# Patient Record
Sex: Female | Born: 1965 | Race: White | Hispanic: No | Marital: Married | Smoking: Former smoker
Health system: Southern US, Community
[De-identification: ages and names within clinical notes are randomized; demographics above are authoritative.]

## PROBLEM LIST (undated history)

## (undated) DIAGNOSIS — D649 Anemia, unspecified: Secondary | ICD-10-CM

## (undated) DIAGNOSIS — E611 Iron deficiency: Secondary | ICD-10-CM

## (undated) DIAGNOSIS — G4733 Obstructive sleep apnea (adult) (pediatric): Secondary | ICD-10-CM

## (undated) DIAGNOSIS — K635 Polyp of colon: Secondary | ICD-10-CM

## (undated) DIAGNOSIS — N200 Calculus of kidney: Secondary | ICD-10-CM

## (undated) DIAGNOSIS — G4730 Sleep apnea, unspecified: Secondary | ICD-10-CM

## (undated) DIAGNOSIS — K529 Noninfective gastroenteritis and colitis, unspecified: Secondary | ICD-10-CM

## (undated) DIAGNOSIS — E669 Obesity, unspecified: Secondary | ICD-10-CM

## (undated) DIAGNOSIS — K469 Unspecified abdominal hernia without obstruction or gangrene: Secondary | ICD-10-CM

## (undated) DIAGNOSIS — I10 Essential (primary) hypertension: Secondary | ICD-10-CM

## (undated) DIAGNOSIS — J45990 Exercise induced bronchospasm: Secondary | ICD-10-CM

## (undated) DIAGNOSIS — Z9989 Dependence on other enabling machines and devices: Secondary | ICD-10-CM

## (undated) DIAGNOSIS — E785 Hyperlipidemia, unspecified: Secondary | ICD-10-CM

## (undated) HISTORY — DX: Obstructive sleep apnea (adult) (pediatric): G47.33

## (undated) HISTORY — DX: Noninfective gastroenteritis and colitis, unspecified: K52.9

## (undated) HISTORY — DX: Dependence on other enabling machines and devices: Z99.89

## (undated) HISTORY — PX: COLONOSCOPY: SHX174

## (undated) HISTORY — DX: Polyp of colon: K63.5

## (undated) HISTORY — DX: Iron deficiency: E61.1

## (undated) HISTORY — DX: Obesity, unspecified: E66.9

## (undated) HISTORY — DX: Hyperlipidemia, unspecified: E78.5

## (undated) HISTORY — PX: ABDOMINOPLASTY: SUR9

## (undated) HISTORY — PX: HERNIA REPAIR: SHX51

## (undated) HISTORY — PX: TONSILECTOMY, ADENOIDECTOMY, BILATERAL MYRINGOTOMY AND TUBES: SHX2538

## (undated) HISTORY — DX: Essential (primary) hypertension: I10

## (undated) HISTORY — DX: Exercise induced bronchospasm: J45.990

## (undated) HISTORY — DX: Unspecified abdominal hernia without obstruction or gangrene: K46.9

## (undated) HISTORY — PX: ABLATION: SHX5711

## (undated) HISTORY — DX: Calculus of kidney: N20.0

## (undated) HISTORY — DX: Sleep apnea, unspecified: G47.30

## (undated) HISTORY — DX: Anemia, unspecified: D64.9

---

## 1999-11-29 ENCOUNTER — Other Ambulatory Visit: Admission: RE | Admit: 1999-11-29 | Discharge: 1999-11-29 | Payer: Self-pay | Admitting: Obstetrics and Gynecology

## 2000-08-03 ENCOUNTER — Encounter: Payer: Self-pay | Admitting: Family Medicine

## 2000-08-03 ENCOUNTER — Encounter: Admission: RE | Admit: 2000-08-03 | Discharge: 2000-08-03 | Payer: Self-pay | Admitting: Family Medicine

## 2001-02-05 ENCOUNTER — Other Ambulatory Visit: Admission: RE | Admit: 2001-02-05 | Discharge: 2001-02-05 | Payer: Self-pay | Admitting: Obstetrics and Gynecology

## 2001-03-07 ENCOUNTER — Encounter: Payer: Self-pay | Admitting: Obstetrics and Gynecology

## 2001-03-07 ENCOUNTER — Ambulatory Visit (HOSPITAL_COMMUNITY): Admission: RE | Admit: 2001-03-07 | Discharge: 2001-03-07 | Payer: Self-pay | Admitting: Obstetrics and Gynecology

## 2001-04-24 ENCOUNTER — Encounter (INDEPENDENT_AMBULATORY_CARE_PROVIDER_SITE_OTHER): Payer: Self-pay | Admitting: Specialist

## 2001-04-24 ENCOUNTER — Ambulatory Visit (HOSPITAL_COMMUNITY): Admission: RE | Admit: 2001-04-24 | Discharge: 2001-04-24 | Payer: Self-pay | Admitting: Obstetrics and Gynecology

## 2002-05-07 ENCOUNTER — Other Ambulatory Visit: Admission: RE | Admit: 2002-05-07 | Discharge: 2002-05-07 | Payer: Self-pay | Admitting: Obstetrics and Gynecology

## 2003-05-09 ENCOUNTER — Other Ambulatory Visit: Admission: RE | Admit: 2003-05-09 | Discharge: 2003-05-09 | Payer: Self-pay | Admitting: Obstetrics and Gynecology

## 2004-06-11 ENCOUNTER — Other Ambulatory Visit: Admission: RE | Admit: 2004-06-11 | Discharge: 2004-06-11 | Payer: Self-pay | Admitting: Obstetrics and Gynecology

## 2004-12-13 ENCOUNTER — Encounter (INDEPENDENT_AMBULATORY_CARE_PROVIDER_SITE_OTHER): Payer: Self-pay | Admitting: Specialist

## 2004-12-13 ENCOUNTER — Ambulatory Visit (HOSPITAL_COMMUNITY): Admission: RE | Admit: 2004-12-13 | Discharge: 2004-12-13 | Payer: Self-pay | Admitting: Specialist

## 2004-12-13 ENCOUNTER — Ambulatory Visit (HOSPITAL_BASED_OUTPATIENT_CLINIC_OR_DEPARTMENT_OTHER): Admission: RE | Admit: 2004-12-13 | Discharge: 2004-12-14 | Payer: Self-pay | Admitting: Specialist

## 2005-04-12 ENCOUNTER — Ambulatory Visit: Payer: Self-pay | Admitting: Family Medicine

## 2006-08-25 ENCOUNTER — Ambulatory Visit: Payer: Self-pay | Admitting: Pulmonary Disease

## 2006-09-21 ENCOUNTER — Ambulatory Visit: Payer: Self-pay | Admitting: Pulmonary Disease

## 2006-09-21 ENCOUNTER — Ambulatory Visit (HOSPITAL_BASED_OUTPATIENT_CLINIC_OR_DEPARTMENT_OTHER): Admission: RE | Admit: 2006-09-21 | Discharge: 2006-09-21 | Payer: Self-pay | Admitting: Pulmonary Disease

## 2006-10-17 ENCOUNTER — Ambulatory Visit: Payer: Self-pay | Admitting: Pulmonary Disease

## 2006-11-28 ENCOUNTER — Ambulatory Visit: Payer: Self-pay | Admitting: Pulmonary Disease

## 2006-12-01 DIAGNOSIS — I10 Essential (primary) hypertension: Secondary | ICD-10-CM | POA: Insufficient documentation

## 2007-03-19 ENCOUNTER — Ambulatory Visit: Payer: Self-pay | Admitting: Family Medicine

## 2007-03-19 DIAGNOSIS — J069 Acute upper respiratory infection, unspecified: Secondary | ICD-10-CM | POA: Insufficient documentation

## 2007-06-12 ENCOUNTER — Ambulatory Visit: Payer: Self-pay | Admitting: Pulmonary Disease

## 2007-06-12 DIAGNOSIS — J45990 Exercise induced bronchospasm: Secondary | ICD-10-CM | POA: Insufficient documentation

## 2007-06-12 DIAGNOSIS — G4733 Obstructive sleep apnea (adult) (pediatric): Secondary | ICD-10-CM | POA: Insufficient documentation

## 2009-09-01 ENCOUNTER — Ambulatory Visit: Payer: Self-pay | Admitting: Pulmonary Disease

## 2009-09-04 ENCOUNTER — Telehealth: Payer: Self-pay | Admitting: Pulmonary Disease

## 2009-09-24 ENCOUNTER — Encounter: Payer: Self-pay | Admitting: Pulmonary Disease

## 2009-10-13 ENCOUNTER — Ambulatory Visit: Payer: Self-pay | Admitting: Pulmonary Disease

## 2010-04-20 NOTE — Assessment & Plan Note (Signed)
Summary: rov//mbw   Visit Type:  Follow-up Primary Provider/Referring Provider:  Dr. Loree Fee, PA-C  CC:  OSA. Last seen 06/12/2007. The patient does c/o mask leakage and air blowing in her eyes.Marland Kitchen  History of Present Illness: I saw [name] in follow up for her OSA on CPAP 9, Exercise induce bronchospasm.  She has been doing okay with CPAP.  Her main problem has been air leaking from her mask and causing dryness in her eyes.  She has a nasal mask.  She is sleeping well, and feels refreshed in the morning.  She is no longer snoring.  She has been getting sluggish during the day, but was recently told she has iron deficient anemia.  She has noticed some more trouble with her breathing when she exercises.  She feels that weather has contributed to this.  She has a cough, clears her throat frequently, and occasional chest tightness.  She gets partial relief from her inhaler, but says when she gets winded it is sometimes difficult to use her inhaler.   Preventive Screening-Counseling & Management  Alcohol-Tobacco     Smoking Status: quit     Year Quit: 1986     Pack years: 2 years x1 ppd  Current Medications (verified): 1)  Lisinopril 20 Mg  Tabs (Lisinopril) .... Take 1 Tablet By Mouth Once A Day 2)  Hydrochlorothiazide 12.5 Mg Caps (Hydrochlorothiazide) .Marland Kitchen.. 1 By Mouth Daily 3)  Vitamin D 1000 Unit Tabs (Cholecalciferol) .... 2 By Mouth Daily 4)  Magnesium 250 Mg Tabs (Magnesium) .Marland Kitchen.. 1 By Mouth Daily 5)  Flax Seed Oil 1000 Mg Caps (Flaxseed (Linseed)) .Marland Kitchen.. 1 By Mouth Daily 6)  Red Yeast Rice 600 Mg Tabs (Red Yeast Rice Extract) .Marland Kitchen.. 1 By Mouth Daily  Allergies (verified): No Known Drug Allergies  Past History:  Past Medical History: Hypertension OSA Exercise induced bronchospasm  Family History: Reviewed history and no changes required. Patient is adopted---no known family history  Social History: Reviewed history and no changes required.  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  Smoked for 2 years, age [age], 1 ppd Smoking Status:  quit Pack years:  2 years x1 ppd  Vital Signs:  Patient profile:   45 year old female Height:      64 inches (162.56 cm) Weight:      259 pounds (117.73 kg) BMI:     44.62 O2 Sat:      97 % on Room air Temp:     98.6 degrees F (37.00 degrees C) oral Pulse rate:   97 / minute BP sitting:   126 / 72  (left arm) Cuff size:   large  Vitals Entered By: Michel Bickers CMA (September 01, 2009 4:46 PM)  O2 Sat at Rest %:  97 O2 Flow:  Room air CC: OSA. Last seen 06/12/2007. The patient does c/o mask leakage and air blowing in her eyes. Comments Medications reviewed. Daytime phone verified. Michel Bickers [facility]  September 01, 2009 4:50 PM   Physical Exam  General:  obese.   Nose:  clear nasal discharge.   Mouth:  no oral lesion Neck:  no JVD.   Lungs:  no wheeze Heart:  regular rhythm and normal rate.   Extremities:  no edema Cervical Nodes:  no significant adenopathy   Impression & Recommendations:  Problem # 1:  OBSTRUCTIVE SLEEP APNEA (ICD-327.23) She has been getting trouble from her mask.  Her refer her to the sleep lab to get her  mask fit checked.  Will also get a copy of her CPAP download.  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  Will refill her proair, and set her up for a home nebulizer.  Problem # 3:  HYPERTENSION (ICD-401.9) Some of her upper airway symptoms could be related to ACE inhibitor use.  I will stop her lisinopril, and start losartan 50 mg once daily.  Medications Added to Medication List This Visit: 1)  Hydrochlorothiazide 12.5 Mg Caps (Hydrochlorothiazide) .Marland Kitchen.. 1 by mouth daily 2)  Vitamin D 1000 Unit Tabs (Cholecalciferol) .... 2 by mouth daily 3)  Magnesium 250 Mg Tabs (Magnesium) .Marland Kitchen.. 1 by mouth daily 4)  Flax Seed Oil 1000 Mg Caps (Flaxseed (linseed)) .Marland Kitchen.. 1 by mouth daily 5)  Red Yeast Rice 600 Mg Tabs (Red  yeast rice extract) .Marland Kitchen.. 1 by mouth daily 6)  Losartan Potassium 50 Mg Tabs (Losartan potassium) .... One by mouth once daily 7)  Proair Hfa 108 (90 Base) Mcg/act Aers (Albuterol sulfate) .... Two puffs up to four times per day as needed 8)  Albuterol Sulfate (2.5 Mg/42ml) 0.083% Nebu (Albuterol sulfate) .... One vial nebulized up to four times per day as needed  Complete Medication List: 1)  Hydrochlorothiazide 12.5 Mg Caps (Hydrochlorothiazide) .Marland Kitchen.. 1 by mouth daily 2)  Vitamin D 1000 Unit Tabs (Cholecalciferol) .... 2 by mouth daily 3)  Magnesium 250 Mg Tabs (Magnesium) .Marland Kitchen.. 1 by mouth daily 4)  Flax Seed Oil 1000 Mg Caps (Flaxseed (linseed)) .Marland Kitchen.. 1 by mouth daily 5)  Red Yeast Rice 600 Mg Tabs (Red yeast rice extract) .Marland Kitchen.. 1 by mouth daily 6)  Losartan Potassium 50 Mg Tabs (Losartan potassium) .... One by mouth once daily 7)  Proair Hfa 108 (90 Base) Mcg/act Aers (Albuterol sulfate) .... Two puffs up to four times per day as needed 8)  Albuterol Sulfate (2.5 Mg/66ml) 0.083% Nebu (Albuterol sulfate) .... One vial nebulized up to four times per day as needed  Other Orders: Est. Patient Level IV (21308) DME Referral (DME) DME Referral (DME) Sleep Disorder Referral (Sleep Disorder)  Patient Instructions: 1)  Will set up nebulizer at home 2)  Use albuterol nebulized one vial up to four times per day as needed  3)  Proair two puffs up to four times per day as needed  4)  Stop lisinopril 5)  Start losartan 50 mg once daily for blood pressure 6)  Will arrange for mask fit evaluation at sleep lab 7)  Will get download from CPAP machine  8)  Follow up in 3 to 4 weeks Prescriptions: ALBUTEROL SULFATE (2.5 MG/3ML) 0.083% NEBU (ALBUTEROL SULFATE) one vial nebulized up to four times per day as needed  #120 x 3   Entered and Authorized by:   Coralyn Helling MD   Signed by:   Coralyn Helling MD on 09/01/2009   Method used:   Electronically to        [facility]* (retail)       [address]       [place], [place]  [id number]       Ph: [phone]       Fax: [phone]   RxID:   [id number] PROAIR HFA 108 (90 BASE) MCG/ACT AERS (ALBUTEROL SULFATE) two puffs up to four times per day as needed  #1 x 3   Entered and Authorized by:   Coralyn Helling MD   Signed by:   Coralyn Helling MD on 09/01/2009  Method used:   Electronically to        [facility]* (retail)       [address]       [place], [place]  [id number]       Ph: [phone]       Fax: [phone]   RxID:   [id number] LOSARTAN POTASSIUM 50 MG TABS (LOSARTAN POTASSIUM) one by mouth once daily  #30 x 3   Entered and Authorized by:   Coralyn Helling MD   Signed by:   Coralyn Helling MD on 09/01/2009   Method used:   Electronically to        [facility]* (retail)       [address]       [place], [place]  [id number]       Ph: [phone]       Fax: [phone]   RxID:   [id number]    Immunization History:  Influenza Immunization History:    Influenza:  historical (12/19/2008)   Prevention & Chronic Care Immunizations   Influenza vaccine: Historical  (12/19/2008)    Tetanus booster: Not documented    Pneumococcal vaccine: Not documented  Other Screening   Pap smear: Not documented    Mammogram: Not documented   Smoking status: quit  (09/01/2009)  Lipids   Total Cholesterol: Not documented   LDL: Not documented   LDL Direct: Not documented   HDL: Not documented   Triglycerides: Not documented  Hypertension   Last Blood Pressure: 126 / 72  (09/01/2009)   Serum creatinine: Not documented   Serum potassium Not documented  Self-Management Support :    Hypertension self-management support: Not documented

## 2010-04-20 NOTE — Miscellaneous (Signed)
Summary: autoCPAP download  Clinical Lists Changes used on 14 of 14 nights with average 8hrs 5 min per night.  Optimal pressure 9 cm H2O with average AHI 2.2.  Minimal leak.  Will continue on CPAP 9 cm H2O. Orders: Added new Referral order of DME Referral (DME) - Signed

## 2010-04-20 NOTE — Assessment & Plan Note (Signed)
Summary: rov 3-4 wks ///kp   Visit Type:  Follow-up Primary Provider/Referring Provider:  Dr. Loree Fee, PA-C  CC:  OSA.  Follow-up on CPAP titration study. .  History of Present Illness: I saw Ms. Boulay in follow up for her OSA on CPAP 9, Exercise induce bronchospasm.  Her breathing has been doing better.  She has not need to use her inhalers.  She is no longer having throat irritation since stopping ACE inhibitor.  She was recent diagnosed with low iron, vitamin D deficiency, and elevated blood sugars.  She was recently treated with a Zpak for a sinus infection.  She developed hives.  She is not sure what triggered this.  She has been on claritin, and this has improved her hives.   Current Medications (verified): 1)  Hydrochlorothiazide 12.5 Mg Caps (Hydrochlorothiazide) .Marland Kitchen.. 1 By Mouth Daily 2)  Magnesium 250 Mg Tabs (Magnesium) .Marland Kitchen.. 1 By Mouth Daily 3)  Flax Seed Oil 1000 Mg Caps (Flaxseed (Linseed)) .Marland Kitchen.. 1 By Mouth Daily 4)  Losartan Potassium 50 Mg Tabs (Losartan Potassium) .... One By Mouth Once Daily 5)  Proair Hfa 108 (90 Base) Mcg/act Aers (Albuterol Sulfate) .... Two Puffs Up To Four Times Per Day As Needed 6)  Albuterol Sulfate (2.5 Mg/43ml) 0.083% Nebu (Albuterol Sulfate) .... One Vial Nebulized Up To Four Times Per Day As Needed 7)  Loratadine 10 Mg Tabs (Loratadine) .... 2 By Mouth Daily  Allergies (verified): No Known Drug Allergies  Past History:  Past Medical History: Hypertension OSA      - CPAP 9 cm Exercise induced bronchospasm ACE inhibitor cough Glucose intolerance Vitamin D deficiency Iron deficiency  Vital Signs:  Patient profile:   45 year old female Height:      64 inches (162.56 cm) Weight:      261.38 pounds (118.81 kg) BMI:     45.03 O2 Sat:      97 % on Room air Temp:     97.4 degrees F (36.33 degrees C) oral Pulse rate:   84 / minute BP sitting:   140 / 78  (left arm) Cuff size:   large  Vitals Entered By: Michel Bickers CMA  (October 13, 2009 4:45 PM)  O2 Sat at Rest %:  97 O2 Flow:  Room air CC: OSA.  Follow-up on CPAP titration study.  Comments Medications reviewed with the patient. Daytime phone verifeid. Michel Bickers [facility]  October 13, 2009 4:46 PM   Physical Exam  General:  obese.   Nose:  clear nasal discharge.   Mouth:  no oral lesion Neck:  no JVD.   Lungs:  no wheeze Heart:  regular rhythm and normal rate.   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  Problem # 2:  OBSTRUCTIVE SLEEP APNEA (ICD-327.23)  Her recent CPAP download showed good control of her sleep apnea with CPAP 9 cm.  Problem # 3:  HYPERTENSION (ICD-401.9)  Her upper throat irritation has improved since stopping ACE inhibitor.  She is to follow up with primary care for further management of her blood pressure.  Medications Added to Medication List This Visit: 1)  Loratadine 10 Mg Tabs (Loratadine) .... 2 by mouth daily  Complete Medication List: 1)  Hydrochlorothiazide 12.5 Mg Caps (Hydrochlorothiazide) .Marland Kitchen.. 1 by mouth daily 2)  Magnesium 250 Mg Tabs (Magnesium) .Marland Kitchen.. 1 by mouth daily 3)  Flax Seed Oil 1000 Mg Caps (Flaxseed (linseed)) .Marland Kitchen.. 1 by mouth daily 4)  Losartan Potassium 50 Mg Tabs (Losartan potassium) .... One by mouth once daily 5)  Proair Hfa 108 (90 Base) Mcg/act Aers (Albuterol sulfate) .... Two puffs up to four times per day as needed 6)  Albuterol Sulfate (2.5 Mg/63ml) 0.083% Nebu (Albuterol sulfate) .... One vial nebulized up to four times per day as needed 7)  Loratadine 10 Mg Tabs (Loratadine) .... 2 by mouth daily  Other Orders: Est. Patient Level III (57846)  Patient Instructions: 1)  Follow up in 6 months

## 2010-04-20 NOTE — Progress Notes (Signed)
Summary: cpap download  Phone Note From Other Clinic Call back at [phone]   Caller: Buford Dresser, RT [facility] Call For: Dr. Craige Cotta Summary of Call: Mardelle Matte with [facility] called and stated that Mrs. Rupnow has a Therapist, music M-seriers unit and they are not Designer, industrial/product. They would be able to pull a compliance report only.  Mardelle Matte states that he has an Research scientist (life sciences) M-seriers that can be loaned to the patient for 2 or 3 weeks and them perform the download if this is acceptable.  If this is ok, I will need an order to" provide pt with a Loaner downloadable CPAP at pt's current pressure x (2 wks or however long you would like) download and fax." If you just want a compliance report that can be pulled off her machine. Please advise. Thanks, Initial call taken by: Alfonso Ramus,  September 04, 2009 11:54 AM  Follow-up for Phone Call        May be best to do auto cpap titration for 2 weeks instead.  Will send order for this. Follow-up by: Coralyn Helling MD,  September 05, 2009 9:34 AM     Appended Document: cpap download cpap auto range 5-20 cwp x 2 wks download to Dr. Craige Cotta faxed to [facility], Attn: Buford Dresser, RT

## 2010-04-22 ENCOUNTER — Other Ambulatory Visit: Payer: Self-pay | Admitting: Obstetrics and Gynecology

## 2010-08-03 NOTE — Assessment & Plan Note (Signed)
[facility]                             PULMONARY OFFICE NOTE   MARKEITA, Jenny Singleton                MRN:          [id number]  DATE:08/25/2006                            DOB:          03-13-1966    SLEEP CONSULTATION:  Patient is a self-referral.   I met Jenny Singleton today for evaluation of her sleep difficulties.  She  has been having problems with snoring for at least the last one year and  her husband says that things are getting worse.  She also complains of  feeling tired during the day.  She has an Epworth score of 10/24.  She  typically will go to bed between 10 and 11 o'clock at night, although  she often-times will fall asleep while watching TV before this.  She has  no trouble falling asleep.  She wakes up once or twice during the night  to use the bathroom and then goes back to sleep.  She wakes up at 7  o'clock in the morning, but still feels quite tired.  She denies having  headaches in the morning.  She tends to sleep more on her side.  She  will occasionally wake up with a coughing sensation and has had episodes  in which she has woken up feeling sweaty.  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  She does  have congestion in her nose and has tried using a BreatheRight strip to  help with her snoring, but did not get any benefit from this.  She has  no history of nightmares or night terrors.  There is no history of sleep-  walking or sleep-talking.  She denies any history of restless leg  syndrome.  There is also no history of sleep hallucinations, sleep  paralysis or cataplexy.  She is not currently using anything to help her  fall asleep a night or stay awake during the day.   PAST MEDICAL HISTORY:  Significant for hypertension.  She has also had  bronchospastic episodes, associated with episodes of bronchitis.   PAST SURGICAL HISTORY:  Significant for tonsillectomy,  oophorectomy  unilaterally and a tummy tuck.   CURRENT MEDICATIONS:  Lisinopril 20 mg daily.   ALLERGIES:  She has no known drug allergies.   SOCIAL HISTORY:  She is married, she has two children.  She works as an  Airline pilot.  She smoked for two years, one pack of cigarettes per day,  but quit at the age of 40.  There is no history of alcohol use.   FAMILY HISTORY:  The patient is adopted.   REVIEW OF SYSTEMS:  She complains of getting a rattling noise in her  chest with some chest tightness when she is exercising.  She says that  this happens usually when she has initiated her exercise regimen and she  is exercising at a fairly brisk pace.   PHYSICAL EXAM:  She is 5 feet 5 inches tall, 253 pounds, temperature is  99.3, blood pressure is 118/80, heart rate is 80, oxygen saturation is  96% on room air.  HEENT:  Pupils reactive.  Extraocular muscles are intact.  There is no  sinus tenderness.  She has narrow nasal angles.  She has a Mallampati 3  airway with a low-lying soft palate.  There is no lymphadenopathy, no  thyromegaly.  HEART:  S1, S2, regular rhythm.  CHEST:  Clear to auscultation.  ABDOMEN:  Soft, nontender, positive bowel sounds.  EXTREMITIES:  There was no edema, cyanosis or clubbing.  NEUROLOGIC EXAM:  No focal deficits were appreciated.   IMPRESSION:  1. Sleep disruption with excessive daytime sleepiness, combined with      snoring and a history of hypertension and obesity.  I am concerned      that she may have some degree of sleep disordered breathing.  To      further evaluate this, I will schedule her for an overnight      polysomnogram.  In the meantime, I have discussed with her the      importance of maintaining her diet, exercise and weight-reduction      program.  I have also reviewed with her the importance of avoiding      alcohol and sedatives.  Driving precautions were discussed with      her, as well.  I had also explained to her the diagnosis of  sleep      apnea and the possible association with sleep apnea and increased      risk of hypertension, coronary artery disease, cerebrovascular      disease, diabetes and arrhythmias.  2. Possible exercise-induced bronchospasm.  I discussed with her      techniques with regards to doing an adequate warm-up period before      initiating her high-intensity exercise regimen.  I had also      suggested to her that she try using her albuterol inhaler to see if      this improves her exercise tolerance.  Depending upon how      persistent these symptoms are and her response to the above      interventions, further evaluation for this may be necessary.   I will follow up with her after I have a chance to review her sleep  study.     Coralyn Helling, MD  Electronically Signed    VS/MedQ  DD: 08/25/2006  DT: 08/25/2006  Job #: [id number]

## 2010-08-03 NOTE — Procedures (Signed)
NAME:  Jenny Singleton, Jenny Singleton NO.:  [id number]   MEDICAL RECORD NO.:  [id number]          PATIENT TYPE:  OUT   LOCATION:  SLEEP CENTER                 [facility]AC[facility]   PHYSICIAN:  Coralyn Helling, MD        DATE OF BIRTH:  14-Nov-1965   DATE OF STUDY:  09/21/2006                            NOCTURNAL POLYSOMNOGRAM   REFERRING PHYSICIAN:  Coralyn Helling, MD   INDICATION FOR STUDY:  This is a patient who has a history of  hypertension, daytime fatigue and sleep disruption.  She is referred to  the sleep lab for evaluation of obstructive sleep apnea.   EPWORTH SLEEPINESS SCORE:  Is 8.   MEDICATIONS:  Lisinopril.   SLEEP ARCHITECTURE:  Total recording time is 355 minutes.  Total sleep  time was 263 minutes.  Sleep deficiency was 74%, which is reduced.  Sleep latency was 31 minutes, which is prolonged.  REM latency was 200  minutes, which is prolonged.  It appeared that the patient had  difficulty with sleep initiation and sleep maintenance due to  respiratory events.  The patient was observed in all stages of sleep,  but there was a reduction in the percentage of slow waves to 3% of the  study and REM sleep to 13% of the study.  The patient slept  predominantly in the non-supine position.   RESPIRATORY DATA:  The average respiratory rate was 18.  The overall  apnea/hypopnea index was 46.7.  The events were exclusively obstructive  in nature.  The REM apnea/hypopnea index was 19.  The non-REM  apnea/hypopnea index was 51.  The supine apnea/hypopnea index was 91.  The non-supine apnea/hypopnea index was 39.  Loud snoring was noted by  the technician.  The patient was scheduled for a split night study;  however, she did not meet protocol criteria due to insufficient sleep  during the first half of the study.   OXYGEN DATA:  The baseline oxygenation was 97%.  The oxygen saturation  Nadir was 82%.  The patient spent a total of 57 minutes with an oxygen  saturation to an 81%-90%.   The remainder of the test time was spent with  an oxygen saturation period of 91%.   CARDIAC DATA:  The average heart rate was 74 and the rhythm strip showed  a normal sinus rhythm with PACs.   MOVEMENT-PARASOMNIA:  The periodic limb movement index was 0.  The  patient was noted by the technician to be clenching her teeth at times  while she was asleep.   IMPRESSIONS-RECOMMENDATIONS:  This study shows evidence for severe  obstructive sleep apnea, as demonstrated by an apnea/hypopnea index of  46.7, with an oxygen saturation Nadir of 82%, in conjunction with  diet, exercise and weight loss.  A strong consideration should be given  to having the patient return to the sleep lab for a CPAP titration  study.      Coralyn Helling, MD  Diplomat, American Board of Sleep Medicine  Electronically Signed     VS/MEDQ  D:  10/04/2006 06:37:10  T:  10/04/2006 14:44:47  Job:  [id number]

## 2010-08-03 NOTE — Assessment & Plan Note (Signed)
[facility]                             PULMONARY OFFICE NOTE   Jenny Singleton, Jenny Singleton                     MRN:          [id number]  DATE:11/28/2006                            DOB:          August 23, 1965    I saw [name] in followup today for her severe obstructive sleep  apnea as well as her possible exercise-induced bronchospasm.   With regards to her sleep apnea, she had an auto CPAP titration study  done from August 4 to August 28, and she had 100% usage with an average  nightly usage of 6-1/2 hours. Her apnea/hypopnea index was reduced to  2.6, and her 90th percentile pressure setting was 9 cm of water, and she  had minimal air leak.   She is currently using a nasal mask with heated humidification and says  that she is doing quite well with this. She is having no troubles  sleeping at night, and she is also feeling increased energy level during  the day. She denies any problems as far as nasal congestion or dryness  or mouth dryness. She says she has to occasionally shift her mask in the  middle of the night, but this happens only sporadically.   She says her breathing is doing reasonably well. She has had several  episodes over the last few years around the change of the seasons with  Fall where she gets itchy eyes and watery eyes as well as nasal  congestion which then goes on to develop difficulty with her breathing  with the symptom description consistent with asthmatic bronchitis. What  I have advised for her to do with this is to use over-the-counter  antihistamines as needed as well as nasal irrigation, and then I have  advised her to contact me if her symptoms were to worsen to further  evaluate whether she would need to be on any inhaler agents. In  addition, I would decide then if she would need to have further  pulmonary functional testing done, but I do not think she would need to  have that done at the present time.   Otherwise, with regard to her sleep apnea, I will continue her on CPAP  at 9 cm of water, and I have encouraged her to maintain her compliance  with this. I have also encouraged her to maintain her compliance with  her exercise regimen with the hopes that if she is able to have  significant weight loss we will be able to modify her CPAP therapy.   I will follow up with her in about 4 to 6 months.     Coralyn Helling, MD  Electronically Signed    VS/MedQ  DD: 11/28/2006  DT: 11/29/2006  Job #: [id number]

## 2010-08-03 NOTE — Assessment & Plan Note (Signed)
[facility]                             PULMONARY OFFICE NOTE   EMRIE, GAYLE                     MRN:          [id number]  DATE:10/17/2006                            DOB:          07/22/1965    I saw Ms. Wikerson today in followup for her sleep apnea.   She had undergone an overnight polysomnogram on July 3rd and this showed  severe obstructive sleep apnea with an apnea-hypopnea index of 46.7 and  an oxygen saturation nadir of 82%. She did not meet the split-night  study protocol criteria due to insufficient sleep during the first half  of the test.   I have reviewed the results of the sleep study with her. I reviewed  various treatment options for her sleep apnea including CPAP therapy  with appliance and surgical intervention. Given the severity of her  sleep apnea, I would recommend CPAP therapy. I will therefore make  arrangements for her to undergo an auto CPAP titration study for 2  weeks. If she is still having difficulty after this then we will make  arrangements for her to have a in lab sleep CPAP titration study. In the  meantime, I have emphasized to her the importance of continuing with her  diet, exercise and weight reduction program.   At her followup I will also check to see if she is having any problems  with nasal congestion as well as to see if her breathing symptoms are  related to possible exercise-induced bronchospasm.   I will followup with her in approximately 6-8 weeks.     Coralyn Helling, MD  Electronically Signed    VS/MedQ  DD: 10/17/2006  DT: 10/18/2006  Job #: [id number]

## 2010-08-06 NOTE — Op Note (Signed)
[facility]  Patient:    Jenny Singleton, Jenny Singleton Visit Number: [id number] MRN: [id number]          Service Type: DSU Location: [facility] Attending Physician:  Osborn Coho Dictated by:   Janeece Riggers Dareen Piano, M.D. Proc. Date: 04/24/01 Admit Date:  04/24/2001 Discharge Date: 04/24/2001                             Operative Report  PREOPERATIVE DIAGNOSIS:       A 9 cm pelvic mass.  POSTOPERATIVE DIAGNOSIS:      A 9 cm pelvic mass with likely paratubal cyst.  OPERATION:                    Diagnostic laparoscopy with right                               salpingo-oophorectomy.  SURGEON:                      Mark E. Dareen Piano, M.D.  ASSISTANT:                    Luvenia Redden, M.D.  ANESTHESIA:                   General endotracheal.  ANTIBIOTICS:                  Ancef 1 g.  DRAINS:                       Red rubber catheter to bladder.  ESTIMATED BLOOD LOSS:         30 cc.  COMPLICATIONS:                None.  SPECIMENS:                    Right fallopian tube and ovary sent to                               pathology.  DESCRIPTION OF PROCEDURE:     The patient was taken to the operating room where she was placed in the dorsal supine position.  A general anesthetic was administered without complications.  She was then placed in the dorsolithotomy position and prepped with Hibiclens.  Her bladder was drained with a red rubber catheter.  A Hulka tenaculum was applied to the anterior cervical lip. Ten cubic centimeters of 0.25% Marcaine was then injected into the umbilicus. There was a vertical skin incision made in the umbilicus.  The fascia was then grasped and entered sharply.  The parietal peritoneum was entered bluntly; 0 Vicryl sutures were placed in the fascia, and the Hasson cannula was placed into the abdominal cavity.  Three liters of carbon dioxide was then insufflated.  Next, the scope was then placed.  A 5 mm port was then placed  in the suprapubic region under direct visualization.  At this point, the patient was noted to have a normal-appearing uterus.  Fallopian tubes were normal bilaterally.  The right ovary appeared to have a small 2 cm cyst in it consistent with a follicular cyst.  There was also a large 9 cm mass which was in the broad ligament with a question of  whether it was adherent to the ovary or not.  The left fallopian ovary was normal.  There was no evidence of pelvic adhesions or endometriosis.  The large cystic mass was in the pelvis causing the uterus to be deflected ________ the pelvis and anterior.  Once the pelvic mass was lifted, the Klepinger was used to cauterize the infundibulopelvic ligament, which was then transected.  The ureter was easily identified and far from the field of operation.  Next, the fallopian tube was cauterized and transected.  The ovarian ligament was cauterized and transected.  The large cyst was then punctured with an aspiration needle and 600 cc of clear fluid withdrawn.  Once the sac was deflated, the specimen was placed in the posterior cul-de-sac.  A 10 mm trocar was then placed in the suprapubic region.  An Endobag was placed in the abdominal cavity, and the large cyst was removed without difficulty.  A second Endobag was then placed into the abdominal cavity, and the ovary placed in the Endobag, and brought through the abdominal wall.  Once this was accomplished, the pelvis was copiously irrigated, and felt to be hemostatic.  This concluded the procedure.  The instruments were removed and pneumoperitoneum released, and the fascia closed with interrupted 0 Vicryl suture.  The skin was closed with 4-0 Vicryl suture in a subcuticular fashion. Steri-Strips were placed on the suprapubic incision.  The patient tolerated the procedure well.  She will be discharged to home with Demerol to take p.r.n. along with Phenergan and Keflex 500 mg q.i.d. for three days.  She  will follow up in the office in four weeks. Dictated by:   Janeece Riggers Dareen Piano, M.D. Attending Physician:  Osborn Coho DD:  04/25/01 TD:  04/26/01 Job: [id number]

## 2010-08-06 NOTE — Op Note (Signed)
NAME:  Jenny Singleton, Jenny Singleton         ACCOUNT NO.:  [id number]   MEDICAL RECORD NO.:  [id number]          PATIENT TYPE:  AMB   LOCATION:  DSC                          FACILITY:  [facility]   PHYSICIAN:  Earvin Hansen L. 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 She has had two children, has 4+  diastasis recti. She has lost approximately 60 pounds, is now being prepared  for panniculectomy with repair of diastasis recti, liposuction of the  ischial areas and upper abdomen, limited.   ANESTHESIA:  General.   Preoperatively, the patient was stood up and drawn for the midline of the  abdomen as well as the W-plasty incision. Lipodystrophy areas were also  outlined in circles. She then underwent general anesthesia and intubated  orally. Prep was done to the breast/belly/thigh/ischial areas, back, and  inguinal thigh areas with Hibiclens soap and solution, walled off with  sterile towels, and draped so as to make a sterile field. The patient  underwent general anesthesia, intubated orally. A Foley catheter was placed  in a sterile technique. Tumescent was injected throughout the whole areas of  dissection of 700 mL total. The incisions were opened with #15 blades and  the Bovie was used to go down through Scarpa's fashion to underlying areolar  tissue overlying the fascia. Vessels were coagulated with the Bovie unit on  coagulation. Next, a flap was brought all the way up to the umbilicus. The  umbilicus was excised and left on its pedicle. Then, the dissection was  carried up to the xiphoid process as well as the right and left upper  quadrants. Out laterally, tissue was also dissected. The perforators were  controlled with the Bovie unit on coagulation throughout the dissection.  After proper hemostasis, the patient was placed in a jackknife position,  excess skin trimmed appropriately on the large flaps from the lower belly  from the umbilicus down, removing over approximately 3000 g per side, with a  total of approximately 6000 g. After proper hemostasis, liposuction was then  fashioned in the ischial areas, limited to the upper abdomen and lower  abdomen to improve symmetry. Next, the diastasis recti was repaired with a  running #1 Prolene from the xiphoid process to the umbilicus, and from the  umbilicus down to the pubic area. After this, the flaps were closed in  layers with 2-0 Monocryl x2 layers in the subcutaneous plane, then a running  subcuticular stitch of 3-0 Monocryl to the skin. The new opening was made  for the umbilicus. That area was trimmed appropriately and it was brought  back through and secured with 2-0 Monocryl subcutaneously and a running 4-0  Prolene. The wounds were drained with a large Hemovac drain which was placed  through the pubic area in the right and left abdomen and secured with 3-0  Prolene. The wounds were cleansed. At the end of the procedure the blood  supply to all the  flaps looked great, there was no discoloration. Steri-  Strips and soft dressings were applied to all the areas. She withstood the  procedures very well. Abdominal binder was also placed after the dressings  were placed. She was then taken to recovery in excellent condition.   ESTIMATED BLOOD LOSS:  Less than 150 mL.   COMPLICATIONS:  None.      Jenny Singleton. Shon Hough, M.D.  Electronically Signed     GLT/MEDQ  D:  12/13/2004  T:  12/14/2004  Job:  [id number]

## 2010-08-17 ENCOUNTER — Other Ambulatory Visit: Payer: Self-pay | Admitting: Internal Medicine

## 2010-08-17 ENCOUNTER — Telehealth: Payer: Self-pay | Admitting: Pulmonary Disease

## 2010-08-17 DIAGNOSIS — G4733 Obstructive sleep apnea (adult) (pediatric): Secondary | ICD-10-CM

## 2010-08-17 DIAGNOSIS — R748 Abnormal levels of other serum enzymes: Secondary | ICD-10-CM

## 2010-08-17 NOTE — Telephone Encounter (Signed)
PT STATES SHE CONTACTED [facility] AND THEY TOLD HER SHE WAS DUE FOR A NEW CPAP MACHINE BECAUSE HERS IS  45 YEARS OLD PT WAS ALSO IF DR SOOD SAID THAT THIS WOULD BE AN ONGOING NEED THIS COULD BE PURCHASED THROUGH HER INSURANCE COMPANY INSTEAD OF A RENTAL. DR SOOD IF YOU AGREE PLS SEND ORDER TO [facility] FOR A NEW MACHINE WITH [facility].

## 2010-08-18 NOTE — Telephone Encounter (Signed)
Pt aware and appt set for 09-02-10 at 4:15.Carron Curie, CMA

## 2010-08-18 NOTE — Telephone Encounter (Signed)
Will send order to [facility] to arrange for new CPAP machine.  Will have my nurse call to inform patient of plan, and advise that she is overdue for follow up visit.  She will need to schedule ROV to review status of sleep apnea.

## 2010-08-19 ENCOUNTER — Ambulatory Visit
Admission: RE | Admit: 2010-08-19 | Discharge: 2010-08-19 | Disposition: A | Discharge status: Home / Self Care | Payer: 59 | Source: Ambulatory Visit | Attending: Internal Medicine | Admitting: Internal Medicine

## 2010-08-19 DIAGNOSIS — R748 Abnormal levels of other serum enzymes: Secondary | ICD-10-CM

## 2010-08-30 ENCOUNTER — Encounter: Payer: Self-pay | Admitting: Pulmonary Disease

## 2010-09-02 ENCOUNTER — Encounter: Payer: Self-pay | Admitting: Pulmonary Disease

## 2010-09-02 ENCOUNTER — Ambulatory Visit (INDEPENDENT_AMBULATORY_CARE_PROVIDER_SITE_OTHER): Payer: 59 | Admitting: Pulmonary Disease

## 2010-09-02 DIAGNOSIS — E669 Obesity, unspecified: Secondary | ICD-10-CM

## 2010-09-02 DIAGNOSIS — J45990 Exercise induced bronchospasm: Secondary | ICD-10-CM

## 2010-09-02 DIAGNOSIS — G4733 Obstructive sleep apnea (adult) (pediatric): Secondary | ICD-10-CM

## 2010-09-02 NOTE — Assessment & Plan Note (Signed)
Stable.  She can use her inhaler as needed.

## 2010-09-02 NOTE — Assessment & Plan Note (Signed)
Encouraged her to continue with her diet and exercise regimen.

## 2010-09-02 NOTE — Assessment & Plan Note (Signed)
She is doing well with CPAP.  

## 2010-09-02 NOTE — Patient Instructions (Signed)
Follow-up in one year.

## 2010-09-02 NOTE — Progress Notes (Signed)
Subjective:    Patient ID: Jenny Singleton, female    DOB: [birth year], 45 y.o.   MRN: [id number]  HPI 45 yo female with OSA, and exercise induced asthma.  She has been doing well with CPAP.  She got a new nasal mask, and likes this better.  She feels CPAP helps with her sleep and energy level.  She uses this on a regular basis.  She is not having any trouble with her asthma.  She has not needed to use her inhaler for several months.  She is exercising on a regular basis, but has not been able to lose much weight.  Past Medical History  Diagnosis Date  . Hypertension   . OSA on CPAP   . Vitamin D deficiency   . Iron deficiency   . Exercise-induced asthma   . Diabetes mellitus      History reviewed. No pertinent family history.   History   Social History  . Marital Status: Married    Spouse Name: N/A    Number of Children: N/A  . Years of Education: N/A   Occupational History  . Not on file.   Social History Main Topics  . Smoking status: Former Smoker -- 1.0 packs/day for 2 years    Quit date: 03/21/1985  . Smokeless tobacco: Not on file   Comment: smoked age 5-19  . Alcohol Use: Not on file  . Drug Use: Not on file  . Sexually Active: Not on file   Other Topics Concern  . Not on file   Social History Narrative  . No narrative on file     No Known Allergies   Outpatient Prescriptions Prior to Visit  Medication Sig Dispense Refill  . albuterol (PROAIR HFA) 108 (90 BASE) MCG/ACT inhaler Inhale 2 puffs into the lungs every 6 (six) hours as needed.        Marland Kitchen albuterol (PROVENTIL) (2.5 MG/3ML) 0.083% nebulizer solution Take 2.5 mg by nebulization every 6 (six) hours as needed.        . Flaxseed, Linseed, (FLAX SEED OIL) 1000 MG CAPS Take 1 capsule by mouth daily.        . hydrochlorothiazide (HYDRODIURIL) 12.5 MG tablet Take 12.5 mg by mouth daily.        Marland Kitchen loratadine (CLARITIN) 10 MG tablet Take 10 mg by mouth daily. As needed      . Magnesium 250 MG TABS Take 1  tablet by mouth daily.        Marland Kitchen losartan (COZAAR) 50 MG tablet Take 50 mg by mouth daily.            Review of Systems     Objective:   Physical Exam  BP 120/84  Pulse 91  Temp(Src) 98.4 F (36.9 C) (Oral)  Ht 5\' 4"  (1.626 m)  Wt 260 lb (117.935 kg)  BMI 44.63 kg/m2  SpO2 95%  General - Obese HEENT - no sinus tenderness, no oral lesion, no LAN Cardiac - s1s2 regular, no murmur Chest - no wheeze/rales Abd - soft, nontender Ext - no edema Neuro - normal strength, CN intact Psych - normal mood/behavior     Assessment & Plan:   OBSTRUCTIVE SLEEP APNEA She is doing well with CPAP.  Exercise induced bronchospasm Stable.  She can use her inhaler as needed.  Obesity Encouraged her to continue with her diet and exercise regimen.    Updated Medication List Outpatient Encounter Prescriptions as of 09/02/2010  Medication Sig Dispense Refill  . albuterol (  PROAIR HFA) 108 (90 BASE) MCG/ACT inhaler Inhale 2 puffs into the lungs every 6 (six) hours as needed.        Marland Kitchen albuterol (PROVENTIL) (2.5 MG/3ML) 0.083% nebulizer solution Take 2.5 mg by nebulization every 6 (six) hours as needed.        . Cholecalciferol (VITAMIN D) 2000 UNITS CAPS Take 1 capsule by mouth daily.        . ferrous sulfate 325 (65 FE) MG tablet Take 325 mg by mouth 2 (two) times daily.        . fish oil-omega-3 fatty acids 1000 MG capsule Take by mouth daily.        . Flaxseed, Linseed, (FLAX SEED OIL) 1000 MG CAPS Take 1 capsule by mouth daily.        . hydrochlorothiazide (HYDRODIURIL) 12.5 MG tablet Take 12.5 mg by mouth daily.        Marland Kitchen lisinopril (PRINIVIL,ZESTRIL) 20 MG tablet Take 20 mg by mouth daily.        Marland Kitchen loratadine (CLARITIN) 10 MG tablet Take 10 mg by mouth daily. As needed      . Magnesium 250 MG TABS Take 1 tablet by mouth daily.        . metFORMIN (GLUCOPHAGE) 500 MG tablet Take 500 mg by mouth 2 (two) times daily with a meal.        . milk thistle 175 MG tablet Take 175 mg by mouth 2  (two) times daily.        Marland Kitchen DISCONTD: losartan (COZAAR) 50 MG tablet Take 50 mg by mouth daily.

## 2011-03-03 ENCOUNTER — Other Ambulatory Visit: Payer: Self-pay | Admitting: Obstetrics and Gynecology

## 2011-08-03 ENCOUNTER — Ambulatory Visit (HOSPITAL_COMMUNITY)
Admission: RE | Admit: 2011-08-03 | Discharge: 2011-08-03 | Disposition: A | Discharge status: Home / Self Care | Payer: BC Managed Care – PPO | Source: Ambulatory Visit | Attending: Internal Medicine | Admitting: Internal Medicine

## 2011-08-03 ENCOUNTER — Other Ambulatory Visit (HOSPITAL_COMMUNITY): Payer: Self-pay | Admitting: Internal Medicine

## 2011-08-03 DIAGNOSIS — M25569 Pain in unspecified knee: Secondary | ICD-10-CM

## 2012-02-20 ENCOUNTER — Other Ambulatory Visit: Payer: Self-pay | Admitting: Internal Medicine

## 2012-02-20 DIAGNOSIS — R1011 Right upper quadrant pain: Secondary | ICD-10-CM

## 2012-02-23 ENCOUNTER — Ambulatory Visit
Admission: RE | Admit: 2012-02-23 | Discharge: 2012-02-23 | Disposition: A | Discharge status: Home / Self Care | Payer: BC Managed Care – PPO | Source: Ambulatory Visit | Attending: Internal Medicine | Admitting: Internal Medicine

## 2012-02-23 DIAGNOSIS — R1011 Right upper quadrant pain: Secondary | ICD-10-CM

## 2012-02-24 ENCOUNTER — Other Ambulatory Visit (HOSPITAL_COMMUNITY): Payer: Self-pay | Admitting: Internal Medicine

## 2012-02-24 DIAGNOSIS — R1011 Right upper quadrant pain: Secondary | ICD-10-CM

## 2012-03-02 ENCOUNTER — Encounter (HOSPITAL_COMMUNITY)
Admission: RE | Admit: 2012-03-02 | Discharge: 2012-03-02 | Disposition: A | Discharge status: Home / Self Care | Payer: BC Managed Care – PPO | Source: Ambulatory Visit | Attending: Internal Medicine | Admitting: Internal Medicine

## 2012-03-02 DIAGNOSIS — R1011 Right upper quadrant pain: Secondary | ICD-10-CM | POA: Insufficient documentation

## 2012-03-02 MED ORDER — SINCALIDE 5 MCG IJ SOLR
INTRAMUSCULAR | Status: AC
  Filled 2012-03-02: qty 5

## 2012-03-02 MED ORDER — TECHNETIUM TC 99M MEBROFENIN IV KIT
5.0000 | PACK | Freq: Once | INTRAVENOUS | Status: AC | PRN
  Administered 2012-03-02: 5 via INTRAVENOUS

## 2012-03-02 MED ORDER — SINCALIDE 5 MCG IJ SOLR
0.0200 ug/kg | Freq: Once | INTRAMUSCULAR | Status: AC
  Administered 2012-03-02: 5.68 ug via INTRAVENOUS

## 2012-03-02 MED ORDER — STERILE WATER FOR INJECTION IJ SOLN
10.0000 mL | Freq: Once | INTRAMUSCULAR | Status: AC
  Administered 2012-03-02: 10 mL via INTRAMUSCULAR
  Filled 2012-03-02: qty 10

## 2012-07-05 ENCOUNTER — Ambulatory Visit
Admission: RE | Admit: 2012-07-05 | Discharge: 2012-07-05 | Disposition: A | Discharge status: Home / Self Care | Payer: BC Managed Care – PPO | Source: Ambulatory Visit | Attending: Internal Medicine | Admitting: Internal Medicine

## 2012-07-05 ENCOUNTER — Other Ambulatory Visit: Payer: Self-pay | Admitting: Internal Medicine

## 2012-07-05 DIAGNOSIS — K469 Unspecified abdominal hernia without obstruction or gangrene: Secondary | ICD-10-CM

## 2012-07-05 MED ORDER — IOHEXOL 300 MG/ML  SOLN
125.0000 mL | Freq: Once | INTRAMUSCULAR | Status: AC | PRN
  Administered 2012-07-05: 125 mL via INTRAVENOUS

## 2012-07-09 ENCOUNTER — Telehealth: Payer: Self-pay | Admitting: Internal Medicine

## 2012-07-09 ENCOUNTER — Encounter: Payer: Self-pay | Admitting: *Deleted

## 2012-07-09 NOTE — Telephone Encounter (Signed)
Informed Darel Hong that pt may see Doug Sou, PA at 0830am tomorrow. Darel Hong will fax labs and ofc notes and inform the pt.

## 2012-07-10 ENCOUNTER — Other Ambulatory Visit: Payer: BC Managed Care – PPO

## 2012-07-10 ENCOUNTER — Encounter: Payer: Self-pay | Admitting: Gastroenterology

## 2012-07-10 ENCOUNTER — Ambulatory Visit (INDEPENDENT_AMBULATORY_CARE_PROVIDER_SITE_OTHER): Payer: BC Managed Care – PPO | Admitting: Gastroenterology

## 2012-07-10 VITALS — BP 128/76 | HR 92 | Ht 64.0 in | Wt 245.0 lb

## 2012-07-10 DIAGNOSIS — R109 Unspecified abdominal pain: Secondary | ICD-10-CM

## 2012-07-10 DIAGNOSIS — R140 Abdominal distension (gaseous): Secondary | ICD-10-CM

## 2012-07-10 DIAGNOSIS — K760 Fatty (change of) liver, not elsewhere classified: Secondary | ICD-10-CM

## 2012-07-10 DIAGNOSIS — D509 Iron deficiency anemia, unspecified: Secondary | ICD-10-CM

## 2012-07-10 DIAGNOSIS — R7401 Elevation of levels of liver transaminase levels: Secondary | ICD-10-CM

## 2012-07-10 DIAGNOSIS — R143 Flatulence: Secondary | ICD-10-CM

## 2012-07-10 DIAGNOSIS — K7689 Other specified diseases of liver: Secondary | ICD-10-CM

## 2012-07-10 DIAGNOSIS — R141 Gas pain: Secondary | ICD-10-CM

## 2012-07-10 MED ORDER — PEG-KCL-NACL-NASULF-NA ASC-C 100 G PO SOLR: 1.0000 | Freq: Once | ORAL | Status: DC

## 2012-07-10 NOTE — Progress Notes (Signed)
07/10/2012 Jenny Singleton [id number] [birth year]   HISTORY OF PRESENT ILLNESS:  Patient is a pleasant 47 year old female who presents to our office day as a new patient.  She was sent to our office by her PCP for evaluation of a few issues. First, she has elevated LFTs. She reports that they have been elevated for the past year or 2 to her knowledge. She has been taking milk thistle at the recommendation of her family doctor, and has been told that the elevation is secondary to her fatty liver disease. Her most recent LFTs that were drawn last week show a normal alkaline phosphatase at 45, a normal total bilirubin at 0.7, AST of 71, and ALT of 92. She had a CAT scan recently for evaluation of abdominal pain and bloating, which revealed an enlarged liver spanning over 23 cm with diffuse fatty infiltration and no focal lesions.  Otherwise, the CAT scan showed right paracentral anterior abdominal hernia just above the umbilicus containing fat and small vessels but no bowel. There were some increased number of normal size mesenteric lymph nodes that had been noted on previous imaging and etiologies/significant was indeterminate. Her acute hepatitis panel was negative. She states that the milk thistle must be helping because her liver numbers have gone down over time, but I do not have previous labs for comparison.  She also reports that her PCP says that she is iron deficient. She reports that she had a endometrial ablation in December of 2012 and has not had her menses since that time. She was told after the ablation she would likely no longer need the iron pills that she was taking, however, her iron levels have remained low according to her report. Her hemoglobin from last week was actually normal at 14.6 g. MCV is normal as well. I do not have any iron studies to view. She denies any blood in her stool. Her stools are dark when she is taking iron supplements but otherwise no real bowel issues. She says that her  PCP had mentioned a colonoscopy to her previously.  She reports that for the last week or so she has been experiencing lower abdominal pains along with diffuse abdominal bloating and belching. She says that the symptoms used to be intermittent, however, for the past week it has been quite persistent. Symptoms do worsen when she eats, but they are also present even without eating. States the abdominal pains are below her belly buttonn and feels like a spasm. She was given Levsin by her PCP, which she has taken a couple of times but unsure if it has helped. She does use CPAP for several years but is wondering if that could be causing her belching issues. She takes omeprazole 20 mg daily for quite sometime and reports that she does not feel like she has heartburn or reflux while taking that medication. Denies NSAID use. Just of note, she had a previous abdominoplasty. She had gallbladder evaluation in December of 2013 with ultrasound which was normal except fatty liver infiltration. HIDA scan at that time was normal. She says that she has lost a few pounds recently because she has reduced her eating due to her GI symptoms. No nausea, vomiting, fevers, chills.   Past Medical History  Diagnosis Date  . Hypertension   . OSA on CPAP   . Vitamin D deficiency   . Iron deficiency   . Exercise-induced asthma   . Diabetes mellitus   . Hyperlipidemia   . Kidney stones   .  Hernia   . Obesity    Past Surgical History  Procedure Laterality Date  . Tonsilectomy, adenoidectomy, bilateral myringotomy and tubes    . Ablation      reports that she quit smoking about 27 years ago. She has never used smokeless tobacco. Her alcohol and drug histories are not on file. family history is not on file. No Known Allergies    Outpatient Encounter Prescriptions as of 07/10/2012  Medication Sig Dispense Refill  . albuterol (PROAIR HFA) 108 (90 BASE) MCG/ACT inhaler Inhale 2 puffs into the lungs every 6 (six) hours as  needed.        Marland Kitchen albuterol (PROVENTIL) (2.5 MG/3ML) 0.083% nebulizer solution Take 2.5 mg by nebulization every 6 (six) hours as needed.        . Cholecalciferol (VITAMIN D) 2000 UNITS CAPS Take 1 capsule by mouth daily.        . Dapagliflozin Propanediol (FARXIGA) 5 MG TABS Take 1 capsule by mouth daily.      . ferrous sulfate 325 (65 FE) MG tablet Take 325 mg by mouth 2 (two) times daily.        . hydrochlorothiazide (HYDRODIURIL) 12.5 MG tablet Take 12.5 mg by mouth daily.        Marland Kitchen lisinopril (PRINIVIL,ZESTRIL) 20 MG tablet Take 20 mg by mouth daily.        . Magnesium 500 MG CAPS Take 1 capsule by mouth daily. Taking 950mg  daily      . metFORMIN (GLUCOPHAGE) 500 MG tablet Take 1,500 mg by mouth daily.       . milk thistle 175 MG tablet Take 175 mg by mouth 2 (two) times daily.        . peg 3350 powder (MOVIPREP) 100 G SOLR Take 1 kit (100 g total) by mouth once.  1 kit  0  . [DISCONTINUED] fish oil-omega-3 fatty acids 1000 MG capsule Take by mouth daily.        . [DISCONTINUED] Flaxseed, Linseed, (FLAX SEED OIL) 1000 MG CAPS Take 1 capsule by mouth daily.        . [DISCONTINUED] loratadine (CLARITIN) 10 MG tablet Take 10 mg by mouth daily. As needed      . [DISCONTINUED] Magnesium 250 MG TABS Take 1 tablet by mouth daily.         No facility-administered encounter medications on file as of 07/10/2012.     REVIEW OF SYSTEMS  : All other systems reviewed and negative except where noted in the History of Present Illness.   PHYSICAL EXAM: BP 128/76  Pulse 92  Ht 5\' 4"  (1.626 m)  Wt 245 lb (111.131 kg)  BMI 42.03 kg/m2  SpO2 99% General: Well developed white female in no acute distress Head: Normocephalic and atraumatic Eyes:  sclerae anicteric,conjunctive pink. Ears: Normal auditory acuity Lungs: Clear throughout to auscultation Heart: Regular rate and rhythm Abdomen: Soft, obese, non-distended.  Normal bowel sounds.  Scars noted from previous abdominoplasty.  Lower abdominal TTP  without R/R/G. Musculoskeletal: Symmetrical with no gross deformities  Skin: No lesions on visible extremities Extremities: No edema  Neurological: Alert oriented x 4, grossly nonfocal Psychological:  Alert and cooperative. Normal mood and affect  ASSESSMENT AND PLAN: -Transaminitis:  Likely secondary to her fatty liver.  Viral hepatitis panel is negative.  Will check other serologies to rule out all other causes of chronic liver disease.  She was encouraged to diet, exercise, and lose weight. -Iron deficiency:  Her Hgb is normal, but she  reports that her PCP keeps telling her that her iron levels are low and the she needs to keep taking her iron supplements.  We will get the results of her most recent iron studies.  She has not menstruated in over a year.  Will schedule EGD and colonoscopy for evaluation. -Bloating/bleching:  ? If some component is secondary to her CPAP machine.  She will try simethicone or mylicon prn.  Continue omeprazole 20 mg daily. -Abdominal pain:  Possible secondary to scar tissue versus her abdominal hernia versus other etiologies.  Recent CT scan negative for other causes.  Continue the levsin prn that she was given by her PCP.

## 2012-07-10 NOTE — Progress Notes (Signed)
Reviewed and agree with management plan.  Zarrah Loveland T. Hiawatha Dressel, MD FACG 

## 2012-07-10 NOTE — Patient Instructions (Addendum)
GO TO THE BASEMENT FOR LABS TODAY  You have been scheduled for an endoscopy and colonoscopy with propofol. Please follow the written instructions given to you at your visit today. Please pick up your prep at the pharmacy within the next 1-3 days. If you use inhalers (even only as needed), please bring them with you on the day of your procedure. Your physician has requested that you go to www.[email or web address].com and enter the access code given to you at your visit today. This web site gives a general overview about your procedure. However, you should still follow specific instructions given to you by our office regarding your preparation for the procedure.

## 2012-07-11 ENCOUNTER — Encounter: Payer: Self-pay | Admitting: Gastroenterology

## 2012-07-11 ENCOUNTER — Other Ambulatory Visit: Payer: Self-pay | Admitting: Gastroenterology

## 2012-07-11 ENCOUNTER — Ambulatory Visit (AMBULATORY_SURGERY_CENTER): Payer: BC Managed Care – PPO | Admitting: Gastroenterology

## 2012-07-11 VITALS — BP 112/63 | HR 69 | Temp 98.7°F | Resp 18 | Ht 64.0 in | Wt 245.0 lb

## 2012-07-11 DIAGNOSIS — R109 Unspecified abdominal pain: Secondary | ICD-10-CM

## 2012-07-11 DIAGNOSIS — D509 Iron deficiency anemia, unspecified: Secondary | ICD-10-CM

## 2012-07-11 DIAGNOSIS — R140 Abdominal distension (gaseous): Secondary | ICD-10-CM

## 2012-07-11 DIAGNOSIS — D126 Benign neoplasm of colon, unspecified: Secondary | ICD-10-CM

## 2012-07-11 DIAGNOSIS — K5289 Other specified noninfective gastroenteritis and colitis: Secondary | ICD-10-CM

## 2012-07-11 DIAGNOSIS — K529 Noninfective gastroenteritis and colitis, unspecified: Secondary | ICD-10-CM

## 2012-07-11 DIAGNOSIS — K635 Polyp of colon: Secondary | ICD-10-CM

## 2012-07-11 DIAGNOSIS — D131 Benign neoplasm of stomach: Secondary | ICD-10-CM

## 2012-07-11 HISTORY — DX: Noninfective gastroenteritis and colitis, unspecified: K52.9

## 2012-07-11 HISTORY — PX: COLONOSCOPY W/ BIOPSIES: SHX1374

## 2012-07-11 HISTORY — PX: ESOPHAGOGASTRODUODENOSCOPY: SHX1529

## 2012-07-11 HISTORY — DX: Polyp of colon: K63.5

## 2012-07-11 LAB — ALPHA-1-ANTITRYPSIN: A-1 Antitrypsin, Ser: 127 mg/dL (ref 90–200)

## 2012-07-11 LAB — ANA: Anti Nuclear Antibody(ANA): NEGATIVE

## 2012-07-11 MED ORDER — GLYCOPYRROLATE 2 MG PO TABS: 2.0000 mg | ORAL_TABLET | Freq: Two times a day (BID) | ORAL | Status: DC

## 2012-07-11 MED ORDER — SODIUM CHLORIDE 0.9 % IV SOLN: 500.0000 mL | INTRAVENOUS | Status: DC

## 2012-07-11 NOTE — Patient Instructions (Addendum)

## 2012-07-11 NOTE — Progress Notes (Signed)
Patient did not experience any of the following events: a burn prior to discharge; a fall within the facility; wrong site/side/patient/procedure/implant event; or a hospital transfer or hospital admission upon discharge from the facility. (G8907) Patient did not have preoperative order for IV antibiotic SSI prophylaxis. (G8918)  

## 2012-07-11 NOTE — Progress Notes (Signed)
Stable to RR 

## 2012-07-11 NOTE — Op Note (Signed)
[facility] [address] [place], [address]   ENDOSCOPY PROCEDURE REPORT  PATIENT: Jenny Singleton, Jenny Singleton  MR#: [id number] BIRTHDATE: 08/07/1965 , 46  yrs. old GENDER: Female ENDOSCOPIST: Meryl Dare, MD, [facility] REFERRED BY:  Lucky Cowboy, M.D. PROCEDURE DATE:  07/11/2012 PROCEDURE:  EGD w/ biopsy ASA CLASS:     Class III INDICATIONS:  abdominal pain.   Iron deficiency anemia. MEDICATIONS: MAC sedation, administered by CRNA, residual sedation effect present from prior procedure, propofol (Diprivan) 100mg  IV, and Versed 1mg  IV TOPICAL ANESTHETIC: none DESCRIPTION OF PROCEDURE: After the risks benefits and alternatives of the procedure were thoroughly explained, informed consent was obtained.  The LB GIF-H180 [id number] endoscope was introduced through the mouth and advanced to the second portion of the duodenum without limitations.  The instrument was slowly withdrawn as the mucosa was fully examined.  STOMACH: A few sessile polyps ranging between 3-63mm in size were found in the gastric body and gastric fundus.  Multiple biopsies was performed.   The stomach otherwise appeared normal. ESOPHAGUS: The esophagus was otherwise normal.   The mucosa of the esophagus appeared normal. DUODENUM: The duodenal mucosa showed no abnormalities in the bulb and second portion of the duodenum.  Cold forceps biopsies were taken in the bulb and second portion.  Retroflexed views revealed no abnormalities.  The scope was then withdrawn from the patient and the procedure completed.  COMPLICATIONS: There were no complications.  ENDOSCOPIC IMPRESSION: 1.   Few sessile polyps in the gastric body and fundus 2.   The EGD otherwise appeared normal  RECOMMENDATIONS: 1.  Anti-reflux regimen 2.  Continue PPI 3.  Await pathology results    eSigned:  Meryl Dare, MD, [facility] 07/11/2012 2:40 PM

## 2012-07-11 NOTE — Op Note (Signed)
[facility] [address] [place], [address]   COLONOSCOPY PROCEDURE REPORT  PATIENT: Jenny, Singleton  MR#: [id number] BIRTHDATE: [birth year] , 46  yrs. old GENDER: Female ENDOSCOPIST: Meryl Dare, MD, [facility] REFERRED AO:[id number] Oneta Rack, M.D. PROCEDURE DATE:  07/11/2012 PROCEDURE:   Colonoscopy with biopsy and snare polypectomy ASA CLASS:   Class III INDICATIONS:Iron Deficiency Anemia and Abdominal pain. MEDICATIONS: MAC sedation, administered by CRNA, propofol (Diprivan) 300mg  IV, and Versed 3 mg IV DESCRIPTION OF PROCEDURE:   After the risks benefits and alternatives of the procedure were thoroughly explained, informed consent was obtained.  A digital rectal exam revealed no abnormalities of the rectum.   The LB CF-[id number] [id number]  endoscope was introduced through the anus and advanced to the terminal ileum which was intubated for a short distance. No adverse events experienced.   The quality of the prep was excellent, using MoviPrep  The instrument was then slowly withdrawn as the colon was fully examined.  COLON FINDINGS: A pedunculated polyp measuring 8 mm in size was found in the distal transverse colon.  A polypectomy was performed using snare cautery.  The resection was complete and the polyp tissue was completely retrieved.  Several, small, nonspecific erosions were found in the ascending colon and transverse colon. Multiple biopsies of the area were performed.   The colon was otherwise normal.  There was no diverticulosis, inflammation, polyps or cancers unless previously stated.   The mucosa appeared normal in the terminal ileum.  Retroflexed views revealed no abnormalities. The time to cecum=1 minutes 34 seconds.  Withdrawal time=12 minutes 32 seconds.  The scope was withdrawn and the procedure completed.  COMPLICATIONS: There were no complications.  ENDOSCOPIC IMPRESSION: 1.   Pedunculated polyp measuring 8 mm in the distal transverse colon;  polypectomy performed using snare cautery 2.   Erosions in the ascending and transverse colon; multiple biopsies performed 3.   Normal mucosa in the terminal ileum  RECOMMENDATIONS: 1.  Await pathology results 2.  Hold aspirin, aspirin products, and anti-inflammatory medication for 2 weeks. 3.  Repeat colonoscopy in 5 years if polyp adenomatous; otherwise 10 years 4.  Robinul forte 2 mg po bid, 1 year of refills  eSigned:  Meryl Dare, MD, [facility] 07/11/2012 2:36 PM

## 2012-07-12 ENCOUNTER — Telehealth: Payer: Self-pay | Admitting: *Deleted

## 2012-07-12 ENCOUNTER — Other Ambulatory Visit: Payer: Self-pay | Admitting: Gastroenterology

## 2012-07-12 NOTE — Telephone Encounter (Signed)
Number identifier, left message, follow-up  

## 2012-07-13 LAB — RETICULIN ANTIBODIES, IGA W TITER: Reticulin Ab, IgA: NEGATIVE

## 2012-07-18 ENCOUNTER — Encounter: Payer: Self-pay | Admitting: Gastroenterology

## 2012-08-06 ENCOUNTER — Other Ambulatory Visit: Payer: Self-pay | Admitting: Obstetrics and Gynecology

## 2012-10-12 ENCOUNTER — Ambulatory Visit (INDEPENDENT_AMBULATORY_CARE_PROVIDER_SITE_OTHER): Payer: BC Managed Care – PPO | Admitting: Emergency Medicine

## 2012-10-12 ENCOUNTER — Ambulatory Visit: Payer: BC Managed Care – PPO

## 2012-10-12 VITALS — BP 110/82 | HR 86 | Temp 98.0°F | Resp 16 | Ht 65.0 in | Wt 245.0 lb

## 2012-10-12 DIAGNOSIS — S99922A Unspecified injury of left foot, initial encounter: Secondary | ICD-10-CM

## 2012-10-12 DIAGNOSIS — S93609A Unspecified sprain of unspecified foot, initial encounter: Secondary | ICD-10-CM

## 2012-10-12 DIAGNOSIS — S8990XA Unspecified injury of unspecified lower leg, initial encounter: Secondary | ICD-10-CM

## 2012-10-12 DIAGNOSIS — S93602A Unspecified sprain of left foot, initial encounter: Secondary | ICD-10-CM

## 2012-10-12 DIAGNOSIS — S99929A Unspecified injury of unspecified foot, initial encounter: Secondary | ICD-10-CM

## 2012-10-12 MED ORDER — HYDROCODONE-ACETAMINOPHEN 5-325 MG PO TABS: 1.0000 | ORAL_TABLET | ORAL | Status: DC | PRN

## 2012-10-12 NOTE — Progress Notes (Signed)
[facility] [address], [place] [address] [phone]  Date:  10/12/2012   Name:  Jenny Singleton   DOB:  February 23, 1966   MRN:  [id number]  PCP:  Nadean Corwin, MD    Chief Complaint: Foot Swelling   History of Present Illness:  Tomorrow Jenny Singleton is a 47 y.o. very pleasant female patient who presents with the following:  Injured today while walking in heels.  Twisted her ankle and rolled her foot.  Has pain in lateral foot.  No ankle pain.  Pain with ambulation.  No improvement with over the counter medications or other home remedies. Denies other complaint or health concern today.   Patient Active Problem List   Diagnosis Date Noted  . Transaminitis 07/10/2012  . Fatty liver 07/10/2012  . Bloating 07/10/2012  . Abdominal  pain, other specified site 07/10/2012  . Iron deficiency anemia, unspecified 07/10/2012  . Obesity 09/02/2010  . OBSTRUCTIVE SLEEP APNEA 06/12/2007  . Exercise induced bronchospasm 06/12/2007  . HYPERTENSION 12/01/2006    Past Medical History  Diagnosis Date  . Hypertension   . OSA on CPAP   . Vitamin D deficiency   . Iron deficiency   . Exercise-induced asthma   . Diabetes mellitus   . Hyperlipidemia   . Kidney stones   . Hernia   . Obesity     Past Surgical History  Procedure Laterality Date  . Tonsilectomy, adenoidectomy, bilateral myringotomy and tubes    . Ablation    . Abdominoplasty      History  Substance Use Topics  . Smoking status: Former Smoker -- 1.00 packs/day for 2 years    Quit date: 03/21/1985  . Smokeless tobacco: Never Used     Comment: smoked age [age]  . Alcohol Use: Not on file    History reviewed. No pertinent family history.  No Known Allergies  Medication list has been reviewed and updated.  Current Outpatient Prescriptions on File Prior to Visit  Medication Sig Dispense Refill  . Cholecalciferol (VITAMIN D) 2000 UNITS CAPS Take 1 capsule by mouth daily.        Marland Kitchen lisinopril  (PRINIVIL,ZESTRIL) 20 MG tablet Take 40 mg by mouth daily.       . Magnesium 500 MG CAPS Take 1 capsule by mouth daily. Taking 950mg  daily      . metFORMIN (GLUCOPHAGE) 500 MG tablet Take 1,500 mg by mouth daily.       . milk thistle 175 MG tablet Take 175 mg by mouth 2 (two) times daily.        Marland Kitchen omeprazole (PRILOSEC) 20 MG capsule       . pravastatin (PRAVACHOL) 40 MG tablet       . albuterol (PROAIR HFA) 108 (90 BASE) MCG/ACT inhaler Inhale 2 puffs into the lungs every 6 (six) hours as needed.        Marland Kitchen albuterol (PROVENTIL) (2.5 MG/3ML) 0.083% nebulizer solution Take 2.5 mg by nebulization every 6 (six) hours as needed.        . Dapagliflozin Propanediol (FARXIGA) 5 MG TABS Take 1 capsule by mouth daily.      . ferrous sulfate 325 (65 FE) MG tablet Take 325 mg by mouth 2 (two) times daily.        Marland Kitchen glycopyrrolate (ROBINUL) 2 MG tablet Take 1 tablet (2 mg total) by mouth 2 (two) times daily.  180 tablet  11  . hyoscyamine (LEVSIN, ANASPAZ) 0.125 MG tablet       .  PATADAY 0.2 % SOLN        No current facility-administered medications on file prior to visit.    Review of Systems:  As per HPI, otherwise negative.    Physical Examination: Filed Vitals:   10/12/12 1820  BP: 110/82  Pulse: 86  Temp: 98 F (36.7 C)  Resp: 16   Filed Vitals:   10/12/12 1820  Height: 5\' 5"  (1.651 m)  Weight: 245 lb (111.131 kg)   Body mass index is 40.77 kg/(m^2). Ideal Body Weight: Weight in (lb) to have BMI = 25: 149.9   GEN: WDWN, NAD, Non-toxic, Alert & Oriented x 3 HEENT: Atraumatic, Normocephalic.  Ears and Nose: No external deformity. EXTR: No clubbing/cyanosis/edema NEURO: Normal gait.  PSYCH: Normally interactive. Conversant. Not depressed or anxious appearing.  Calm demeanor.  LEFT foot;  Tender lateral foot. No deformity or ecchymosis.    Assessment and Plan: Sprain foot Crutches wbat Cast shoe Hydrocodone   Signed,  Phillips Odor, MD   [facility] reading (PRIMARY) by  Dr.  Dareen Piano.  Negative foot .

## 2012-10-12 NOTE — Patient Instructions (Addendum)
Foot Sprain °The muscles and cord like structures which attach muscle to bone (tendons) that surround the feet are made up of units. A foot sprain can occur at the weakest spot in any of these units. This condition is most often caused by injury to or overuse of the foot, as from playing contact sports, or aggravating a previous injury, or from poor conditioning, or obesity. °SYMPTOMS °· Pain with movement of the foot. °· Tenderness and swelling at the injury site. °· Loss of strength is present in moderate or severe sprains. °THE THREE GRADES OR SEVERITY OF FOOT SPRAIN ARE: °· Mild (Grade I): Slightly pulled muscle without tearing of muscle or tendon fibers or loss of strength. °· Moderate (Grade II): Tearing of fibers in a muscle, tendon, or at the attachment to bone, with small decrease in strength. °· Severe (Grade III): Rupture of the muscle-tendon-bone attachment, with separation of fibers. Severe sprain requires surgical repair. Often repeating (chronic) sprains are caused by overuse. Sudden (acute) sprains are caused by direct injury or over-use. °DIAGNOSIS  °Diagnosis of this condition is usually by your own observation. If problems continue, a caregiver may be required for further evaluation and treatment. X-rays may be required to make sure there are not breaks in the bones (fractures) present. Continued problems may require physical therapy for treatment. °PREVENTION °· Use strength and conditioning exercises appropriate for your sport. °· Warm up properly prior to working out. °· Use athletic shoes that are made for the sport you are participating in. °· Allow adequate time for healing. Early return to activities makes repeat injury more likely, and can lead to an unstable arthritic foot that can result in prolonged disability. Mild sprains generally heal in 3 to 10 days, with moderate and severe sprains taking 2 to 10 weeks. Your caregiver can help you determine the proper time required for  healing. °HOME CARE INSTRUCTIONS  °· Apply ice to the injury for 15-20 minutes, 3-4 times per day. Put the ice in a plastic bag and place a towel between the bag of ice and your skin. °· An elastic wrap (like an Ace bandage) may be used to keep swelling down. °· Keep foot above the level of the heart, or at least raised on a footstool, when swelling and pain are present. °· Try to avoid use other than gentle range of motion while the foot is painful. Do not resume use until instructed by your caregiver. Then begin use gradually, not increasing use to the point of pain. If pain does develop, decrease use and continue the above measures, gradually increasing activities that do not cause discomfort, until you gradually achieve normal use. °· Use crutches if and as instructed, and for the length of time instructed. °· Keep injured foot and ankle wrapped between treatments. °· Massage foot and ankle for comfort and to keep swelling down. Massage from the toes up towards the knee. °· Only take over-the-counter or prescription medicines for pain, discomfort, or fever as directed by your caregiver. °SEEK IMMEDIATE MEDICAL CARE IF:  °· Your pain and swelling increase, or pain is not controlled with medications. °· You have loss of feeling in your foot or your foot turns cold or blue. °· You develop new, unexplained symptoms, or an increase of the symptoms that brought you to your caregiver. °MAKE SURE YOU:  °· Understand these instructions. °· Will watch your condition. °· Will get help right away if you are not doing well or get worse. °Document Released:   08/27/2001 Document Revised: 05/30/2011 Document Reviewed: 10/25/2007 °ExitCare® Patient Information ©2014 [employer]. ° °

## 2012-11-23 ENCOUNTER — Ambulatory Visit (HOSPITAL_COMMUNITY)
Admission: RE | Admit: 2012-11-23 | Discharge: 2012-11-23 | Disposition: A | Discharge status: Home / Self Care | Payer: BC Managed Care – PPO | Source: Ambulatory Visit | Attending: Internal Medicine | Admitting: Internal Medicine

## 2012-11-23 ENCOUNTER — Other Ambulatory Visit (HOSPITAL_COMMUNITY): Payer: Self-pay | Admitting: Internal Medicine

## 2012-11-23 DIAGNOSIS — M25519 Pain in unspecified shoulder: Secondary | ICD-10-CM | POA: Insufficient documentation

## 2012-11-23 DIAGNOSIS — M25511 Pain in right shoulder: Secondary | ICD-10-CM

## 2013-02-13 ENCOUNTER — Other Ambulatory Visit: Payer: Self-pay | Admitting: Internal Medicine

## 2013-02-13 ENCOUNTER — Encounter: Payer: Self-pay | Admitting: Internal Medicine

## 2013-02-13 MED ORDER — DAPAGLIFLOZIN PROPANEDIOL 5 MG PO TABS: 1.0000 | ORAL_TABLET | Freq: Every day | ORAL | Status: DC

## 2013-02-13 MED ORDER — PRAVASTATIN SODIUM 40 MG PO TABS: 40.0000 mg | ORAL_TABLET | Freq: Every day | ORAL | Status: DC

## 2013-02-13 MED ORDER — METFORMIN HCL 500 MG PO TABS: 1000.0000 mg | ORAL_TABLET | Freq: Two times a day (BID) | ORAL | Status: DC

## 2013-02-13 NOTE — Progress Notes (Signed)
This encounter was created in error - please disregard.

## 2013-03-01 ENCOUNTER — Encounter: Payer: Self-pay | Admitting: Emergency Medicine

## 2013-03-01 ENCOUNTER — Ambulatory Visit (INDEPENDENT_AMBULATORY_CARE_PROVIDER_SITE_OTHER): Payer: BC Managed Care – PPO | Admitting: Emergency Medicine

## 2013-03-01 VITALS — BP 124/82 | HR 68 | Temp 98.2°F | Resp 18 | Wt 240.0 lb

## 2013-03-01 DIAGNOSIS — R220 Localized swelling, mass and lump, head: Secondary | ICD-10-CM

## 2013-03-01 DIAGNOSIS — K148 Other diseases of tongue: Secondary | ICD-10-CM

## 2013-03-01 DIAGNOSIS — J309 Allergic rhinitis, unspecified: Secondary | ICD-10-CM

## 2013-03-01 MED ORDER — PREDNISONE 10 MG PO TABS: ORAL_TABLET | ORAL | Status: DC

## 2013-03-01 MED ORDER — AMOXICILLIN 500 MG PO CAPS: 500.0000 mg | ORAL_CAPSULE | Freq: Three times a day (TID) | ORAL | Status: DC

## 2013-03-01 NOTE — Patient Instructions (Signed)
Allergic Rhinitis Allergic rhinitis is when the mucous membranes in the nose respond to allergens. Allergens are particles in the air that cause your body to have an allergic reaction. This causes you to release allergic antibodies. Through a chain of events, these eventually cause you to release histamine into the blood stream (hence the use of antihistamines). Although meant to be protective to the body, it is this release that causes your discomfort, such as frequent sneezing, congestion and an itchy runny nose.  CAUSES  The pollen allergens may come from grasses, trees, and weeds. This is seasonal allergic rhinitis, or "hay fever." Other allergens cause year-round allergic rhinitis (perennial allergic rhinitis) such as house dust mite allergen, pet dander and mold spores.  SYMPTOMS   Nasal stuffiness (congestion).  Runny, itchy nose with sneezing and tearing of the eyes.  There is often an itching of the mouth, eyes and ears. It cannot be cured, but it can be controlled with medications. DIAGNOSIS  If you are unable to determine the offending allergen, skin or blood testing may find it. TREATMENT   Avoid the allergen.  Medications and allergy shots (immunotherapy) can help.  Hay fever may often be treated with antihistamines in pill or nasal spray forms. Antihistamines block the effects of histamine. There are over-the-counter medicines that may help with nasal congestion and swelling around the eyes. Check with your caregiver before taking or giving this medicine. If the treatment above does not work, there are many new medications your caregiver can prescribe. Stronger medications may be used if initial measures are ineffective. Desensitizing injections can be used if medications and avoidance fails. Desensitization is when a patient is given ongoing shots until the body becomes less sensitive to the allergen. Make sure you follow up with your caregiver if problems continue. SEEK MEDICAL  CARE IF:   You develop fever (more than 100.5 F (38.1 C).  You develop a cough that does not stop easily (persistent).  You have shortness of breath.  You start wheezing.  Symptoms interfere with normal daily activities. Document Released: 11/30/2000 Document Revised: 05/30/2011 Document Reviewed: 06/11/2008 ExitCare Patient Information 2014 [employer].  

## 2013-03-03 NOTE — Progress Notes (Signed)
Subjective:    Patient ID: Jenny Singleton, female    DOB: April 04, 1965, 47 y.o.   MRN: [id number]  HPI Comments: 47 yo female presents with blister on tongue x 2 days. She notes very painful to swallow due to blister. She has had increased allergy drainage, sinus pressure, ear pressure and mild fatigue x 1 day. She has not tried any OTC  Sore Throat  Associated symptoms include ear pain and headaches.  Headache  Associated symptoms include ear pain and sinus pressure.  Otalgia  Associated symptoms include headaches.     Current Outpatient Prescriptions on File Prior to Visit  Medication Sig Dispense Refill  . lisinopril (PRINIVIL,ZESTRIL) 20 MG tablet Take 40 mg by mouth daily.       . Magnesium 500 MG CAPS Take 1 capsule by mouth daily. Taking 950mg  daily      . milk thistle 175 MG tablet Take 175 mg by mouth 2 (two) times daily.        Marland Kitchen omeprazole (PRILOSEC) 20 MG capsule       . pravastatin (PRAVACHOL) 40 MG tablet Take 1 tablet (40 mg total) by mouth daily.  30 tablet  3  . albuterol (PROAIR HFA) 108 (90 BASE) MCG/ACT inhaler Inhale 2 puffs into the lungs every 6 (six) hours as needed.        Marland Kitchen albuterol (PROVENTIL) (2.5 MG/3ML) 0.083% nebulizer solution Take 2.5 mg by nebulization every 6 (six) hours as needed.        . ferrous sulfate 325 (65 FE) MG tablet Take 325 mg by mouth 2 (two) times daily.        Marland Kitchen glycopyrrolate (ROBINUL) 2 MG tablet Take 1 tablet (2 mg total) by mouth 2 (two) times daily.  180 tablet  11  . HYDROcodone-acetaminophen (NORCO) 5-325 MG per tablet Take 1-2 tablets by mouth every 4 (four) hours as needed for pain.  30 tablet  0  . hyoscyamine (LEVSIN, ANASPAZ) 0.125 MG tablet       . PATADAY 0.2 % SOLN        No current facility-administered medications on file prior to visit.   ALLERGIES Welchol  Past Medical History  Diagnosis Date  . Hypertension   . OSA on CPAP   . Vitamin D deficiency   . Iron deficiency   . Exercise-induced asthma   .  Diabetes mellitus   . Hyperlipidemia   . Kidney stones   . Hernia   . Obesity     Review of Systems  Constitutional: Positive for fatigue.  HENT: Positive for ear pain, mouth sores and sinus pressure.   Neurological: Positive for headaches.  All other systems reviewed and are negative.    BP 124/82  Pulse 68  Temp(Src) 98.2 F (36.8 C) (Temporal)  Resp 18  Wt 240 lb (108.863 kg)     Objective:   Physical Exam  Nursing note and vitals reviewed. Constitutional: She is oriented to person, place, and time. She appears well-developed and well-nourished.  HENT:  Head: Normocephalic and atraumatic.  Right Ear: External ear normal.  Left Ear: External ear normal.  Nose: Nose normal.  Mouth/Throat: Oropharynx is clear and moist. No oropharyngeal exudate.    TMs cloudy  Eyes: Conjunctivae and EOM are normal.  Neck: Normal range of motion.  Cardiovascular: Normal rate, regular rhythm, normal heart sounds and intact distal pulses.   Pulmonary/Chest: Effort normal and breath sounds normal.  Musculoskeletal: Normal range of motion.  Lymphadenopathy:  She has no cervical adenopathy.  Neurological: She is alert and oriented to person, place, and time.  Skin: Skin is warm and dry.  Psychiatric: She has a normal mood and affect. Judgment normal.          Assessment & Plan:  1. Tongue mass- ? Infection vs abnormal growth, if no change ENT evaluation, she w/c with results. Amox 500 TID, Pred 10 mg AD. Warm salt H2o gargles 2. Allergic rhinitis- Allegra QD

## 2013-03-22 ENCOUNTER — Other Ambulatory Visit: Payer: Self-pay | Admitting: Physician Assistant

## 2013-03-22 MED ORDER — LISINOPRIL-HYDROCHLOROTHIAZIDE 20-12.5 MG PO TABS: 1.0000 | ORAL_TABLET | Freq: Every day | ORAL | Status: DC

## 2013-03-26 ENCOUNTER — Other Ambulatory Visit: Payer: Self-pay | Admitting: Emergency Medicine

## 2013-03-26 MED ORDER — LISINOPRIL 20 MG PO TABS: 40.0000 mg | ORAL_TABLET | Freq: Every day | ORAL | Status: DC

## 2013-04-18 ENCOUNTER — Other Ambulatory Visit: Payer: Self-pay | Admitting: *Deleted

## 2013-04-18 MED ORDER — OMEPRAZOLE 20 MG PO CPDR: 20.0000 mg | DELAYED_RELEASE_CAPSULE | Freq: Every day | ORAL | Status: DC

## 2013-04-22 ENCOUNTER — Encounter: Payer: Self-pay | Admitting: *Deleted

## 2013-04-23 ENCOUNTER — Other Ambulatory Visit: Payer: Self-pay | Admitting: *Deleted

## 2013-04-23 MED ORDER — OMEPRAZOLE 20 MG PO CPDR: 20.0000 mg | DELAYED_RELEASE_CAPSULE | Freq: Every day | ORAL | Status: DC

## 2013-04-24 ENCOUNTER — Encounter: Payer: Self-pay | Admitting: Emergency Medicine

## 2013-05-23 ENCOUNTER — Encounter: Payer: Self-pay | Admitting: Emergency Medicine

## 2013-05-23 ENCOUNTER — Other Ambulatory Visit: Payer: Self-pay | Admitting: Physician Assistant

## 2013-05-23 ENCOUNTER — Other Ambulatory Visit: Payer: Self-pay | Admitting: Emergency Medicine

## 2013-05-23 ENCOUNTER — Ambulatory Visit (INDEPENDENT_AMBULATORY_CARE_PROVIDER_SITE_OTHER): Payer: BC Managed Care – PPO | Admitting: Emergency Medicine

## 2013-05-23 VITALS — BP 138/90 | HR 70 | Temp 98.0°F | Resp 18 | Ht 64.25 in | Wt 240.0 lb

## 2013-05-23 DIAGNOSIS — Z0000 Encounter for general adult medical examination without abnormal findings: Secondary | ICD-10-CM

## 2013-05-23 DIAGNOSIS — I10 Essential (primary) hypertension: Secondary | ICD-10-CM

## 2013-05-23 DIAGNOSIS — Z111 Encounter for screening for respiratory tuberculosis: Secondary | ICD-10-CM

## 2013-05-23 DIAGNOSIS — E782 Mixed hyperlipidemia: Secondary | ICD-10-CM

## 2013-05-23 DIAGNOSIS — E669 Obesity, unspecified: Secondary | ICD-10-CM

## 2013-05-23 DIAGNOSIS — Z79899 Other long term (current) drug therapy: Secondary | ICD-10-CM

## 2013-05-23 DIAGNOSIS — E559 Vitamin D deficiency, unspecified: Secondary | ICD-10-CM

## 2013-05-23 DIAGNOSIS — E119 Type 2 diabetes mellitus without complications: Secondary | ICD-10-CM

## 2013-05-23 LAB — CBC WITH DIFFERENTIAL/PLATELET
BASOS ABS: 0 10*3/uL (ref 0.0–0.1)
BASOS PCT: 0 % (ref 0–1)
Eosinophils Absolute: 0.1 10*3/uL (ref 0.0–0.7)
Eosinophils Relative: 2 % (ref 0–5)
HEMATOCRIT: 42.5 % (ref 36.0–46.0)
Hemoglobin: 14.4 g/dL (ref 12.0–15.0)
LYMPHS PCT: 44 % (ref 12–46)
Lymphs Abs: 2.7 10*3/uL (ref 0.7–4.0)
MCH: 28.1 pg (ref 26.0–34.0)
MCHC: 33.9 g/dL (ref 30.0–36.0)
MCV: 82.8 fL (ref 78.0–100.0)
MONO ABS: 0.3 10*3/uL (ref 0.1–1.0)
Monocytes Relative: 5 % (ref 3–12)
NEUTROS ABS: 3 10*3/uL (ref 1.7–7.7)
Neutrophils Relative %: 49 % (ref 43–77)
Platelets: 324 10*3/uL (ref 150–400)
RBC: 5.13 MIL/uL — ABNORMAL HIGH (ref 3.87–5.11)
RDW: 14.5 % (ref 11.5–15.5)
WBC: 6.2 10*3/uL (ref 4.0–10.5)

## 2013-05-23 LAB — HEMOGLOBIN A1C
Hgb A1c MFr Bld: 7.2 % — ABNORMAL HIGH (ref <5.7)
MEAN PLASMA GLUCOSE: 160 mg/dL — AB (ref <117)

## 2013-05-23 NOTE — Progress Notes (Signed)
Subjective:    Patient ID: Jenny Singleton, female    DOB: 1965/08/07, 48 y.o.   MRN: [id number]  HPI Comments: 48 yo obese female for CPE and presents for 3 month F/U for HTN, Cholesterol, DM, D. Deficient. BP has been good at home. She has been eating better except for cruise. She is keeping active. She notes she recently returned from cruise which decreased exercise routine. BS has been 120s. She checks routinely. She checks feet routinely and denies skin break down or neuropathy increase. She uses CPAP qhs and notes mild irritation with skin around nose. She has mild fatigue since cruise but feels it's because she's out of normal routine. She has Hx of anemia but notes labs have been stable. LAST ABN LABS BS 121 T 171 TG 265 L 75  INSULN 182 AC 7.6 ALT 40 MAG 2 D 52 IRON 14 HEP NEG MALBU NEG B12 381   She is concerned about crohn's with recurrent gas/ bloating/ cramping and occasionally green and loose stools. She has had Ct ABD/ [place], [place] Hida Scan all WNL. She has tried to change diet w/o any relief. She had colonoscopy/ EGD 06/2012 at DR [facility] Plan both WNL/ benign polyps. She has a Hx of fatty liver and mildly elevated LFTs which have been stable.    Diabetes Associated symptoms include fatigue.  Hypertension  Hyperlipidemia   Current Outpatient Prescriptions on File Prior to Visit  Medication Sig Dispense Refill  . Cholecalciferol (VITAMIN D-3) 5000 UNITS TABS Take by mouth daily.      . Dapagliflozin Propanediol (FARXIGA) 10 MG TABS Take by mouth daily.      Marland Kitchen lisinopril (PRINIVIL,ZESTRIL) 20 MG tablet Take 2 tablets (40 mg total) by mouth daily.  60 tablet  3  . Magnesium 500 MG CAPS Take 1 capsule by mouth daily. Taking 950mg  daily      . milk thistle 175 MG tablet Take 175 mg by mouth 2 (two) times daily.        Marland Kitchen omeprazole (PRILOSEC) 20 MG capsule Take 1 capsule (20 mg total) by mouth daily.  30 capsule  1  . pravastatin (PRAVACHOL) 40 MG tablet Take 1 tablet (40 mg total) by  mouth daily.  30 tablet  3   No current facility-administered medications on file prior to visit.   Allergies  Allergen Reactions  . Welchol [Colesevelam Hcl]     HIves   Past Medical History  Diagnosis Date  . Hypertension   . OSA on CPAP   . Vitamin D deficiency   . Iron deficiency   . Exercise-induced asthma   . Diabetes mellitus   . Hyperlipidemia   . Kidney stones   . Hernia   . Obesity    Past Surgical History  Procedure Laterality Date  . Tonsilectomy, adenoidectomy, bilateral myringotomy and tubes    . Ablation    . Abdominoplasty     History  Substance Use Topics  . Smoking status: Former Smoker -- 1.00 packs/day for 2 years    Quit date: 03/21/1985  . Smokeless tobacco: Never Used     Comment: smoked age [age]  . Alcohol Use: Not on file   No family history on file. Patient ADOPTED  Patient Care Team: Unk Pinto, MD as PCP - General (Internal Medicine) Ladene Artist, MD as Consulting Physician (Gastroenterology) Remer Macho as Attending Physician (Ophthalmology) Lenore Cordia (Obstetrics and Gynecology) Kerin Salen, MD as Consulting Physician (Orthopedic Surgery) Molli Hazard, MD  as Consulting Physician (Urology) Sherlyn Lick (Dermatology)  Maintenance: Colonoscopy, 07/11/12 EGD, 07/11/12 EYE, 2014 WNL Mammo,  @ GYN WNL 2014 PAP, 05/19/12 WNL Influenza 01/01/13     Review of Systems  Constitutional: Positive for fatigue.  Gastrointestinal: Positive for abdominal pain, diarrhea and abdominal distention.  Skin: Positive for color change.  All other systems reviewed and are negative.   BP 138/90  Pulse 70  Temp(Src) 98 F (36.7 C) (Temporal)  Resp 18  Ht 5' 4.25" (1.632 m)  Wt 240 lb (108.863 kg)  BMI 40.87 kg/m2     Objective:   Physical Exam  Nursing note and vitals reviewed. Constitutional: She is oriented to person, place, and time. She appears well-developed and well-nourished. No distress.  Obese  centrally  HENT:  Head: Normocephalic and atraumatic.  Right Ear: External ear normal.  Left Ear: External ear normal.  Nose: Nose normal.  Mouth/Throat: Oropharynx is clear and moist. No oropharyngeal exudate.  Large tongue obstructs airway, large neck  Eyes: Conjunctivae and EOM are normal. Pupils are equal, round, and reactive to light. Right eye exhibits no discharge. Left eye exhibits no discharge. No scleral icterus.  Neck: Normal range of motion. Neck supple. No JVD present. No tracheal deviation present. No thyromegaly present.  Cardiovascular: Normal rate, regular rhythm, normal heart sounds and intact distal pulses.   Pulmonary/Chest: Effort normal and breath sounds normal.  Abdominal: Soft. Bowel sounds are normal. She exhibits no distension and no mass. There is no tenderness. There is no rebound and no guarding.  Genitourinary:  Def gyn  Musculoskeletal: Normal range of motion. She exhibits no edema and no tenderness.  Lymphadenopathy:    She has no cervical adenopathy.  Neurological: She is alert and oriented to person, place, and time. She has normal reflexes. No cranial nerve deficit. She exhibits normal muscle tone. Coordination normal.  Skin: Skin is warm and dry. No rash noted. No erythema. No pallor.  At nose from CPAP irritation with mild erythema  Psychiatric: She has a normal mood and affect. Her behavior is normal. Judgment and thought content normal.   EKG NSCSPT    Assessment & Plan:  1. CPE- Update screening labs/ History/ Immunizations/ Testing as needed. Advised healthy diet, QD exercise, increase H20 and continue RX/ Vitamins AD.  2. 3 month F/U for OSA with CPAP, Obesity, HTN, Cholesterol,DM, D. Deficient. Needs healthy diet, cardio QD and obtain healthy weight. Check Labs, Check BP if >130/80 call office, Check BS if >200 call office  3. Diarrhea/ Abdomen/ cramping/ Abnormal stools- Try bland diet, probiotic QD, Metamucil TID if no change REF GI

## 2013-05-23 NOTE — Patient Instructions (Signed)
Crohn's Disease Crohn's disease is a long-term (chronic) soreness and redness (inflammation) of the intestines (bowel). It can affect any portion of the digestive tract, from the mouth to the anus. It can also cause problems outside the digestive tract. Crohn's disease is closely related to a disease called ulcerative colitis (together, these two diseases are called inflammatory bowel disease).  CAUSES  The cause of Crohn's disease is not known. One theory is that, in an easily affected person, the immune system is triggered to attack the body's own digestive tissue. Crohn's disease runs in families. It seems to be more common in certain geographic areas and amongst certain races. There are no clear-cut dietary causes.  SYMPTOMS  Crohn's disease can cause many different symptoms since it can affect many different parts of the body. Symptoms include:  Fatigue.  Weight loss.  Chronic diarrhea, sometime bloody.  Abdominal pain and cramps.  Fever.  Ulcers or canker sores in the mouth or rectum.  Anemia (low red blood cells).  Arthritis, skin problems, and eye problems may occur. Complications of Crohn's disease can include:  Series of holes (perforation) of the bowel.  Portions of the intestines sticking to each other (adhesions).  Obstruction of the bowel.  Fistula formation, typically in the rectal area but also in other areas. A fistula is an opening between the bowels and the outside, or between the bowels and another organ.  A painful crack in the mucous membrane of the anus (rectal fissure). DIAGNOSIS  Your caregiver may suspect Crohn's disease based on your symptoms and an exam. Blood tests may confirm that there is a problem. You may be asked to submit a stool specimen for examination. X-rays and CT scans may be necessary. Ultimately, the diagnosis is usually made after a procedure that uses a flexible tube that is inserted via your mouth or your anus. This is done under  sedation and is called either an upper endoscopy or colonoscopy. With these tests, the specialist can take tiny tissue samples and remove them from the inside of the bowel (biopsy). Examination of this biopsy tissue under a microscope can reveal Crohn's disease as the cause of your symptoms. Due to the many different forms that Crohn's disease can take, symptoms may be present for several years before a diagnosis is made. TREATMENT  Medications are often used to decrease inflammation and control the immune system. These include medicines related to aspirin, steroid medications, and newer and stronger medications to slow down the immune system. Some medications may be used as suppositories or enemas. A number of other medications are used or have been studied. Your caregiver will make specific recommendations. HOME CARE INSTRUCTIONS   Symptoms such as diarrhea can be controlled with medications. Avoid foods that have a laxative effect such as fresh fruit, vegetables and dairy products. During flare ups, you can rest your bowel by refraining from solid foods. Drink clear liquids frequently during the day (electrolyte or re-hydrating fluids are best. Your caregiver can help you with suggestions). Drink often to prevent loss of body fluids (dehydration). When diarrhea has cleared, eat small meals and more frequently. Avoid food additives and stimulants such as caffeine (coffee, tea, or chocolate). Enzyme supplements may help if you develop intolerance to a sugar in dairy products (lactose). Ask your caregiver or dietitian about specific dietary instructions.  Try to maintain a positive attitude. Learn relaxation techniques such as self hypnosis, mental imaging, and muscle relaxation.  If possible, avoid stresses which can aggravate your condition.    Exercise regularly.  Follow your diet.  Always get plenty of rest. SEEK MEDICAL CARE IF:   Your symptoms fail to improve after a week or two of new  treatment.  You experience continued weight loss.  You have ongoing cramps or loose bowels.  You develop a new skin rash, skin sores, or eye problems. SEEK IMMEDIATE MEDICAL CARE IF:   You have worsening of your symptoms or develop new symptoms.  You have a fever.  You develop bloody diarrhea.  You develop severe abdominal pain. MAKE SURE YOU:   Understand these instructions.  Will watch your condition.  Will get help right away if you are not doing well or get worse. Document Released: 12/15/2004 Document Revised: 07/02/2012 Document Reviewed: 11/13/2006 [facility] Patient Information 2014 [place], [place]. Ulcerative Colitis Ulcerative colitis is a long lasting swelling and soreness (inflammation) of the colon (large intestine). In patients with ulcerative colitis, sores (ulcers) and inflammation of the inner lining of the colon lead to illness. Ulcerative colitis can also cause problems outside the digestive tract.  Ulcerative colitis is closely related to another condition of inflammation of the intestines called Crohn's disease. Together, they are frequently referred to as inflammatory bowel disease (IBD). Ulcerative colitis and Crohn's diseases are conditions that can last years to decades. Men and women are affected equally. They most commonly begin during adolescence and early adulthood. SYMPTOMS  Common symptoms of ulcerative colitis include rectal bleeding and diarrhea. There is a wide range of symptoms among patients with this disease depending on how severe the disease is. Some of these symptoms are:  Abdominal pain or cramping.  Diarrhea.  Fever.  Tiredness (fatigue).  Weight loss.  Night sweats.  Rectal pain.  Feeling the immediate need to have a bowel movement (rectal urgency). CAUSES  Ulcerative colitis is caused by increased activity of the immune system in the intestines. The immune system is the system that protects the body against disease such as  harmful bacteria, viruses, fungi, and other foreign invaders. When the immune system overacts, it causes inflammation. The cause of the increased immune system activity is not known. This over activity causes long-lasting inflammation and ulceration. This condition may be passed down from your parents (inherited). Brothers, sisters, children, and parents of patients with IBD are more likely to develop these diseases. It is not contagious. This means you cannot catch it from someone else. DIAGNOSIS  Your caregiver may suspect ulcerative colitis based on your symptoms and exam. Blood tests may confirm that there is a problem. You may be asked to submit a stool specimen for examination. X-rays and CT scans may be necessary. Ultimately, the diagnosis is usually made after a flexible tube is inserted via your anus and your colon is examined under sedation (colonoscopy). With this test, the specialist can take a tiny tissue sample from inside the bowel (biopsy). Examination of this biopsy tissue under a microscopy can reveal ulcerative colitis as the cause of your symptoms. TREATMENT   There is no cure for ulcerative colitis.  Complications such as massive bleeding from the colon (hemorrhage), development of a hole in the colon (perforation), or the development of precancerous or cancerous changes of the colon may require surgery.  Medications are often used to decrease inflammation and control the immune system. These include medicines related to aspirin, steroid medications, and newer and stronger medications to slow down the immune system. Some medications may be used as suppositories or enemas. A number of other medications are used or have  been studied. Your caregiver will make specific recommendations. HOME CARE INSTRUCTIONS   There is no cure for ulcerative colitis disease. The best treatment is frequent checkups with your caregiver. Periodic reevaluation is important.  Symptoms such as diarrhea can  be controlled with medications. Avoid foods that have a laxative effect such fresh fruit and vegetables and dairy products. During flare ups, you can rest your bowel by staying away from solid foods. Drink clear liquids frequently during the day. Electrolyte or rehydrating fluids are best. Your caregiver can help you with suggestions. Drink often to prevent dehydration. When diarrhea has cleared, eat smaller meals and more often. Avoid food additives and stimulants such as caffeine (coffee, tea, many sodas, or chocolate). Avoid dairy products. Enzyme supplements may help if you develop intolerance to a sugar in dairy products (lactose). Ask your caregiver or dietitian about specific dietary instructions.  If you had surgery, be sure you understand your care instructions thoroughly, including proper care of any surgical wounds.  Take any medications exactly as prescribed.  Try to maintain a positive attitude. Learn relaxation techniques such as self hypnosis, mental imaging, and muscle relaxation. If possible, avoid stresses that aggravate your condition. Exercise regularly. Follow your diet. Always get plenty of rest. SEEK MEDICAL CARE IF:   Your symptoms fail to improve after a week or two of new treatment.  You experience continued weight loss.  You have ongoing crampy digestion or loose bowels.  You develop a new skin rash, skin sores, or eye problems. SEEK IMMEDIATE MEDICAL CARE IF:   You have worsening of your symptoms or develop new symptoms.  You have an oral temperature above 102 F (38.9 C), not controlled by medicine.  You develop bloody diarrhea.  You have severe abdominal pain. Document Released: 12/15/2004 Document Revised: 05/30/2011 Document Reviewed: 11/14/2006 [facility] Patient Information 2014 [place], [place].

## 2013-05-24 LAB — URINALYSIS, ROUTINE W REFLEX MICROSCOPIC
Bilirubin Urine: NEGATIVE
Hgb urine dipstick: NEGATIVE
KETONES UR: NEGATIVE mg/dL
Leukocytes, UA: NEGATIVE
Nitrite: NEGATIVE
Protein, ur: NEGATIVE mg/dL
Specific Gravity, Urine: 1.023 (ref 1.005–1.030)
UROBILINOGEN UA: 0.2 mg/dL (ref 0.0–1.0)
pH: 5 (ref 5.0–8.0)

## 2013-05-24 LAB — HEPATIC FUNCTION PANEL
ALT: 50 U/L — AB (ref 0–35)
AST: 33 U/L (ref 0–37)
Albumin: 4.4 g/dL (ref 3.5–5.2)
Alkaline Phosphatase: 44 U/L (ref 39–117)
BILIRUBIN INDIRECT: 0.5 mg/dL (ref 0.2–1.2)
BILIRUBIN TOTAL: 0.6 mg/dL (ref 0.2–1.2)
Bilirubin, Direct: 0.1 mg/dL (ref 0.0–0.3)
Total Protein: 7.3 g/dL (ref 6.0–8.3)

## 2013-05-24 LAB — IRON AND TIBC
%SAT: 18 % — ABNORMAL LOW (ref 20–55)
IRON: 74 ug/dL (ref 42–145)
TIBC: 421 ug/dL (ref 250–470)
UIBC: 347 ug/dL (ref 125–400)

## 2013-05-24 LAB — BASIC METABOLIC PANEL WITH GFR
BUN: 14 mg/dL (ref 6–23)
CHLORIDE: 101 meq/L (ref 96–112)
CO2: 26 mEq/L (ref 19–32)
Calcium: 10 mg/dL (ref 8.4–10.5)
Creat: 0.89 mg/dL (ref 0.50–1.10)
GFR, EST AFRICAN AMERICAN: 89 mL/min
GFR, EST NON AFRICAN AMERICAN: 77 mL/min
Glucose, Bld: 114 mg/dL — ABNORMAL HIGH (ref 70–99)
POTASSIUM: 4.5 meq/L (ref 3.5–5.3)
SODIUM: 142 meq/L (ref 135–145)

## 2013-05-24 LAB — URINALYSIS, MICROSCOPIC ONLY
BACTERIA UA: NONE SEEN
Casts: NONE SEEN
Crystals: NONE SEEN

## 2013-05-24 LAB — LIPID PANEL
CHOL/HDL RATIO: 4.6 ratio
CHOLESTEROL: 193 mg/dL (ref 0–200)
HDL: 42 mg/dL (ref >39)
LDL CALC: 98 mg/dL (ref 0–99)
Triglycerides: 266 mg/dL — ABNORMAL HIGH (ref <150)
VLDL: 53 mg/dL — AB (ref 0–40)

## 2013-05-24 LAB — MICROALBUMIN / CREATININE URINE RATIO
Creatinine, Urine: 78.1 mg/dL
MICROALB/CREAT RATIO: 6.4 mg/g (ref 0.0–30.0)
Microalb, Ur: 0.5 mg/dL (ref 0.00–1.89)

## 2013-05-24 LAB — MAGNESIUM: MAGNESIUM: 2.1 mg/dL (ref 1.5–2.5)

## 2013-05-24 LAB — TSH: TSH: 1.529 u[IU]/mL (ref 0.350–4.500)

## 2013-05-24 LAB — VITAMIN D 25 HYDROXY (VIT D DEFICIENCY, FRACTURES): Vit D, 25-Hydroxy: 64 ng/mL (ref 30–89)

## 2013-05-24 LAB — INSULIN, FASTING: INSULIN FASTING, SERUM: 52 u[IU]/mL — AB (ref 3–28)

## 2013-05-24 LAB — VITAMIN B12: VITAMIN B 12: 397 pg/mL (ref 211–911)

## 2013-05-24 MED ORDER — LISINOPRIL-HYDROCHLOROTHIAZIDE 20-12.5 MG PO TABS: 1.0000 | ORAL_TABLET | Freq: Every day | ORAL | Status: DC

## 2013-05-24 MED ORDER — METFORMIN HCL 500 MG PO TABS: 500.0000 mg | ORAL_TABLET | Freq: Three times a day (TID) | ORAL | Status: DC

## 2013-05-27 ENCOUNTER — Other Ambulatory Visit: Payer: Self-pay | Admitting: Emergency Medicine

## 2013-05-27 LAB — TB SKIN TEST
Induration: 0 mm
TB SKIN TEST: NEGATIVE

## 2013-05-27 MED ORDER — METFORMIN HCL ER (MOD) 500 MG PO TB24: ORAL_TABLET | ORAL | Status: DC

## 2013-05-29 NOTE — Progress Notes (Signed)
LMOM

## 2013-06-04 ENCOUNTER — Encounter: Payer: Self-pay | Admitting: *Deleted

## 2013-06-04 ENCOUNTER — Encounter: Payer: Self-pay | Admitting: Emergency Medicine

## 2013-06-05 ENCOUNTER — Other Ambulatory Visit: Payer: Self-pay | Admitting: Emergency Medicine

## 2013-06-05 DIAGNOSIS — R109 Unspecified abdominal pain: Secondary | ICD-10-CM

## 2013-06-05 NOTE — Progress Notes (Signed)
#  2   = LMOM TO CALL

## 2013-06-06 ENCOUNTER — Ambulatory Visit (INDEPENDENT_AMBULATORY_CARE_PROVIDER_SITE_OTHER): Payer: BC Managed Care – PPO | Admitting: Nurse Practitioner

## 2013-06-06 ENCOUNTER — Encounter: Payer: Self-pay | Admitting: Nurse Practitioner

## 2013-06-06 VITALS — BP 112/80 | HR 84 | Ht 63.75 in | Wt 242.5 lb

## 2013-06-06 DIAGNOSIS — R141 Gas pain: Secondary | ICD-10-CM

## 2013-06-06 DIAGNOSIS — R143 Flatulence: Secondary | ICD-10-CM

## 2013-06-06 DIAGNOSIS — R142 Eructation: Secondary | ICD-10-CM

## 2013-06-06 DIAGNOSIS — R109 Unspecified abdominal pain: Secondary | ICD-10-CM

## 2013-06-06 DIAGNOSIS — R140 Abdominal distension (gaseous): Secondary | ICD-10-CM

## 2013-06-06 MED ORDER — SUCRALFATE 1 GM/10ML PO SUSP: 1.0000 g | Freq: Three times a day (TID) | ORAL | Status: DC

## 2013-06-06 NOTE — Progress Notes (Signed)
     History of Present Illness:   Patient is a pleasant 48 year old female previously evaluated by [place] one year ago for abdominal pain, bloating, belching and iron deficiency. HIDA, ultrasound, CTscan, upper and lower endoscopies (Dr. Fuller Plan) were unrevealing. normal. Patient comes in today for evaluation of same symptoms as she never really got better. Patient thought bloating / belching were from CPAP but symptoms are sporadic despite daily use of CPAP. Symptoms occur about every two weeks and last a few days at a time. Symptoms don't correlate with constipation, particular foods or anything else. No weight loss, at least in the last year.     Patient reports B12 deficiency, Vitamin D deficiency and also has a history of iron deficiency. Small bowel biopsies negative for celiac.   Current Medications, Allergies, Past Medical History, Past Surgical History, Family History and Social History were reviewed in [employer] record.  Physical Exam: General: Pleasant, well developed , white female in no acute distress Head: Normocephalic and atraumatic Eyes:  sclerae anicteric, conjunctiva pink  Ears: Normal auditory acuity Lungs: Clear throughout to auscultation Heart: Regular rate and rhythm Abdomen: Soft, non distended, non-tender. No masses, no hepatomegaly. Normal bowel sounds Musculoskeletal: Symmetrical with no gross deformities  Extremities: No edema  Neurological: Alert oriented x 4, grossly nonfocal Psychological:  Alert and cooperative. Normal mood and affect   Colonoscopy April 2014 ENDOSCOPIC IMPRESSION:  1. Pedunculated polyp measuring 8 mm in the distal transverse  colon; polypectomy performed using snare cautery  2. Erosions in the ascending and transverse colon; multiple  biopsies performed  3. 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 - NO DYSPLASIA OR MALIGNANCY IDENTIFIED. - SEE COMMENT 2. Surgical [P], distal tranverse, polyp - TUBULOVILLOUS ADENOMA. - NEGATIVE FOR HIGH GRADE DYSPLASIA. - POLYP BASE IS FREE OF DYSPLASIA. 3. Surgical [P], small bowel, biopsy - BENIGN DUODENAL MUCOSA. - NO CHANGES OF SPRUE, ACTIVE INFLAMMATION OR GRANULOMAS IDENTIFIED. 4. Surgical [P], fundic nodule, fundic polyps and body of stomach, biopsy - FRAGMENTS OF BENIGN OXYNTIC MUCOSA. - FUNDIC GLAND POLYP(S). - NEGATIVE FOR H. PYLORI, DYSPLASIA OR MALIGNANCY. Microscopic Comment 1. The biopsy shows focal cryptitis, crypt abscesses and apthoid type erosions A basal lymphoplasmacytic infiltrate is not appreciated. The differential diagnosis included drug/NSAID and inflammatory bowel disease. An infectious colitis is also included in the differential but is not favored. Dr. Donato Heinz has reviewed part I of this case and agrees with the interpretation.  Assessment and Recommendations:  1, Chronic intermittent boating / belching / diffuse abdominal pain. Negative workup within last couple of years. Small bowel biopsies negative.  On chronic PPI. Suspect symptoms are functional which I did explain to her. Would like to try her on FODMAP diet. Trial of Carafate as well. If no improvement in next few weeks will try course of probiotics.    2. Midly elevated ALT of 50. Ultrasound 2013 suggests fatty liver disease.   3. 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   4. Colon erosions in ascending and transverse colon on colonoscopy last year. Biopsies c/w focal active colitis. Patient inquiring about this. She wasn't on any NSAIDS. Sometimes erosions can be seen after bowel prep. She has no bowel habit changes or rectal bleeding so suspect findings nonspecific.

## 2013-06-06 NOTE — Patient Instructions (Signed)
FOD MAP Diet given for your review   We have sent the following medications to your pharmacy for you to pick up at your convenience: Carafate

## 2013-06-07 NOTE — Progress Notes (Signed)
Reviewed and agree with management plan.  Magdalen Cabana T. Daemon Dowty, MD FACG 

## 2013-06-15 ENCOUNTER — Other Ambulatory Visit: Payer: Self-pay | Admitting: Emergency Medicine

## 2013-06-15 ENCOUNTER — Encounter: Payer: Self-pay | Admitting: Emergency Medicine

## 2013-06-17 ENCOUNTER — Other Ambulatory Visit: Payer: Self-pay | Admitting: Emergency Medicine

## 2013-06-17 MED ORDER — DAPAGLIFLOZIN PROPANEDIOL 10 MG PO TABS: 10.0000 mg | ORAL_TABLET | Freq: Every day | ORAL | Status: DC

## 2013-06-17 MED ORDER — PRAVASTATIN SODIUM 40 MG PO TABS: ORAL_TABLET | ORAL | Status: DC

## 2013-06-17 MED ORDER — LISINOPRIL-HYDROCHLOROTHIAZIDE 20-12.5 MG PO TABS: 1.0000 | ORAL_TABLET | Freq: Every day | ORAL | Status: DC

## 2013-06-17 MED ORDER — OMEPRAZOLE 20 MG PO CPDR: DELAYED_RELEASE_CAPSULE | ORAL | Status: DC

## 2013-06-19 ENCOUNTER — Telehealth: Payer: Self-pay | Admitting: Internal Medicine

## 2013-06-19 NOTE — Telephone Encounter (Signed)
Left message for the patient to call and schedule a follow up appointment. ° °Thanks, °Katrina ° °

## 2013-06-19 NOTE — Telephone Encounter (Signed)
Message copied by Oliver Pila on Wed Jun 19, 2013  4:43 PM ------      Message from: Reed, Lenna Sciara R      Created: Mon May 27, 2013  8:51 AM       Iron mild low increase green leafy veggies. B12 low end of normal, add super complex may help with energy, weight loss. Triglycerides elevated decrease Carbohydrates including alcohol and fried foods. Add Fish oil 1000mg . Insulin/ A1c (3 month blood sugar average) elevated need decreased Carbohydrates, increase Cardio, and maintain healthy weight. IF DOES NOT IMPROVE WILL NEED MORE prescriptions. Liver enzymes still mild elevated could be from virus/ infection, obesity, Alcohol, pain meds, or cholesterol build up. Really need to decrease all of the previously mentioned. Needs 3 month office visit       ------

## 2013-08-08 ENCOUNTER — Other Ambulatory Visit: Payer: Self-pay | Admitting: Obstetrics and Gynecology

## 2013-09-25 ENCOUNTER — Other Ambulatory Visit: Payer: Self-pay | Admitting: Emergency Medicine

## 2013-09-30 ENCOUNTER — Other Ambulatory Visit: Payer: Self-pay | Admitting: Emergency Medicine

## 2013-09-30 ENCOUNTER — Other Ambulatory Visit: Payer: Self-pay | Admitting: Internal Medicine

## 2013-09-30 ENCOUNTER — Encounter: Payer: Self-pay | Admitting: Emergency Medicine

## 2013-09-30 MED ORDER — LISINOPRIL 20 MG PO TABS: ORAL_TABLET | ORAL | Status: DC

## 2013-09-30 MED ORDER — LISINOPRIL-HYDROCHLOROTHIAZIDE 20-12.5 MG PO TABS: 1.0000 | ORAL_TABLET | Freq: Every day | ORAL | Status: DC

## 2013-09-30 MED ORDER — SUCRALFATE 1 GM/10ML PO SUSP: 1.0000 g | Freq: Three times a day (TID) | ORAL | Status: DC

## 2013-09-30 MED ORDER — PRAVASTATIN SODIUM 40 MG PO TABS: ORAL_TABLET | ORAL | Status: DC

## 2013-09-30 MED ORDER — DAPAGLIFLOZIN PROPANEDIOL 10 MG PO TABS: 10.0000 mg | ORAL_TABLET | Freq: Every day | ORAL | Status: DC

## 2013-09-30 MED ORDER — METFORMIN HCL ER (MOD) 500 MG PO TB24: ORAL_TABLET | ORAL | Status: DC

## 2013-09-30 MED ORDER — OMEPRAZOLE 20 MG PO CPDR
DELAYED_RELEASE_CAPSULE | ORAL | Status: DC
Start: 2013-09-30 — End: 2014-05-01

## 2013-10-10 ENCOUNTER — Other Ambulatory Visit: Payer: Self-pay | Admitting: Emergency Medicine

## 2013-10-22 ENCOUNTER — Ambulatory Visit (INDEPENDENT_AMBULATORY_CARE_PROVIDER_SITE_OTHER): Payer: BC Managed Care – PPO | Admitting: Physician Assistant

## 2013-10-22 ENCOUNTER — Encounter: Payer: Self-pay | Admitting: Physician Assistant

## 2013-10-22 VITALS — BP 138/88 | HR 80 | Temp 98.0°F | Resp 16 | Ht 64.5 in | Wt 241.0 lb

## 2013-10-22 DIAGNOSIS — K7689 Other specified diseases of liver: Secondary | ICD-10-CM

## 2013-10-22 DIAGNOSIS — E785 Hyperlipidemia, unspecified: Secondary | ICD-10-CM

## 2013-10-22 DIAGNOSIS — E559 Vitamin D deficiency, unspecified: Secondary | ICD-10-CM

## 2013-10-22 DIAGNOSIS — I10 Essential (primary) hypertension: Secondary | ICD-10-CM

## 2013-10-22 DIAGNOSIS — Z79899 Other long term (current) drug therapy: Secondary | ICD-10-CM

## 2013-10-22 DIAGNOSIS — E119 Type 2 diabetes mellitus without complications: Secondary | ICD-10-CM

## 2013-10-22 DIAGNOSIS — K760 Fatty (change of) liver, not elsewhere classified: Secondary | ICD-10-CM

## 2013-10-22 DIAGNOSIS — D509 Iron deficiency anemia, unspecified: Secondary | ICD-10-CM

## 2013-10-22 DIAGNOSIS — E669 Obesity, unspecified: Secondary | ICD-10-CM

## 2013-10-22 LAB — CBC WITH DIFFERENTIAL/PLATELET
BASOS ABS: 0.1 10*3/uL (ref 0.0–0.1)
Basophils Relative: 1 % (ref 0–1)
EOS ABS: 0.1 10*3/uL (ref 0.0–0.7)
EOS PCT: 2 % (ref 0–5)
HCT: 42.2 % (ref 36.0–46.0)
Hemoglobin: 14.6 g/dL (ref 12.0–15.0)
LYMPHS PCT: 55 % — AB (ref 12–46)
Lymphs Abs: 3.5 10*3/uL (ref 0.7–4.0)
MCH: 28 pg (ref 26.0–34.0)
MCHC: 34.6 g/dL (ref 30.0–36.0)
MCV: 81 fL (ref 78.0–100.0)
Monocytes Absolute: 0.4 10*3/uL (ref 0.1–1.0)
Monocytes Relative: 7 % (ref 3–12)
Neutro Abs: 2.2 10*3/uL (ref 1.7–7.7)
Neutrophils Relative %: 35 % — ABNORMAL LOW (ref 43–77)
PLATELETS: 311 10*3/uL (ref 150–400)
RBC: 5.21 MIL/uL — ABNORMAL HIGH (ref 3.87–5.11)
RDW: 14 % (ref 11.5–15.5)
WBC: 6.4 10*3/uL (ref 4.0–10.5)

## 2013-10-22 MED ORDER — ALPRAZOLAM 0.5 MG PO TABS: 0.5000 mg | ORAL_TABLET | Freq: Three times a day (TID) | ORAL | Status: DC | PRN

## 2013-10-22 NOTE — Patient Instructions (Addendum)
Benefiber is good for constipation/diarrhea/irritable bowel syndrome, it helps with weight loss and can help lower your bad cholesterol. Please do 1-2 TBSP in the morning in water, coffee, or tea. It can take up to a month before you can see a difference with your bowel movements. It is cheapest from [employer], [employer], [employer].     Bad carbs also include fruit juice, alcohol, and sweet tea. These are empty calories that do not signal to your brain that you are full.   Please remember the good carbs are still carbs which convert into sugar. So please measure them out no more than 1/2-1 cup of rice, oatmeal, pasta, and beans.  Veggies are however free foods! Pile them on.   I like lean protein at every meal such as chicken, [place], pork chops, cottage cheese, etc. Just do not fry these meats and please center your meal around vegetable, the meats should be a side dish.   No all fruit is created equal. Please see the list below, the fruit at the bottom is higher in sugars than the fruit at the top   We are starting you on Metformin to prevent or treat diabetes. Metformin does not cause low blood sugars. In order to create energy your cells need insulin and sugar but sometime your cells do not accept the insulin and this can cause increased sugars and decreased energy. The Metformin helps your cells accept insulin and the sugar to give you more energy.   The two most common side effects are nausea and diarrhea, follow these rules to avoid it! You can take imodium per box instructions when starting metformin if needed.   Rules of metformin: 1) start out slow with only one pill daily. Our goal for you is 4 pills a day or 2000mg  total.  2) take with your largest meal. 3) Take with least amount of carbs.   Call if you have any problems.

## 2013-10-22 NOTE — Progress Notes (Signed)
Assessment and Plan:  Hypertension: Continue medication, monitor blood pressure at home. Continue DASH diet. Cholesterol: Continue diet and exercise. Check cholesterol.  Diabetes-Continue diet and exercise. Check A1C Vitamin D Def- check level and continue medications.  Anxiety/stress- Xanax 0.5mg  #30 1 RF  Continue diet and meds as discussed. Further disposition pending results of labs. Discussed med's effects and SE's.    HPI 48 y.o. female  presents for 3 month follow up with hypertension, hyperlipidemia, diabetes and vitamin D. Her blood pressure has been controlled at home, today their BP is BP: 138/88 mmHg She does workout. She denies chest pain, shortness of breath, dizziness.  She is on cholesterol medication and denies myalgias. Her cholesterol is at goal. The cholesterol last visit was:   Lab Results  Component Value Date   CHOL 193 05/23/2013   HDL 42 05/23/2013   LDLCALC 98 05/23/2013   TRIG 266* 05/23/2013   CHOLHDL 4.6 05/23/2013   She has been working on diet and exercise for Diabetes, she is on invokana and metformin, she is on an ACE, and denies paresthesia of the feet, polydipsia and polyuria. Last A1C in the office was:  Lab Results  Component Value Date   HGBA1C 7.2* 05/23/2013   Patient is on Vitamin D supplement. 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   Current Medications:  Current Outpatient Prescriptions on File Prior to Visit  Medication Sig Dispense Refill  . Cholecalciferol (VITAMIN D-3) 5000 UNITS TABS Take by mouth daily.      . Dapagliflozin Propanediol (FARXIGA) 10 MG TABS Take 10 mg by mouth daily.  30 tablet  6  . lisinopril (PRINIVIL,ZESTRIL) 20 MG tablet TAKE 2 TABLETS (40 MG TOTAL) BY MOUTH DAILY.  60 tablet  0  . lisinopril-hydrochlorothiazide (PRINZIDE,ZESTORETIC) 20-12.5 MG per tablet Take 1 tablet by  mouth daily.  90 tablet  1  . Magnesium 500 MG CAPS Take 1 capsule by mouth daily. Taking 950mg  daily      . metFORMIN (GLUCOPHAGE-XR) 500 MG 24 hr tablet TAKE (2) TABLETS TWICE DAILY.  120 tablet  2  . milk thistle 175 MG tablet Take 175 mg by mouth 2 (two) times daily.        Marland Kitchen omeprazole (PRILOSEC) 20 MG capsule TAKE 1 TABLET ONCE DAILY.  30 capsule  6  . pravastatin (PRAVACHOL) 40 MG tablet TAKE 1 TABLET DAILY.  30 tablet  6  . sucralfate (CARAFATE) 1 GM/10ML suspension Take 10 mLs (1 g total) by mouth 3 (three) times daily with meals.  420 mL  1   No current facility-administered medications on file prior to visit.   Medical History:  Past Medical History  Diagnosis Date  . Hypertension   . OSA on CPAP   . Vitamin D deficiency   . Iron deficiency   . Exercise-induced asthma   . Diabetes mellitus   . Hyperlipidemia   . Kidney stones   . Hernia   . Obesity   . Colon polyps 07/11/2012    TUBULOVILLOUS ADENOMA  . Colitis 07/11/2012   Allergies:  Allergies  Allergen Reactions  . Welchol [Colesevelam Hcl]     HIves     Review of Systems: [X]  = complains of  [ ]  = denies  General: Fatigue [ ]  Fever [ ]   Chills [ ]  Weakness [ ]   Insomnia [ ]  Eyes: Redness [ ]  Blurred vision [ ]  Diplopia [ ]   ENT: Congestion [ ]  Sinus Pain [ ]  Post Nasal Drip [ ]  Sore Throat [ ]  Earache [ ]   Cardiac: Chest pain/pressure [ ]  SOB [ ]  Orthopnea [ ]   Palpitations [ ]   Paroxysmal nocturnal dyspnea[ ]  Claudication [ ]  Edema [ ]   Pulmonary: Cough [ ]  Wheezing[ ]   SOB [ ]   Snoring [ ]   GI: Nausea [ ]  Vomiting[ ]  Dysphagia[ ]  Heartburn[ ]  Abdominal pain [ ]  Constipation [ ] ; Diarrhea [ ] ; BRBPR [ ]  Melena[ ]  GU: Hematuria[ ]  Dysuria [ ]  Nocturia[ ]  Urgency [ ]   Hesitancy [ ]  Discharge [ ]  Neuro: Headaches[ ]  Vertigo[ ]  Paresthesias[ ]  Spasm [ ]  Speech changes [ ]  Incoordination [ ]   Ortho: Arthritis [ ]  Joint pain [ ]  Muscle pain [ ]  Joint swelling [ ]  Back Pain [ ]  Skin:  Rash [ ]   Pruritis [ ]  Change  in skin lesion [ ]   Psych: Depression[ ]  Anxiety[x ] Confusion [ ]  Memory loss [ ]   Heme/Lypmh: Bleeding [ ]  Bruising [ ]  Enlarged lymph nodes [ ]   Endocrine: Visual blurring [ ]  Paresthesia [ ]  Polyuria [ ]  Polydypsea [ ]    Heat/cold intolerance [ ]  Hypoglycemia [ ]   Family history- Review and unchanged Social history- Review and unchanged Physical Exam: BP 138/88  Pulse 80  Temp(Src) 98 F (36.7 C)  Resp 16  Ht 5' 4.5" (1.638 m)  Wt 241 lb (109.317 kg)  BMI 40.74 kg/m2 Wt Readings from Last 3 Encounters:  10/22/13 241 lb (109.317 kg)  06/06/13 242 lb 8 oz (109.997 kg)  05/23/13 240 lb (108.863 kg)   General Appearance: Well nourished, in no apparent distress. Eyes: PERRLA, EOMs, conjunctiva no swelling or erythema Sinuses: No Frontal/maxillary tenderness ENT/Mouth: Ext aud canals clear, TMs without erythema, bulging. No erythema, swelling, or exudate on post pharynx.  Tonsils not swollen or erythematous. Hearing normal.  Neck: Supple, thyroid normal.  Respiratory: Respiratory effort normal, BS equal bilaterally without rales, rhonchi, wheezing or stridor.  Cardio: RRR with no MRGs. Brisk peripheral pulses without edema.  Abdomen: Soft, + BS.  Non tender, no guarding, rebound, hernias, masses. Lymphatics: Non tender without lymphadenopathy.  Musculoskeletal: Full ROM, 5/5 strength, normal gait.  Skin: Warm, dry without rashes, lesions, ecchymosis.  Neuro: Cranial nerves intact. No cerebellar symptoms. Sensation intact.  Psych: Awake and oriented X 3, normal affect, Insight and Judgment appropriate.    Vicie Mutters 4:18 PM

## 2013-10-23 ENCOUNTER — Encounter: Payer: Self-pay | Admitting: Physician Assistant

## 2013-10-23 LAB — BASIC METABOLIC PANEL WITH GFR
BUN: 16 mg/dL (ref 6–23)
CHLORIDE: 99 meq/L (ref 96–112)
CO2: 27 mEq/L (ref 19–32)
Calcium: 9.9 mg/dL (ref 8.4–10.5)
Creat: 0.8 mg/dL (ref 0.50–1.10)
GFR, Est Non African American: 88 mL/min
Glucose, Bld: 142 mg/dL — ABNORMAL HIGH (ref 70–99)
Potassium: 3.9 mEq/L (ref 3.5–5.3)
Sodium: 139 mEq/L (ref 135–145)

## 2013-10-23 LAB — HEPATIC FUNCTION PANEL
ALT: 76 U/L — ABNORMAL HIGH (ref 0–35)
AST: 51 U/L — ABNORMAL HIGH (ref 0–37)
Albumin: 4.7 g/dL (ref 3.5–5.2)
Alkaline Phosphatase: 54 U/L (ref 39–117)
BILIRUBIN TOTAL: 0.5 mg/dL (ref 0.2–1.2)
Bilirubin, Direct: 0.1 mg/dL (ref 0.0–0.3)
Indirect Bilirubin: 0.4 mg/dL (ref 0.2–1.2)
Total Protein: 7.2 g/dL (ref 6.0–8.3)

## 2013-10-23 LAB — LIPID PANEL
CHOL/HDL RATIO: 4.6 ratio
CHOLESTEROL: 187 mg/dL (ref 0–200)
HDL: 41 mg/dL (ref >39)
LDL Cholesterol: 82 mg/dL (ref 0–99)
Triglycerides: 320 mg/dL — ABNORMAL HIGH (ref <150)
VLDL: 64 mg/dL — ABNORMAL HIGH (ref 0–40)

## 2013-10-23 LAB — MAGNESIUM: MAGNESIUM: 2.1 mg/dL (ref 1.5–2.5)

## 2013-10-23 LAB — HEMOGLOBIN A1C
Hgb A1c MFr Bld: 8.2 % — ABNORMAL HIGH (ref <5.7)
Mean Plasma Glucose: 189 mg/dL — ABNORMAL HIGH (ref <117)

## 2013-10-23 LAB — INSULIN, FASTING: Insulin fasting, serum: 85 u[IU]/mL — ABNORMAL HIGH (ref 3–28)

## 2013-10-23 LAB — VITAMIN D 25 HYDROXY (VIT D DEFICIENCY, FRACTURES): VIT D 25 HYDROXY: 57 ng/mL (ref 30–89)

## 2013-10-23 LAB — TSH: TSH: 1.854 u[IU]/mL (ref 0.350–4.500)

## 2013-10-30 ENCOUNTER — Encounter: Payer: Self-pay | Admitting: Physician Assistant

## 2013-10-30 ENCOUNTER — Emergency Department (HOSPITAL_BASED_OUTPATIENT_CLINIC_OR_DEPARTMENT_OTHER): Payer: BC Managed Care – PPO

## 2013-10-30 ENCOUNTER — Encounter (HOSPITAL_BASED_OUTPATIENT_CLINIC_OR_DEPARTMENT_OTHER): Payer: Self-pay | Admitting: Emergency Medicine

## 2013-10-30 ENCOUNTER — Emergency Department (HOSPITAL_BASED_OUTPATIENT_CLINIC_OR_DEPARTMENT_OTHER)
Admission: EM | Admit: 2013-10-30 | Discharge: 2013-10-30 | Disposition: A | Discharge status: Home / Self Care | Payer: BC Managed Care – PPO | Attending: Emergency Medicine | Admitting: Emergency Medicine

## 2013-10-30 DIAGNOSIS — J45909 Unspecified asthma, uncomplicated: Secondary | ICD-10-CM | POA: Diagnosis not present

## 2013-10-30 DIAGNOSIS — Z79899 Other long term (current) drug therapy: Secondary | ICD-10-CM | POA: Insufficient documentation

## 2013-10-30 DIAGNOSIS — E119 Type 2 diabetes mellitus without complications: Secondary | ICD-10-CM | POA: Diagnosis not present

## 2013-10-30 DIAGNOSIS — I10 Essential (primary) hypertension: Secondary | ICD-10-CM | POA: Insufficient documentation

## 2013-10-30 DIAGNOSIS — Z9889 Other specified postprocedural states: Secondary | ICD-10-CM | POA: Diagnosis not present

## 2013-10-30 DIAGNOSIS — Z87442 Personal history of urinary calculi: Secondary | ICD-10-CM | POA: Diagnosis not present

## 2013-10-30 DIAGNOSIS — Z862 Personal history of diseases of the blood and blood-forming organs and certain disorders involving the immune mechanism: Secondary | ICD-10-CM | POA: Diagnosis not present

## 2013-10-30 DIAGNOSIS — E785 Hyperlipidemia, unspecified: Secondary | ICD-10-CM | POA: Diagnosis not present

## 2013-10-30 DIAGNOSIS — Z8719 Personal history of other diseases of the digestive system: Secondary | ICD-10-CM | POA: Diagnosis not present

## 2013-10-30 DIAGNOSIS — Z9981 Dependence on supplemental oxygen: Secondary | ICD-10-CM | POA: Insufficient documentation

## 2013-10-30 DIAGNOSIS — E669 Obesity, unspecified: Secondary | ICD-10-CM | POA: Insufficient documentation

## 2013-10-30 DIAGNOSIS — R1013 Epigastric pain: Secondary | ICD-10-CM | POA: Diagnosis present

## 2013-10-30 DIAGNOSIS — R110 Nausea: Secondary | ICD-10-CM | POA: Insufficient documentation

## 2013-10-30 DIAGNOSIS — E559 Vitamin D deficiency, unspecified: Secondary | ICD-10-CM | POA: Diagnosis not present

## 2013-10-30 DIAGNOSIS — Z860100 Personal history of colon polyps, unspecified: Secondary | ICD-10-CM | POA: Insufficient documentation

## 2013-10-30 DIAGNOSIS — Z87891 Personal history of nicotine dependence: Secondary | ICD-10-CM | POA: Diagnosis not present

## 2013-10-30 DIAGNOSIS — G4733 Obstructive sleep apnea (adult) (pediatric): Secondary | ICD-10-CM | POA: Insufficient documentation

## 2013-10-30 LAB — CBC WITH DIFFERENTIAL/PLATELET
BASOS PCT: 0 % (ref 0–1)
Basophils Absolute: 0 10*3/uL (ref 0.0–0.1)
Eosinophils Absolute: 0.2 10*3/uL (ref 0.0–0.7)
Eosinophils Relative: 2 % (ref 0–5)
HCT: 44.7 % (ref 36.0–46.0)
HEMOGLOBIN: 15.1 g/dL — AB (ref 12.0–15.0)
Lymphocytes Relative: 36 % (ref 12–46)
Lymphs Abs: 3 10*3/uL (ref 0.7–4.0)
MCH: 28.6 pg (ref 26.0–34.0)
MCHC: 33.8 g/dL (ref 30.0–36.0)
MCV: 84.7 fL (ref 78.0–100.0)
MONO ABS: 0.5 10*3/uL (ref 0.1–1.0)
MONOS PCT: 6 % (ref 3–12)
NEUTROS PCT: 55 % (ref 43–77)
Neutro Abs: 4.6 10*3/uL (ref 1.7–7.7)
Platelets: 266 10*3/uL (ref 150–400)
RBC: 5.28 MIL/uL — ABNORMAL HIGH (ref 3.87–5.11)
RDW: 13.6 % (ref 11.5–15.5)
WBC: 8.4 10*3/uL (ref 4.0–10.5)

## 2013-10-30 LAB — URINALYSIS, ROUTINE W REFLEX MICROSCOPIC
Bilirubin Urine: NEGATIVE
HGB URINE DIPSTICK: NEGATIVE
KETONES UR: NEGATIVE mg/dL
Leukocytes, UA: NEGATIVE
Nitrite: NEGATIVE
PROTEIN: NEGATIVE mg/dL
Specific Gravity, Urine: 1.033 — ABNORMAL HIGH (ref 1.005–1.030)
Urobilinogen, UA: 0.2 mg/dL (ref 0.0–1.0)
pH: 5 (ref 5.0–8.0)

## 2013-10-30 LAB — COMPREHENSIVE METABOLIC PANEL
ALT: 73 U/L — AB (ref 0–35)
AST: 52 U/L — ABNORMAL HIGH (ref 0–37)
Albumin: 4 g/dL (ref 3.5–5.2)
Alkaline Phosphatase: 62 U/L (ref 39–117)
Anion gap: 15 (ref 5–15)
BILIRUBIN TOTAL: 0.5 mg/dL (ref 0.3–1.2)
BUN: 14 mg/dL (ref 6–23)
CHLORIDE: 98 meq/L (ref 96–112)
CO2: 26 meq/L (ref 19–32)
Calcium: 10 mg/dL (ref 8.4–10.5)
Creatinine, Ser: 0.8 mg/dL (ref 0.50–1.10)
GFR calc non Af Amer: 86 mL/min — ABNORMAL LOW (ref >90)
GLUCOSE: 176 mg/dL — AB (ref 70–99)
POTASSIUM: 3.8 meq/L (ref 3.7–5.3)
Sodium: 139 mEq/L (ref 137–147)
TOTAL PROTEIN: 7.7 g/dL (ref 6.0–8.3)

## 2013-10-30 LAB — URINE MICROSCOPIC-ADD ON

## 2013-10-30 LAB — LIPASE, BLOOD: Lipase: 52 U/L (ref 11–59)

## 2013-10-30 MED ORDER — IOHEXOL 300 MG/ML  SOLN
50.0000 mL | Freq: Once | INTRAMUSCULAR | Status: AC | PRN
  Administered 2013-10-30: 50 mL via ORAL

## 2013-10-30 MED ORDER — ONDANSETRON HCL 4 MG/2ML IJ SOLN
4.0000 mg | Freq: Once | INTRAMUSCULAR | Status: AC
  Administered 2013-10-30: 4 mg via INTRAVENOUS
  Filled 2013-10-30: qty 2

## 2013-10-30 MED ORDER — HYDROMORPHONE HCL PF 1 MG/ML IJ SOLN
1.0000 mg | Freq: Once | INTRAMUSCULAR | Status: AC
  Administered 2013-10-30: 1 mg via INTRAVENOUS
  Filled 2013-10-30: qty 1

## 2013-10-30 MED ORDER — MORPHINE SULFATE 4 MG/ML IJ SOLN
4.0000 mg | Freq: Once | INTRAMUSCULAR | Status: AC
  Administered 2013-10-30: 4 mg via INTRAVENOUS
  Filled 2013-10-30: qty 1

## 2013-10-30 MED ORDER — IOHEXOL 300 MG/ML  SOLN
100.0000 mL | Freq: Once | INTRAMUSCULAR | Status: AC | PRN
  Administered 2013-10-30: 100 mL via INTRAVENOUS

## 2013-10-30 MED ORDER — OXYCODONE-ACETAMINOPHEN 5-325 MG PO TABS: 1.0000 | ORAL_TABLET | Freq: Four times a day (QID) | ORAL | Status: DC | PRN

## 2013-10-30 NOTE — ED Notes (Signed)
Pt. States that she began having epigastric abd pain yesterday. Hx of intermittent abd pain and bloating, hx of colitis. Denies diarrhea. Nausea but no vomiting.

## 2013-10-30 NOTE — ED Notes (Signed)
Pt c/o epigastric abd pain with increased pain after eating  X 2 days

## 2013-10-30 NOTE — Discharge Instructions (Signed)

## 2013-10-30 NOTE — ED Provider Notes (Signed)
CSN: [id number]     Arrival date & time 10/30/13  1418 History   First MD Initiated Contact with Patient 10/30/13 1432     Chief Complaint  Patient presents with  . Abdominal Pain     (Consider location/radiation/quality/duration/timing/severity/associated sxs/prior Treatment) HPI Comments: Pt states that she has been having these episodes intermittently over the last 2 years. Pt states that she has a known gallbladder stone and some sludge. No fever or vomiting.  Patient is a 48 y.o. female presenting with abdominal pain. The history is provided by the patient. No language interpreter was used.  Abdominal Pain Pain location:  Epigastric Pain quality: aching   Pain radiates to:  Does not radiate Pain severity:  Moderate Onset quality:  Gradual Duration:  2 days Timing:  Intermittent Progression:  Unchanged Context: not alcohol use   Relieved by:  Nothing Exacerbated by: eating. Associated symptoms: nausea   Associated symptoms: no fever and no vomiting     Past Medical History  Diagnosis Date  . Hypertension   . OSA on CPAP   . Vitamin D deficiency   . Iron deficiency   . Exercise-induced asthma   . Diabetes mellitus   . Hyperlipidemia   . Kidney stones   . Hernia   . Obesity   . Colon polyps 07/11/2012    TUBULOVILLOUS ADENOMA  . Colitis 07/11/2012   Past Surgical History  Procedure Laterality Date  . Tonsilectomy, adenoidectomy, bilateral myringotomy and tubes    . Ablation    . Abdominoplasty    . Esophagogastroduodenoscopy  07/11/2012    Normal   . Colonoscopy w/ biopsies  07/11/2012   History reviewed. No pertinent family history. History  Substance Use Topics  . Smoking status: Former Smoker -- 1.00 packs/day for 2 years    Quit date: 03/21/1985  . Smokeless tobacco: Never Used     Comment: smoked age [age]  . Alcohol Use: No   OB History   Grav Para Term Preterm Abortions TAB SAB Ect Mult Living                 Review of Systems  Constitutional:  Negative for fever.  Respiratory: Negative.   Cardiovascular: Negative.   Gastrointestinal: Positive for nausea and abdominal pain. Negative for vomiting.      Allergies  Welchol  Home Medications   Prior to Admission medications   Medication Sig Start Date End Date Taking? Authorizing Provider  ALPRAZolam Duanne Moron) 0.5 MG tablet Take 1 tablet (0.5 mg total) by mouth 3 (three) times daily as needed for sleep or anxiety. 10/22/13 10/22/14  Vicie Mutters, PA-C  Cholecalciferol (VITAMIN D-3) 5000 UNITS TABS Take by mouth daily.    Historical Provider, MD  Dapagliflozin Propanediol (FARXIGA) 10 MG TABS Take 10 mg by mouth daily. 09/30/13   Melissa R Smith, PA-C  lisinopril (PRINIVIL,ZESTRIL) 20 MG tablet TAKE 2 TABLETS (40 MG TOTAL) BY MOUTH DAILY. 09/30/13   Melissa R Smith, PA-C  lisinopril-hydrochlorothiazide (PRINZIDE,ZESTORETIC) 20-12.5 MG per tablet Take 1 tablet by mouth daily. 09/30/13   Melissa R Smith, PA-C  Magnesium 500 MG CAPS Take 1 capsule by mouth daily. Taking 950mg  daily    Historical Provider, MD  metFORMIN (GLUCOPHAGE-XR) 500 MG 24 hr tablet TAKE (2) TABLETS TWICE DAILY.    Unk Pinto, MD  milk thistle 175 MG tablet Take 175 mg by mouth 2 (two) times daily.      Historical Provider, MD  omeprazole (PRILOSEC) 20 MG capsule TAKE 1 TABLET  ONCE DAILY. 09/30/13   Melissa R Smith, PA-C  pravastatin (PRAVACHOL) 40 MG tablet TAKE 1 TABLET DAILY. 09/30/13   Melissa R Smith, PA-C  sucralfate (CARAFATE) 1 GM/10ML suspension Take 10 mLs (1 g total) by mouth 3 (three) times daily with meals. 09/30/13   Melissa R Smith, PA-C   BP 160/89  Pulse 80  Temp(Src) 98 F (36.7 C) (Oral)  Resp 16  Ht 5\' 4"  (1.626 m)  Wt 240 lb (108.863 kg)  BMI 41.18 kg/m2  SpO2 100% Physical Exam  Nursing note and vitals reviewed. Constitutional: She is oriented to person, place, and time. She appears well-developed and well-nourished.  HENT:  Head: Normocephalic and atraumatic.  Cardiovascular: Normal  rate and regular rhythm.   Pulmonary/Chest: Effort normal and breath sounds normal.  Abdominal: Soft. Bowel sounds are normal.  Generalized upper abdominal tenderness  Musculoskeletal: Normal range of motion.  Neurological: She is alert and oriented to person, place, and time.  Skin: Skin is warm and dry.    ED Course  Procedures (including critical care time) Labs Review Labs Reviewed  COMPREHENSIVE METABOLIC PANEL - Abnormal; Notable for the following:    Glucose, Bld 176 (*)    AST 52 (*)    ALT 73 (*)    GFR calc non Af Amer 86 (*)    All other components within normal limits  CBC WITH DIFFERENTIAL - Abnormal; Notable for the following:    RBC 5.28 (*)    Hemoglobin 15.1 (*)    All other components within normal limits  URINALYSIS, ROUTINE W REFLEX MICROSCOPIC - Abnormal; Notable for the following:    Specific Gravity, Urine 1.033 (*)    Glucose, UA >1000 (*)    All other components within normal limits  LIPASE, BLOOD  URINE MICROSCOPIC-ADD ON    Imaging Review US Abdomen Complete  10/30/2013   CLINICAL DATA:  Right upper quadrant pain  EXAM: ULTRASOUND ABDOMEN COMPLETE  COMPARISON:  07/05/2012 CT scan abdomen and pelvis  FINDINGS: Gallbladder:  No gallstones or wall thickening visualized. No sonographic Murphy sign noted.  Common bile duct:  Diameter:   3 mm in diameter within normal limits.  Liver:  Significant fatty infiltration of the liver.  No focal hepatic mass.  IVC:  Not visualized due to abundant bowel gas.  Pancreas:  Visualized portion unremarkable.  Spleen:  Size and appearance within normal limits.  Right Kidney:  Length: 12 cm in length. No hydronephrosis. Echogenic focus in midpole measures 3 mm. Echogenic focus in lower pole measures 6 mm. Nonobstructive calcifications cannot be excluded.  Left Kidney:  Length: 11.3 cm in length. No hydronephrosis. Echogenic focus in midpole measures 2 mm. Second echogenic focus in lower pole measures 2 mm.  Abdominal aorta:  No  aneurysm visualized.  Measures up to 2.2 cm in diameter.  Other findings:  None.  IMPRESSION: 1. No gallstones are noted within gallbladder.  Normal CBD. 2. Significant fatty infiltration of the liver. 3. No hydronephrosis.  Question bilateral nephrolithiasis. 4. No aortic aneurysm.   Electronically Signed   By: Lahoma Crocker M.D.   On: 10/30/2013 16:30   Ct Abdomen Pelvis W Contrast  10/30/2013   CLINICAL DATA:  Epigastric pain with nausea.  History of colitis.  EXAM: CT ABDOMEN AND PELVIS WITH CONTRAST  TECHNIQUE: Multidetector CT imaging of the abdomen and pelvis was performed using the standard protocol following bolus administration of intravenous contrast.  CONTRAST:  100 mL Omnipaque  COMPARISON:  CT 07/05/2012  FINDINGS: Lung bases are clear.  No pericardial fluid.  There is diffuse fatty infiltration liver. There is focal fatty sparing within the central left hepatic lobe (image 27, series 2). No biliary duct dilatation. The gallbladder, pancreas, spleen, adrenal glands, and kidneys are normal.  Stomach, small bowel, cecum normal. The colon and rectosigmoid colon are normal.  Abdominal aorta is normal caliber. No retroperitoneal periportal lymphadenopathy.  No free fluid the pelvis. Uterus and ovaries are normal. Bladder is normal. No pelvic lymphadenopathy.  No inguinal hernia.  No significant ventral hernia.  No aggressive osseous lesion.  IMPRESSION: 1. Diffuse hepatic steatosis. 2. No evidence acute inflammation or infection in the abdomen or pelvis.   Electronically Signed   By: Suzy Bouchard M.D.   On: 10/30/2013 19:50     EKG Interpretation None      MDM   Final diagnoses:  Epigastric pain    Pt has had elevated liver enzymes with fatty liver. No acute findings for symptoms. Pt is comfortable at this time. Pt okay with follow up with [place] who she has seen in the past     Glendell Docker, NP 10/30/13 2203

## 2013-10-31 NOTE — ED Provider Notes (Signed)
Medical screening examination/treatment/procedure(s) were performed by non-physician practitioner and as supervising physician I was immediately available for consultation/collaboration.   EKG Interpretation None        Hoy Morn, MD 10/31/13 [phone]

## 2013-11-20 ENCOUNTER — Ambulatory Visit: Payer: BC Managed Care – PPO | Admitting: Gastroenterology

## 2013-12-17 ENCOUNTER — Telehealth: Payer: Self-pay | Admitting: Gastroenterology

## 2013-12-17 NOTE — Telephone Encounter (Signed)
Message copied by Oliva Bustard on Tue Dec 17, 2013  7:19 PM ------      Message from: Marzella Schlein      Created: Wed Nov 20, 2013  9:55 AM       Bill patient for no show today. ------

## 2014-01-03 ENCOUNTER — Other Ambulatory Visit: Payer: Self-pay

## 2014-01-16 ENCOUNTER — Other Ambulatory Visit: Payer: Self-pay | Admitting: Internal Medicine

## 2014-01-23 ENCOUNTER — Other Ambulatory Visit: Payer: Self-pay | Admitting: Internal Medicine

## 2014-02-17 ENCOUNTER — Other Ambulatory Visit: Payer: Self-pay | Admitting: *Deleted

## 2014-03-07 ENCOUNTER — Other Ambulatory Visit: Payer: Self-pay

## 2014-03-07 MED ORDER — LISINOPRIL 20 MG PO TABS: ORAL_TABLET | ORAL | Status: DC

## 2014-03-10 ENCOUNTER — Other Ambulatory Visit: Payer: Self-pay | Admitting: Physician Assistant

## 2014-03-10 MED ORDER — LISINOPRIL 20 MG PO TABS: ORAL_TABLET | ORAL | Status: DC

## 2014-04-06 ENCOUNTER — Other Ambulatory Visit: Payer: Self-pay | Admitting: Internal Medicine

## 2014-04-09 ENCOUNTER — Encounter: Payer: Self-pay | Admitting: Physician Assistant

## 2014-04-09 MED ORDER — GLUCOSE BLOOD VI STRP: ORAL_STRIP | Status: DC

## 2014-04-09 MED ORDER — FREESTYLE SYSTEM KIT: 1.0000 | PACK | Status: DC | PRN

## 2014-04-14 ENCOUNTER — Other Ambulatory Visit: Payer: Self-pay | Admitting: Emergency Medicine

## 2014-04-14 DIAGNOSIS — E119 Type 2 diabetes mellitus without complications: Secondary | ICD-10-CM

## 2014-04-14 MED ORDER — GLUCOSE BLOOD VI STRP: ORAL_STRIP | Status: DC

## 2014-04-24 ENCOUNTER — Encounter: Payer: Self-pay | Admitting: Emergency Medicine

## 2014-04-29 ENCOUNTER — Encounter: Payer: Self-pay | Admitting: Physician Assistant

## 2014-05-01 ENCOUNTER — Other Ambulatory Visit: Payer: Self-pay

## 2014-05-01 MED ORDER — DAPAGLIFLOZIN PROPANEDIOL 10 MG PO TABS: 10.0000 mg | ORAL_TABLET | Freq: Every day | ORAL | Status: DC

## 2014-05-01 MED ORDER — PRAVASTATIN SODIUM 40 MG PO TABS: ORAL_TABLET | ORAL | Status: DC

## 2014-05-01 MED ORDER — LISINOPRIL 20 MG PO TABS: ORAL_TABLET | ORAL | Status: DC

## 2014-05-01 MED ORDER — METFORMIN HCL ER 500 MG PO TB24: ORAL_TABLET | ORAL | Status: DC

## 2014-05-01 MED ORDER — OMEPRAZOLE 20 MG PO CPDR: DELAYED_RELEASE_CAPSULE | ORAL | Status: DC

## 2014-05-16 ENCOUNTER — Other Ambulatory Visit: Payer: Self-pay | Admitting: Internal Medicine

## 2014-05-23 ENCOUNTER — Other Ambulatory Visit: Payer: Self-pay | Admitting: Physician Assistant

## 2014-05-23 MED ORDER — METFORMIN HCL ER 500 MG PO TB24: ORAL_TABLET | ORAL | Status: DC

## 2014-05-26 ENCOUNTER — Encounter: Payer: Self-pay | Admitting: Emergency Medicine

## 2014-06-08 ENCOUNTER — Encounter: Payer: Self-pay | Admitting: *Deleted

## 2014-06-12 ENCOUNTER — Other Ambulatory Visit: Payer: Self-pay | Admitting: Emergency Medicine

## 2014-06-19 ENCOUNTER — Other Ambulatory Visit: Payer: Self-pay | Admitting: Emergency Medicine

## 2014-06-19 ENCOUNTER — Ambulatory Visit (INDEPENDENT_AMBULATORY_CARE_PROVIDER_SITE_OTHER): Payer: Commercial Indemnity | Admitting: Emergency Medicine

## 2014-06-19 ENCOUNTER — Encounter: Payer: Self-pay | Admitting: Emergency Medicine

## 2014-06-19 VITALS — BP 136/88 | HR 80 | Temp 98.2°F | Resp 18 | Ht 64.0 in | Wt 244.0 lb

## 2014-06-19 DIAGNOSIS — E782 Mixed hyperlipidemia: Secondary | ICD-10-CM

## 2014-06-19 DIAGNOSIS — Z0001 Encounter for general adult medical examination with abnormal findings: Secondary | ICD-10-CM

## 2014-06-19 DIAGNOSIS — Z79899 Other long term (current) drug therapy: Secondary | ICD-10-CM

## 2014-06-19 DIAGNOSIS — E559 Vitamin D deficiency, unspecified: Secondary | ICD-10-CM

## 2014-06-19 DIAGNOSIS — R5381 Other malaise: Secondary | ICD-10-CM

## 2014-06-19 DIAGNOSIS — K760 Fatty (change of) liver, not elsewhere classified: Secondary | ICD-10-CM

## 2014-06-19 DIAGNOSIS — E119 Type 2 diabetes mellitus without complications: Secondary | ICD-10-CM

## 2014-06-19 DIAGNOSIS — Z0000 Encounter for general adult medical examination without abnormal findings: Secondary | ICD-10-CM

## 2014-06-19 DIAGNOSIS — I10 Essential (primary) hypertension: Secondary | ICD-10-CM

## 2014-06-19 DIAGNOSIS — R239 Unspecified skin changes: Secondary | ICD-10-CM

## 2014-06-19 DIAGNOSIS — R6889 Other general symptoms and signs: Secondary | ICD-10-CM

## 2014-06-19 DIAGNOSIS — R1084 Generalized abdominal pain: Secondary | ICD-10-CM

## 2014-06-19 DIAGNOSIS — N390 Urinary tract infection, site not specified: Secondary | ICD-10-CM

## 2014-06-19 DIAGNOSIS — R5383 Other fatigue: Secondary | ICD-10-CM

## 2014-06-19 DIAGNOSIS — Z1212 Encounter for screening for malignant neoplasm of rectum: Secondary | ICD-10-CM

## 2014-06-19 LAB — CBC WITH DIFFERENTIAL/PLATELET
BASOS PCT: 1 % (ref 0–1)
Basophils Absolute: 0.1 10*3/uL (ref 0.0–0.1)
Eosinophils Absolute: 0.2 10*3/uL (ref 0.0–0.7)
Eosinophils Relative: 2 % (ref 0–5)
HCT: 44.9 % (ref 36.0–46.0)
HEMOGLOBIN: 15.4 g/dL — AB (ref 12.0–15.0)
Lymphocytes Relative: 49 % — ABNORMAL HIGH (ref 12–46)
Lymphs Abs: 4.3 10*3/uL — ABNORMAL HIGH (ref 0.7–4.0)
MCH: 29.2 pg (ref 26.0–34.0)
MCHC: 34.3 g/dL (ref 30.0–36.0)
MCV: 85 fL (ref 78.0–100.0)
MPV: 9.4 fL (ref 8.6–12.4)
Monocytes Absolute: 0.4 10*3/uL (ref 0.1–1.0)
Monocytes Relative: 5 % (ref 3–12)
NEUTROS PCT: 43 % (ref 43–77)
Neutro Abs: 3.7 10*3/uL (ref 1.7–7.7)
Platelets: 342 10*3/uL (ref 150–400)
RBC: 5.28 MIL/uL — ABNORMAL HIGH (ref 3.87–5.11)
RDW: 14.1 % (ref 11.5–15.5)
WBC: 8.7 10*3/uL (ref 4.0–10.5)

## 2014-06-19 LAB — MAGNESIUM: MAGNESIUM: 1.9 mg/dL (ref 1.5–2.5)

## 2014-06-19 LAB — HEPATIC FUNCTION PANEL
ALT: 69 U/L — ABNORMAL HIGH (ref 0–35)
AST: 41 U/L — ABNORMAL HIGH (ref 0–37)
Albumin: 4.7 g/dL (ref 3.5–5.2)
Alkaline Phosphatase: 52 U/L (ref 39–117)
BILIRUBIN DIRECT: 0.1 mg/dL (ref 0.0–0.3)
Indirect Bilirubin: 0.5 mg/dL (ref 0.2–1.2)
Total Bilirubin: 0.6 mg/dL (ref 0.2–1.2)
Total Protein: 7.2 g/dL (ref 6.0–8.3)

## 2014-06-19 LAB — BASIC METABOLIC PANEL WITH GFR
BUN: 17 mg/dL (ref 6–23)
CALCIUM: 9.8 mg/dL (ref 8.4–10.5)
CO2: 26 meq/L (ref 19–32)
Chloride: 99 mEq/L (ref 96–112)
Creat: 0.84 mg/dL (ref 0.50–1.10)
GFR, Est African American: >89 mL/min
GFR, Est Non African American: 82 mL/min
GLUCOSE: 132 mg/dL — AB (ref 70–99)
POTASSIUM: 4.1 meq/L (ref 3.5–5.3)
Sodium: 138 mEq/L (ref 135–145)

## 2014-06-19 LAB — LIPID PANEL
Cholesterol: 186 mg/dL (ref 0–200)
HDL: 33 mg/dL — AB (ref >=46)
LDL CALC: 87 mg/dL (ref 0–99)
Total CHOL/HDL Ratio: 5.6 Ratio
Triglycerides: 328 mg/dL — ABNORMAL HIGH (ref <150)
VLDL: 66 mg/dL — AB (ref 0–40)

## 2014-06-19 NOTE — Progress Notes (Signed)
Subjective:    Patient ID: Jenny Singleton, female    DOB: 1965/10/07, 49 y.o.   MRN: [id number]  HPI Comments: 49 yo Obese pleasant WF CPE and presents for 3 month F/U for HTN, Cholesterol, DM, D. Deficient. She thinks Wilder Glade has not been working as well. She has not been eating as healthy. BS 80- 150. SHe checks feet routinely and denies skin break down or neuropathy increase. She is exercising routinely. BP 120s/ 80s. She sitaking RX AD. She denies CV Complaints.   She continues to have abdominal pain on/ off over last year. She denies triggers and notes pain resolves without treatment. She notes bloating has also increased. She has had multiple scans that are NEG except fatty liver. She has seen GI with out official diagnosis.   Lab Results      Component                Value               Date                      WBC                      8.4                 10/30/2013                HGB                      15.1*               10/30/2013                HCT                      44.7                10/30/2013                PLT                      266                 10/30/2013                GLUCOSE                  176*                10/30/2013                CHOL                     187                 10/22/2013                TRIG                     320*                10/22/2013                HDL                      41  10/22/2013                LDLCALC                  82                  10/22/2013                ALT                      73*                 10/30/2013                AST                      52*                 10/30/2013                NA                       139                 10/30/2013                K                        3.8                 10/30/2013                CL                       98                  10/30/2013                CREATININE               0.80                10/30/2013                BUN                      14                   10/30/2013                CO2                      26                  10/30/2013                TSH                      1.854               10/22/2013                HGBA1C                   8.2*                10/22/2013  MICROALBUR               0.50                05/23/2013               Medication List       This list is accurate as of: 06/19/14  4:45 PM.  Always use your most recent med list.               ALPRAZolam 0.5 MG tablet  Commonly known as:  XANAX  Take 1 tablet (0.5 mg total) by mouth 3 (three) times daily as needed for sleep or anxiety.     COQ10 PO  Take by mouth daily.     dapagliflozin propanediol 10 MG Tabs tablet  Commonly known as:  FARXIGA  Take 10 mg by mouth daily.     glucose blood test strip  Use as instructed     glucose monitoring kit monitoring kit  1 each by Does not apply route as needed for other. Use as directed     lisinopril 20 MG tablet  Commonly known as:  PRINIVIL,ZESTRIL  TAKE 2 TABLETS (40 MG TOTAL) BY MOUTH DAILY.     lisinopril-hydrochlorothiazide 20-12.5 MG per tablet  Commonly known as:  PRINZIDE,ZESTORETIC  TAKE 1 TABLET ONCE DAILY.     Magnesium 500 MG Caps  Take 1 capsule by mouth daily. Taking 924m daily     metFORMIN 500 MG 24 hr tablet  Commonly known as:  GLUCOPHAGE-XR  TAKE (2) TABLETS TWICE DAILY.     milk thistle 175 MG tablet  Take 175 mg by mouth 2 (two) times daily.     omeprazole 20 MG capsule  Commonly known as:  PRILOSEC  TAKE 1 TABLET ONCE DAILY.     pravastatin 40 MG tablet  Commonly known as:  PRAVACHOL  TAKE 1 TABLET DAILY.     sucralfate 1 GM/10ML suspension  Commonly known as:  CARAFATE  Take 10 mLs (1 g total) by mouth 3 (three) times daily with meals.     Vitamin D-3 5000 UNITS Tabs  Take by mouth daily.       Allergies  Allergen Reactions  . Welchol [Colesevelam Hcl]     HIves   Past Medical History  Diagnosis Date  . Hypertension   .  OSA on CPAP   . Vitamin D deficiency   . Iron deficiency   . Exercise-induced asthma   . Diabetes mellitus   . Hyperlipidemia   . Kidney stones   . Hernia   . Obesity   . Colon polyps 07/11/2012    TUBULOVILLOUS ADENOMA  . Colitis 07/11/2012   Past Surgical History  Procedure Laterality Date  . Tonsilectomy, adenoidectomy, bilateral myringotomy and tubes    . Ablation    . Abdominoplasty    . Esophagogastroduodenoscopy  07/11/2012    Normal   . Colonoscopy w/ biopsies  07/11/2012   History  Substance Use Topics  . Smoking status: Former Smoker -- 1.00 packs/day for 2 years    Quit date: 03/21/1985  . Smokeless tobacco: Never Used     Comment: smoked age [age] . Alcohol Use: No     MAINTENANCE: Colonoscopy:2014 due 2017 polyp Mammo:05/2013 WNL @ GYN BMD: Pap/ Pelvic:08/08/13 @ GYN EYE:04/21/2014 WNL Dentist:q 6 month  IMMUNIZATIONS: Td:2008 Pneumovax:n/a Zostavax:n/a Influenza: 12/2013 @ work  Patient Care Team: WUnk Pinto MD as PCP - General (Internal Medicine) MLadene Artist  MD as Consulting Physician (Gastroenterology) Remer Macho, MD as Attending Physician (Ophthalmology) Lenore Cordia, MD (Obstetrics and Gynecology) Frederik Pear, MD as Consulting Physician (Orthopedic Surgery) Rolan Bucco, MD as Consulting Physician (Urology) Sherlyn Lick, MD (Dermatology) Judeth Horn, (Dentist)  Review of Systems  Constitutional: Positive for fatigue.  Respiratory: Negative for shortness of breath.   Cardiovascular: Negative for chest pain.  Gastrointestinal: Positive for abdominal pain and abdominal distention.  Psychiatric/Behavioral: Negative for suicidal ideas and sleep disturbance.  All other systems reviewed and are negative.  BP 136/88 mmHg  Pulse 80  Temp(Src) 98.2 F (36.8 C) (Temporal)  Resp 18  Ht _0  (1.626 m)  Wt 244 lb (110.678 kg)  BMI 41.86 kg/m2     Objective:   Physical Exam  Constitutional: She is oriented to person,  place, and time. She appears well-developed and well-nourished. No distress.  obese  HENT:  Head: Normocephalic.  Nose: Nose normal.  Mouth/Throat: Oropharynx is clear and moist.  Eyes: Conjunctivae and EOM are normal. Pupils are equal, round, and reactive to light. No scleral icterus.  Neck: Normal range of motion. Neck supple. No JVD present. No tracheal deviation present. No thyromegaly present.  Cardiovascular: Normal rate, regular rhythm, normal heart sounds and intact distal pulses.   Pulmonary/Chest: Effort normal and breath sounds normal.  Abdominal: Soft. Bowel sounds are normal. She exhibits no distension and no mass. There is no tenderness.  Genitourinary:  Def gyn  Musculoskeletal: Normal range of motion. She exhibits no edema or tenderness.  Lymphadenopathy:    She has no cervical adenopathy.  Neurological: She is alert and oriented to person, place, and time. She has normal reflexes. No cranial nerve deficit. She exhibits normal muscle tone. Coordination normal.  Skin: Skin is warm and dry. No rash noted. No erythema.     Psychiatric: She has a normal mood and affect. Her behavior is normal. Judgment and thought content normal.  Nursing note and vitals reviewed.      EKG NSCSPT WNL  Assessment & Plan:  1. CPE- Update screening labs/ History/ Immunizations/ Testing as needed. Advised healthy diet, QD exercise, increase H20 and continue RX/ Vitamins AD.   2.. 3 month F/U for HTN, Cholesterol,DM, D. Deficient. Needs healthy diet, cardio QD and obtain healthy weight. Check Labs, Check BP if >130/80 call office, Check BS if >200 call office,  Stop Wilder Glade and try SX INvokana 300 mg # 30 and recheck 1 monthOV BMP. If no improvement consider Trulicity  3. Fatigue- check labs, increase activity and H2O   4.  Irreg Nevi- monitor for any change, schedule for removal at 1 month OV  5. Abdomen pain with bloating with hx fatty liver- Need weight loss, improve diet/ cholesterol  control. Add probiotic if no change needs further evaluation by GI

## 2014-06-19 NOTE — Patient Instructions (Signed)
Fatty Liver  Fatty liver is the accumulation of fat in liver cells. It is also called hepatosteatosis or steatohepatitis. It is normal for your liver to contain some fat. If fat is more than 5 to 10% of your liver's weight, you have fatty liver.   There are often no symptoms (problems) for years while damage is still occurring. People often learn about their fatty liver when they have medical tests for other reasons. Fat can damage your liver for years or even decades without causing problems. When it becomes severe, it can cause fatigue, weight loss, weakness, and confusion.  This makes you more likely to develop more serious liver problems. The liver is the largest organ in the body. It does a lot of work and often gives no warning signs when it is sick until late in a disease.  The liver has many important jobs including:  · Breaking down foods.  · Storing vitamins, iron, and other minerals.  · Making proteins.  · Making bile for food digestion.  · Breaking down many products including medications, alcohol and some poisons.  CAUSES   There are a number of different conditions, medications, and poisons that can cause a fatty liver. Eating too many calories causes fat to build up in the liver. Not processing and breaking fats down normally may also cause this. Certain conditions, such as obesity, diabetes, and high triglycerides also cause this. Most fatty liver patients tend to be middle-aged and over weight.   Some causes of fatty liver are:  · Alcohol over consumption.  · Malnutrition.  · Steroid use.  · Valproic acid toxicity.  · Obesity.  · Cushing's syndrome.  · Poisons.  · Tetracycline in high dosages.  · Pregnancy.  · Diabetes.  · Hyperlipidemia.  · Rapid weight loss.  Some people develop fatty liver even having none of these conditions.  SYMPTOMS   Fatty liver most often causes no problems. This is called asymptomatic.  · It can be diagnosed with blood tests and also by a liver biopsy.  · It is one of the  most common causes of minor elevations of liver enzymes on routine blood tests.  · Specialized Imaging of the liver using ultrasound, CT (computed tomography) scan, or MRI (magnetic resonance imaging) can suggest a fatty liver but a biopsy is needed to confirm it.  · A biopsy involves taking a small sample of liver tissue. This is done by using a needle. It is then looked at under a microscope by a specialist.  TREATMENT   It is important to treat the cause. Simple fatty liver without a medical reason may not need treatment.  · Weight loss, fat restriction, and exercise in overweight patients produces inconsistent results but is worth trying.  · Fatty liver due to alcohol toxicity may not improve even with stopping drinking.  · Good control of diabetes may reduce fatty liver.  · Lower your triglycerides through diet, medication or both.  · Eat a balanced, healthy diet.  · Increase your physical activity.  · Get regular checkups from a liver specialist.  · There are no medical or surgical treatments for a fatty liver or NASH, but improving your diet and increasing your exercise may help prevent or reverse some of the damage.  PROGNOSIS   Fatty liver may cause no damage or it can lead to an inflammation of the liver. This is, called steatohepatitis. When it is linked to alcohol abuse, it is called alcoholic steatohepatitis. It often   is not linked to alcohol. It is then called nonalcoholic steatohepatitis, or NASH. Over time the liver may become scarred and hardened. This condition is called cirrhosis. Cirrhosis is serious and may lead to liver failure or cancer. NASH is one of the leading causes of cirrhosis. About 10-20% of Americans have fatty liver and a smaller 2-5% has NASH.  Document Released: 04/22/2005 Document Revised: 05/30/2011 Document Reviewed: 07/17/2013  [employer]® Patient Information ©2015 [employer]. This information is not intended to replace advice given to you by your health care provider. Make  sure you discuss any questions you have with your health care provider.

## 2014-06-20 ENCOUNTER — Encounter: Payer: Self-pay | Admitting: Emergency Medicine

## 2014-06-20 LAB — URINALYSIS, ROUTINE W REFLEX MICROSCOPIC
Bilirubin Urine: NEGATIVE
Glucose, UA: >1000 mg/dL — AB
Hgb urine dipstick: NEGATIVE
Ketones, ur: NEGATIVE mg/dL
Leukocytes, UA: NEGATIVE
Nitrite: NEGATIVE
Protein, ur: NEGATIVE mg/dL
Specific Gravity, Urine: >1.03 — ABNORMAL HIGH (ref 1.005–1.030)
Urobilinogen, UA: 0.2 mg/dL (ref 0.0–1.0)
pH: 5 (ref 5.0–8.0)

## 2014-06-20 LAB — TSH: TSH: 2.522 u[IU]/mL (ref 0.350–4.500)

## 2014-06-20 LAB — MICROALBUMIN / CREATININE URINE RATIO
CREATININE, URINE: 189.3 mg/dL
MICROALB UR: 5.1 mg/dL — AB (ref <2.0)
MICROALB/CREAT RATIO: 26.9 mg/g (ref 0.0–30.0)

## 2014-06-20 LAB — VITAMIN D 25 HYDROXY (VIT D DEFICIENCY, FRACTURES): Vit D, 25-Hydroxy: 41 ng/mL (ref 30–100)

## 2014-06-20 LAB — URINALYSIS, MICROSCOPIC ONLY: Casts: NONE SEEN

## 2014-06-20 LAB — HEMOGLOBIN A1C
Hgb A1c MFr Bld: 7.9 % — ABNORMAL HIGH (ref <5.7)
Mean Plasma Glucose: 180 mg/dL — ABNORMAL HIGH (ref <117)

## 2014-06-20 LAB — INSULIN, FASTING: INSULIN FASTING, SERUM: 55 u[IU]/mL — AB (ref 2.0–19.6)

## 2014-06-21 ENCOUNTER — Other Ambulatory Visit: Payer: Self-pay | Admitting: Physician Assistant

## 2014-06-21 DIAGNOSIS — K219 Gastro-esophageal reflux disease without esophagitis: Secondary | ICD-10-CM

## 2014-06-21 DIAGNOSIS — E782 Mixed hyperlipidemia: Secondary | ICD-10-CM

## 2014-06-21 DIAGNOSIS — E119 Type 2 diabetes mellitus without complications: Secondary | ICD-10-CM

## 2014-06-21 LAB — URINE CULTURE: Colony Count: 30000

## 2014-06-23 ENCOUNTER — Encounter: Payer: Self-pay | Admitting: Physician Assistant

## 2014-06-23 ENCOUNTER — Other Ambulatory Visit: Payer: Self-pay | Admitting: Physician Assistant

## 2014-06-23 MED ORDER — DAPAGLIFLOZIN PROPANEDIOL 10 MG PO TABS: 10.0000 mg | ORAL_TABLET | Freq: Every day | ORAL | Status: DC

## 2014-06-23 MED ORDER — SAXAGLIPTIN-METFORMIN ER 5-1000 MG PO TB24: ORAL_TABLET | ORAL | Status: DC

## 2014-06-23 MED ORDER — CANAGLIFLOZIN 300 MG PO TABS: 300.0000 mg | ORAL_TABLET | Freq: Every day | ORAL | Status: DC

## 2014-07-08 ENCOUNTER — Other Ambulatory Visit: Payer: Self-pay | Admitting: Internal Medicine

## 2014-07-08 ENCOUNTER — Encounter: Payer: Self-pay | Admitting: *Deleted

## 2014-07-24 ENCOUNTER — Encounter: Payer: Self-pay | Admitting: Internal Medicine

## 2014-07-24 ENCOUNTER — Ambulatory Visit (INDEPENDENT_AMBULATORY_CARE_PROVIDER_SITE_OTHER): Payer: Commercial Indemnity | Admitting: Internal Medicine

## 2014-07-24 VITALS — BP 132/86 | HR 80 | Temp 97.8°F | Resp 18 | Ht 64.0 in | Wt 240.0 lb

## 2014-07-24 DIAGNOSIS — R809 Proteinuria, unspecified: Secondary | ICD-10-CM

## 2014-07-24 DIAGNOSIS — L819 Disorder of pigmentation, unspecified: Secondary | ICD-10-CM

## 2014-07-24 LAB — BASIC METABOLIC PANEL WITH GFR
BUN: 19 mg/dL (ref 6–23)
CO2: 22 mEq/L (ref 19–32)
Calcium: 10.2 mg/dL (ref 8.4–10.5)
Chloride: 98 mEq/L (ref 96–112)
Creat: 0.94 mg/dL (ref 0.50–1.10)
GFR, EST NON AFRICAN AMERICAN: 72 mL/min
GFR, Est African American: 83 mL/min
Glucose, Bld: 142 mg/dL — ABNORMAL HIGH (ref 70–99)
Potassium: 4.6 mEq/L (ref 3.5–5.3)
SODIUM: 137 meq/L (ref 135–145)

## 2014-07-24 NOTE — Progress Notes (Signed)
   Subjective:    Patient ID: Jenny Singleton, female    DOB: 06-Jun-1965, 49 y.o.   MRN: [id number]  HPI  Patient presents to the office for evaluation of moles and also recheck of malbumin and BMP.  She has had moles on her back for some time.  She was told by her provider that the moles were darker and she was supposed to get them removed.  She did have a meeting scheduled last minute and also goes to the beach next week.  She reports that she has been taking invokana for 30 days.  She reports that she has been doing okay on it.     Review of Systems  Constitutional: Negative for fever, chills and fatigue.  Respiratory: Negative for chest tightness and shortness of breath.   Cardiovascular: Negative for chest pain and palpitations.  Gastrointestinal: Negative for nausea, vomiting, abdominal pain, diarrhea and constipation.  Genitourinary: Negative for dysuria, urgency, frequency, hematuria and difficulty urinating.  Neurological: Negative for dizziness and light-headedness.       Objective:   Physical Exam  Constitutional: She is oriented to person, place, and time. She appears well-developed and well-nourished. No distress.  HENT:  Head: Normocephalic and atraumatic.  Mouth/Throat: Oropharynx is clear and moist. No oropharyngeal exudate.  Eyes: Conjunctivae and EOM are normal. Pupils are equal, round, and reactive to light. No scleral icterus.  Neck: Normal range of motion. Neck supple. No JVD present. No thyromegaly present.  Cardiovascular: Normal rate, regular rhythm, normal heart sounds and intact distal pulses.  Exam reveals no gallop and no friction rub.   No murmur heard. Pulmonary/Chest: Effort normal and breath sounds normal. No respiratory distress. She has no wheezes. She has no rales. She exhibits no tenderness.  Musculoskeletal: Normal range of motion.  Lymphadenopathy:    She has no cervical adenopathy.  Neurological: She is alert and oriented to person, place, and  time.  Skin: Skin is warm and dry. She is not diaphoretic.     Psychiatric: She has a normal mood and affect. Her behavior is normal. Judgment and thought content normal.  Nursing note and vitals reviewed.         Assessment & Plan:    1. Proteinuria -elevated at prior visit will recheck today - Microalbumin / creatinine urine ratio - BASIC METABOLIC PANEL WITH GFR  2. Pigmented skin lesion of uncertain nature -reschedule at another visit in approximately 1 month

## 2014-07-25 LAB — MICROALBUMIN / CREATININE URINE RATIO
Creatinine, Urine: 107.7 mg/dL
MICROALB UR: 1.1 mg/dL (ref <2.0)
MICROALB/CREAT RATIO: 10.2 mg/g (ref 0.0–30.0)

## 2014-07-29 ENCOUNTER — Other Ambulatory Visit: Payer: Self-pay | Admitting: Emergency Medicine

## 2014-07-29 ENCOUNTER — Other Ambulatory Visit: Payer: Self-pay

## 2014-07-29 MED ORDER — CANAGLIFLOZIN 300 MG PO TABS: 300.0000 mg | ORAL_TABLET | Freq: Every day | ORAL | Status: DC

## 2014-08-13 ENCOUNTER — Encounter: Payer: Self-pay | Admitting: Physician Assistant

## 2014-08-13 ENCOUNTER — Other Ambulatory Visit: Payer: Self-pay | Admitting: Physician Assistant

## 2014-08-13 MED ORDER — PREDNISONE 20 MG PO TABS: ORAL_TABLET | ORAL | Status: AC

## 2014-08-25 ENCOUNTER — Other Ambulatory Visit: Payer: Self-pay | Admitting: Internal Medicine

## 2014-09-03 ENCOUNTER — Encounter: Payer: Self-pay | Admitting: Emergency Medicine

## 2014-09-06 ENCOUNTER — Other Ambulatory Visit: Payer: Self-pay | Admitting: Internal Medicine

## 2014-09-06 DIAGNOSIS — I10 Essential (primary) hypertension: Secondary | ICD-10-CM

## 2014-09-09 ENCOUNTER — Other Ambulatory Visit: Payer: Self-pay | Admitting: Internal Medicine

## 2014-09-20 ENCOUNTER — Other Ambulatory Visit: Payer: Self-pay | Admitting: Internal Medicine

## 2014-09-20 DIAGNOSIS — E782 Mixed hyperlipidemia: Secondary | ICD-10-CM

## 2014-09-20 DIAGNOSIS — E119 Type 2 diabetes mellitus without complications: Secondary | ICD-10-CM

## 2014-09-25 ENCOUNTER — Ambulatory Visit (INDEPENDENT_AMBULATORY_CARE_PROVIDER_SITE_OTHER): Payer: Commercial Indemnity | Admitting: Internal Medicine

## 2014-09-25 ENCOUNTER — Encounter: Payer: Self-pay | Admitting: Internal Medicine

## 2014-09-25 VITALS — BP 132/82 | HR 76 | Temp 97.2°F | Resp 16 | Ht 64.5 in | Wt 240.4 lb

## 2014-09-25 DIAGNOSIS — H00016 Hordeolum externum left eye, unspecified eyelid: Secondary | ICD-10-CM

## 2014-09-25 DIAGNOSIS — E1129 Type 2 diabetes mellitus with other diabetic kidney complication: Secondary | ICD-10-CM | POA: Insufficient documentation

## 2014-09-25 DIAGNOSIS — K429 Umbilical hernia without obstruction or gangrene: Secondary | ICD-10-CM

## 2014-09-25 MED ORDER — NEOMYCIN-POLYMYXIN-DEXAMETH 0.1 % OP SUSP: OPHTHALMIC | Status: AC

## 2014-09-25 NOTE — Progress Notes (Signed)
Subjective:    Patient ID: Jenny Singleton, female    DOB: 10/10/1965, 49 y.o.   MRN: [id number]  HPI This is a very nice 49 yo MWF with HTN (1990) , HLD, T2-DM (Apr 2012)  and Morbid Obesity (BMI 61) presenting with c/o stye of the left upper eyelid and concern of a midline peri-umbilical hernia. She relates BP's have been Nl and CV Ros is negative. She reports FBG's range in the 90-120's and denies hypoglycemia, diabetic polys or paresthesias. She re[ports finding a peri-umbilical protrusion with abdominal straining and contacted Dr Towanda Malkin who did an Abdominoplasty on her in the past and is scheduling to see him for evaluation anticipating laparoscopic repair.   Medication Sig  . canagliflozin (INVOKANA) 300 MG TABS tablet Take 300 mg by mouth daily before breakfast.  . Cholecalciferol (VITAMIN D-3) 5000 UNITS TABS Take by mouth daily.  . Coenzyme Q10 (COQ10 PO) Take by mouth daily.  Marland Kitchen glucose blood test strip Use as instructed  . glucose monitoring kit (FREESTYLE) monitoring kit 1 each by Does not apply route as needed for other. Use as directed  . lisinopril (PRINIVIL,ZESTRIL) 20 MG tablet TAKE 2 TABLETS (40 MG TOTAL) BY MOUTH DAILY.  Marland Kitchen lisinopril-hydrochlorothiazide (PRINZIDE,ZESTORETIC) 20-12.5 MG per tablet TAKE 1 TABLET ONCE DAILY.  . Magnesium 500 MG CAPS Take 1 capsule by mouth daily. Taking 980m daily  . metFORMIN (GLUCOPHAGE-XR) 500 MG 24 hr tablet TAKE (2) TABLETS TWICE DAILY.  . milk thistle 175 MG tablet Take 175 mg by mouth 2 (two) times daily.    .Marland Kitchenomeprazole (PRILOSEC) 20 MG capsule TAKE 1 CAPSULE EVERY DAY.  . pravastatin (PRAVACHOL) 40 MG tablet TAKE 1 TABLET EACH DAY.   Allergies  Allergen Reactions  . Welchol [Colesevelam Hcl]     HIves   Past Medical History  Diagnosis Date  . Hypertension   . OSA on CPAP   . Vitamin D deficiency   . Iron deficiency   . Exercise-induced asthma   . Diabetes mellitus   . Hyperlipidemia   . Kidney stones   . Hernia   .  Obesity   . Colon polyps 07/11/2012    TUBULOVILLOUS ADENOMA  . Colitis 07/11/2012   Past Surgical History  Procedure Laterality Date  . Tonsilectomy, adenoidectomy, bilateral myringotomy and tubes    . Ablation    . Abdominoplasty    . Esophagogastroduodenoscopy  07/11/2012    Normal   . Colonoscopy w/ biopsies  07/11/2012   Review of Systems 10 point systems review negative except as above.    Objective:   Physical Exam  BP 132/82 mmHg  Pulse 76  Temp(Src) 97.2 F (36.2 C)  Resp 16  Ht 5' 4.5" (1.638 m)  Wt 240 lb 6.4 oz (109.045 kg)  BMI 40.64 kg/m2  HEENT - Eac's patent. TM's Nl. EOM's full. PERRLA. NasoOroPharynx clear. Sl tender erythematous swelling of the left upper eyelid. No purulence.  Neck - supple. Nl Thyroid. Carotids 2+ & No bruits, nodes, JVD Chest - Clear equal BS w/o Rales, rhonchi, wheezes. Cor - Nl HS. RRR w/o sig MGR. PP 1(+). 1(+) pretibial  edema. Abd - No palpable organomegaly, masses or tenderness. Palpable soft peri-umbilical hernia. BS nl. MS- FROM w/o deformities. Muscle power, tone and bulk Nl. Gait Nl. Neuro - No obvious Cr N abnormalities. Sensory, motor and Cerebellar functions appear Nl w/o focal abnormalities.  Assessment & Plan:   1. Stye external, left  - neomycin-polymyxin-dexamethasone (MAXITROL) 0.1 % ophthalmic suspension;  Instill 1 to 2 drops to the affected eye 4 x daily  Dispense: 5 mL; Refill: 0  2. Umbilical hernia without obstruction and without gangrene  - f/u with Dr Towanda Malkin as planned

## 2014-09-25 NOTE — Patient Instructions (Signed)

## 2014-10-21 ENCOUNTER — Encounter: Payer: Self-pay | Admitting: Emergency Medicine

## 2014-10-30 ENCOUNTER — Ambulatory Visit (INDEPENDENT_AMBULATORY_CARE_PROVIDER_SITE_OTHER): Payer: Commercial Indemnity | Admitting: Internal Medicine

## 2014-10-30 ENCOUNTER — Encounter: Payer: Self-pay | Admitting: Internal Medicine

## 2014-10-30 VITALS — BP 130/88 | HR 90 | Temp 98.2°F | Resp 18 | Ht 64.5 in | Wt 241.0 lb

## 2014-10-30 DIAGNOSIS — E1129 Type 2 diabetes mellitus with other diabetic kidney complication: Secondary | ICD-10-CM | POA: Diagnosis not present

## 2014-10-30 DIAGNOSIS — I10 Essential (primary) hypertension: Secondary | ICD-10-CM | POA: Diagnosis not present

## 2014-10-30 DIAGNOSIS — K760 Fatty (change of) liver, not elsewhere classified: Secondary | ICD-10-CM | POA: Diagnosis not present

## 2014-10-30 DIAGNOSIS — Z79899 Other long term (current) drug therapy: Secondary | ICD-10-CM

## 2014-10-30 DIAGNOSIS — E559 Vitamin D deficiency, unspecified: Secondary | ICD-10-CM

## 2014-10-30 LAB — CBC WITH DIFFERENTIAL/PLATELET
BASOS PCT: 0 % (ref 0–1)
Basophils Absolute: 0 10*3/uL (ref 0.0–0.1)
EOS ABS: 0.1 10*3/uL (ref 0.0–0.7)
EOS PCT: 2 % (ref 0–5)
HEMATOCRIT: 42.9 % (ref 36.0–46.0)
Hemoglobin: 14.8 g/dL (ref 12.0–15.0)
Lymphocytes Relative: 42 % (ref 12–46)
Lymphs Abs: 3 10*3/uL (ref 0.7–4.0)
MCH: 28.6 pg (ref 26.0–34.0)
MCHC: 34.5 g/dL (ref 30.0–36.0)
MCV: 83 fL (ref 78.0–100.0)
MONO ABS: 0.4 10*3/uL (ref 0.1–1.0)
MONOS PCT: 5 % (ref 3–12)
MPV: 9.1 fL (ref 8.6–12.4)
Neutro Abs: 3.6 10*3/uL (ref 1.7–7.7)
Neutrophils Relative %: 51 % (ref 43–77)
Platelets: 277 10*3/uL (ref 150–400)
RBC: 5.17 MIL/uL — ABNORMAL HIGH (ref 3.87–5.11)
RDW: 14 % (ref 11.5–15.5)
WBC: 7.1 10*3/uL (ref 4.0–10.5)

## 2014-10-30 LAB — BASIC METABOLIC PANEL WITH GFR
BUN: 19 mg/dL (ref 7–25)
CO2: 23 mmol/L (ref 20–31)
Calcium: 9.6 mg/dL (ref 8.6–10.2)
Chloride: 104 mmol/L (ref 98–110)
Creat: 0.91 mg/dL (ref 0.50–1.10)
GFR, Est African American: 86 mL/min (ref >=60)
GFR, Est Non African American: 74 mL/min (ref >=60)
Glucose, Bld: 129 mg/dL — ABNORMAL HIGH (ref 65–99)
Potassium: 4.3 mmol/L (ref 3.5–5.3)
SODIUM: 141 mmol/L (ref 135–146)

## 2014-10-30 LAB — HEPATIC FUNCTION PANEL
ALBUMIN: 4.2 g/dL (ref 3.6–5.1)
ALK PHOS: 52 U/L (ref 33–115)
ALT: 73 U/L — ABNORMAL HIGH (ref 6–29)
AST: 54 U/L — ABNORMAL HIGH (ref 10–35)
Bilirubin, Direct: 0.1 mg/dL (ref <=0.2)
Indirect Bilirubin: 0.4 mg/dL (ref 0.2–1.2)
TOTAL PROTEIN: 6.9 g/dL (ref 6.1–8.1)
Total Bilirubin: 0.5 mg/dL (ref 0.2–1.2)

## 2014-10-30 LAB — TSH: TSH: 1.764 u[IU]/mL (ref 0.350–4.500)

## 2014-10-30 LAB — VITAMIN D 25 HYDROXY (VIT D DEFICIENCY, FRACTURES): Vit D, 25-Hydroxy: 35 ng/mL (ref 30–100)

## 2014-10-30 LAB — LIPID PANEL
CHOL/HDL RATIO: 4 ratio (ref <=5.0)
Cholesterol: 168 mg/dL (ref 125–200)
HDL: 42 mg/dL — ABNORMAL LOW (ref >=46)
LDL Cholesterol: 84 mg/dL (ref <130)
TRIGLYCERIDES: 209 mg/dL — AB (ref <150)
VLDL: 42 mg/dL — ABNORMAL HIGH (ref <30)

## 2014-10-30 LAB — MAGNESIUM: MAGNESIUM: 2 mg/dL (ref 1.5–2.5)

## 2014-10-30 MED ORDER — OMEPRAZOLE 20 MG PO CPDR: DELAYED_RELEASE_CAPSULE | ORAL | Status: DC

## 2014-10-30 MED ORDER — CANAGLIFLOZIN 300 MG PO TABS: 300.0000 mg | ORAL_TABLET | Freq: Every day | ORAL | Status: DC

## 2014-10-30 NOTE — Progress Notes (Signed)
Patient ID: Jenny Singleton, female   DOB: 12-06-1965, 49 y.o.   MRN: [id number]  Assessment and Plan:  Hypertension:  -Continue medication -monitor blood pressure at home. -Continue DASH diet -Reminder to go to the ER if any CP, SOB, nausea, dizziness, severe HA, changes vision/speech, left arm numbness and tingling and jaw pain.  Cholesterol - Continue diet and exercise -Check cholesterol.   Diabetes with diabetic chronic kidney disease -Continue diet and exercise.  -Check A1C  Vitamin D Def -check level -continue medications.   Hernia repair -must get down to 230 for this surgery -if can lose weight in a month can consider adding in topamax  Continue diet and meds as discussed. Further disposition pending results of labs. Discussed med's effects and SE's.    HPI 49 y.o. female  presents for 3 month follow up with hypertension, hyperlipidemia, diabetes and vitamin D deficiency.   Her blood pressure has been controlled at home, today their BP is BP: 130/88 mmHg.She does workout. She denies chest pain, shortness of breath, dizziness.  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     She is on cholesterol medication and denies myalgias. Her cholesterol is at goal. The cholesterol was:  06/19/2014: Cholesterol 186; HDL 33*; LDL Cholesterol 87; Triglycerides 328*   She has been working on diet and exercise for diabetes with diabetic chronic kidney disease, she is on bASA, she is on ACE/ARB, and denies  foot ulcerations, hyperglycemia, hypoglycemia , increased appetite, nausea, paresthesia of the feet, polydipsia, polyuria, visual disturbances, vomiting and weight loss. Last A1C was: 06/19/2014: Hgb A1c MFr Bld 7.9*.  She reports that her blood sugars have been running okay.  She reports that blood sugar in the mornings are 90-110.     Patient is on Vitamin D supplement. 06/19/2014: Vit D, 25-Hydroxy 41  She  reports that she is due to have hernia repair which is happening in November.      Current Medications:  Current Outpatient Prescriptions on File Prior to Visit  Medication Sig Dispense Refill  . canagliflozin (INVOKANA) 300 MG TABS tablet Take 300 mg by mouth daily before breakfast. 30 tablet 3  . Cholecalciferol (VITAMIN D-3) 5000 UNITS TABS Take by mouth daily.    . Coenzyme Q10 (COQ10 PO) Take by mouth daily.    Marland Kitchen glucose blood test strip Use as instructed 100 each 12  . glucose monitoring kit (FREESTYLE) monitoring kit 1 each by Does not apply route as needed for other. Use as directed 1 each 0  . lisinopril (PRINIVIL,ZESTRIL) 20 MG tablet TAKE 2 TABLETS (40 MG TOTAL) BY MOUTH DAILY. 60 tablet 3  . lisinopril-hydrochlorothiazide (PRINZIDE,ZESTORETIC) 20-12.5 MG per tablet TAKE 1 TABLET ONCE DAILY. 90 tablet 0  . Magnesium 500 MG CAPS Take 1 capsule by mouth daily. Taking 961m daily    . metFORMIN (GLUCOPHAGE-XR) 500 MG 24 hr tablet TAKE (2) TABLETS TWICE DAILY. 360 tablet 1  . omeprazole (PRILOSEC) 20 MG capsule TAKE 1 CAPSULE EVERY DAY. 30 capsule 3  . pravastatin (PRAVACHOL) 40 MG tablet TAKE 1 TABLET EACH DAY. 90 tablet 1   No current facility-administered medications on file prior to visit.   Medical History:  Past Medical History  Diagnosis Date  . Hypertension   . OSA on CPAP   . Vitamin D deficiency   . Iron deficiency   . Exercise-induced asthma   . Diabetes mellitus   .  Hyperlipidemia   . Kidney stones   . Hernia   . Obesity   . Colon polyps 07/11/2012    TUBULOVILLOUS ADENOMA  . Colitis 07/11/2012   Allergies:  Allergies  Allergen Reactions  . Welchol [Colesevelam Hcl]     HIves     Review of Systems:  Review of Systems  Constitutional: Negative for fever, chills and malaise/fatigue.  HENT: Negative for congestion, ear pain and sore throat.   Eyes: Negative.   Respiratory: Negative for cough, shortness of breath and wheezing.   Cardiovascular: Negative  for chest pain, palpitations and leg swelling.  Gastrointestinal: Negative for heartburn, diarrhea, constipation, blood in stool and melena.  Genitourinary: Negative.   Skin: Negative.   Neurological: Negative for dizziness, sensory change, loss of consciousness and headaches.  Psychiatric/Behavioral: Negative for depression. The patient is not nervous/anxious and does not have insomnia.     Family history- Review and unchanged  Social history- Review and unchanged  Physical Exam: BP 130/88 mmHg  Pulse 90  Temp(Src) 98.2 F (36.8 C) (Temporal)  Resp 18  Ht 5' 4.5" (1.638 m)  Wt 241 lb (109.317 kg)  BMI 40.74 kg/m2 Wt Readings from Last 3 Encounters:  10/30/14 241 lb (109.317 kg)  09/25/14 240 lb 6.4 oz (109.045 kg)  07/24/14 240 lb (108.863 kg)   General Appearance: Well nourished well developed, non-toxic appearing, in no apparent distress. Eyes: PERRLA, EOMs, conjunctiva no swelling or erythema ENT/Mouth: Ear canals clear with no erythema, swelling, or discharge.  TMs normal bilaterally, oropharynx clear, moist, with no exudate.   Neck: Supple, thyroid normal, no JVD, no cervical adenopathy.  Respiratory: Respiratory effort normal, breath sounds clear A&P, no wheeze, rhonchi or rales noted.  No retractions, no accessory muscle usage Cardio: RRR with no MRGs. No noted edema.  Abdomen: Soft, + BS.  Non tender, no guarding, rebound, masses.  Ventral hernia in the right middle abdomen.   Musculoskeletal: Full ROM, 5/5 strength, Normal gait Skin: Warm, dry without rashes, lesions, ecchymosis.  Neuro: Awake and oriented X 3, Cranial nerves intact. No cerebellar symptoms.  Psych: normal affect, Insight and Judgment appropriate.    Starlyn Skeans, PA-C 9:17 AM [facility]

## 2014-10-30 NOTE — Patient Instructions (Addendum)
Hernia Repair Care After These instructions give you information on caring for yourself after your procedure. Your doctor may also give you more specific instructions. Call your doctor if you have any problems or questions after your procedure. HOME CARE   You may have changes in your poops (bowel movements).  You may have loose or watery poop (diarrhea).  You may be not able to poop.  Your bowels will slowly get back to normal.  Do not eat any food that makes you sick to your stomach (nauseous). Eat small meals 4 to 6 times a day instead of 3 large ones.  Do not drink pop. It will give you gas.  Do not drink alcohol.  Do not lift anything heavier than 10 pounds. This is about the weight of a gallon of milk.  Do not do anything that makes you very tired for at least 6 weeks.  Do not get your wound wet for 2 days.  You may take a sponge bath during this time.  After 2 days you may take a shower. Gently pat your surgical cut (incision) dry with a towel. Do not rub it.  For men: You may have been given an athletic supporter (scrotal support) before you left the hospital. It holds your scrotum and testicles closer to your body so there is no strain on your wound. Wear the supporter until your doctor tells you that you do not need it anymore. GET HELP RIGHT AWAY IF:  You have watery poop, or cannot poop for more than 3 days.  You feel sick to your stomach or throw up (vomit) more than 2 or 3 times.  You have temperature by mouth above 102 F (38.9 C).  You see redness or puffiness (swelling) around your wound.  You see yellowish white fluid (pus) coming from your wound.  You see a bulge or bump in your lower belly (abdomen) or near your groin.  You develop a rash, trouble breathing, or any other symptoms from medicines taken. MAKE SURE YOU:  Understand these instructions.  Will watch your condition.  Will get help right away if your are not doing well or get  worse. Document Released: 02/18/2008 Document Revised: 05/30/2011 Document Reviewed: 02/18/2008 [employer] Patient Information 2015 [place], [place]. This information is not intended to replace advice given to you by your health care provider. Make sure you discuss any questions you have with your health care provider.

## 2014-10-31 ENCOUNTER — Other Ambulatory Visit: Payer: Self-pay | Admitting: Internal Medicine

## 2014-10-31 LAB — HEMOGLOBIN A1C
Hgb A1c MFr Bld: 7.9 % — ABNORMAL HIGH (ref <5.7)
MEAN PLASMA GLUCOSE: 180 mg/dL — AB (ref <117)

## 2014-10-31 LAB — INSULIN, RANDOM: Insulin: 39.5 u[IU]/mL — ABNORMAL HIGH (ref 2.0–19.6)

## 2014-10-31 MED ORDER — SITAGLIPTIN PHOSPHATE 100 MG PO TABS: 100.0000 mg | ORAL_TABLET | Freq: Every day | ORAL | Status: DC

## 2014-12-14 ENCOUNTER — Other Ambulatory Visit: Payer: Self-pay | Admitting: Internal Medicine

## 2014-12-14 DIAGNOSIS — I10 Essential (primary) hypertension: Secondary | ICD-10-CM

## 2014-12-14 MED ORDER — LISINOPRIL-HYDROCHLOROTHIAZIDE 20-12.5 MG PO TABS: 1.0000 | ORAL_TABLET | Freq: Every day | ORAL | Status: DC

## 2014-12-21 ENCOUNTER — Other Ambulatory Visit: Payer: Self-pay | Admitting: Internal Medicine

## 2015-01-25 ENCOUNTER — Other Ambulatory Visit: Payer: Self-pay | Admitting: Internal Medicine

## 2015-01-28 ENCOUNTER — Other Ambulatory Visit: Payer: Self-pay | Admitting: Internal Medicine

## 2015-02-11 ENCOUNTER — Ambulatory Visit: Payer: Self-pay | Admitting: Internal Medicine

## 2015-02-22 ENCOUNTER — Other Ambulatory Visit: Payer: Self-pay | Admitting: Internal Medicine

## 2015-02-28 ENCOUNTER — Other Ambulatory Visit: Payer: Self-pay | Admitting: Internal Medicine

## 2015-03-02 ENCOUNTER — Other Ambulatory Visit: Payer: Self-pay | Admitting: *Deleted

## 2015-03-02 MED ORDER — LISINOPRIL 20 MG PO TABS: ORAL_TABLET | ORAL | Status: DC

## 2015-03-18 ENCOUNTER — Other Ambulatory Visit: Payer: Self-pay | Admitting: Obstetrics and Gynecology

## 2015-03-19 LAB — CYTOLOGY - PAP

## 2015-03-24 ENCOUNTER — Ambulatory Visit: Payer: Self-pay | Admitting: Internal Medicine

## 2015-04-14 ENCOUNTER — Encounter: Payer: Self-pay | Admitting: Internal Medicine

## 2015-04-14 ENCOUNTER — Ambulatory Visit (INDEPENDENT_AMBULATORY_CARE_PROVIDER_SITE_OTHER): Payer: Managed Care, Other (non HMO) | Admitting: Internal Medicine

## 2015-04-14 VITALS — BP 130/84 | HR 78 | Temp 98.0°F | Resp 18 | Ht 64.5 in | Wt 230.0 lb

## 2015-04-14 DIAGNOSIS — Z79899 Other long term (current) drug therapy: Secondary | ICD-10-CM

## 2015-04-14 DIAGNOSIS — D509 Iron deficiency anemia, unspecified: Secondary | ICD-10-CM | POA: Diagnosis not present

## 2015-04-14 DIAGNOSIS — E1129 Type 2 diabetes mellitus with other diabetic kidney complication: Secondary | ICD-10-CM

## 2015-04-14 DIAGNOSIS — K760 Fatty (change of) liver, not elsewhere classified: Secondary | ICD-10-CM | POA: Diagnosis not present

## 2015-04-14 DIAGNOSIS — I10 Essential (primary) hypertension: Secondary | ICD-10-CM | POA: Diagnosis not present

## 2015-04-14 LAB — CBC WITH DIFFERENTIAL/PLATELET
BASOS ABS: 0.1 10*3/uL (ref 0.0–0.1)
Basophils Relative: 1 % (ref 0–1)
Eosinophils Absolute: 0.2 10*3/uL (ref 0.0–0.7)
Eosinophils Relative: 2 % (ref 0–5)
HEMATOCRIT: 42.2 % (ref 36.0–46.0)
HEMOGLOBIN: 14.2 g/dL (ref 12.0–15.0)
LYMPHS ABS: 3.8 10*3/uL (ref 0.7–4.0)
LYMPHS PCT: 44 % (ref 12–46)
MCH: 27.4 pg (ref 26.0–34.0)
MCHC: 33.6 g/dL (ref 30.0–36.0)
MCV: 81.5 fL (ref 78.0–100.0)
MONO ABS: 0.4 10*3/uL (ref 0.1–1.0)
MPV: 9 fL (ref 8.6–12.4)
Monocytes Relative: 5 % (ref 3–12)
NEUTROS ABS: 4.2 10*3/uL (ref 1.7–7.7)
Neutrophils Relative %: 48 % (ref 43–77)
Platelets: 350 10*3/uL (ref 150–400)
RBC: 5.18 MIL/uL — AB (ref 3.87–5.11)
RDW: 14.8 % (ref 11.5–15.5)
WBC: 8.7 10*3/uL (ref 4.0–10.5)

## 2015-04-14 LAB — BASIC METABOLIC PANEL WITH GFR
BUN: 15 mg/dL (ref 7–25)
CO2: 32 mmol/L — AB (ref 20–31)
Calcium: 9.5 mg/dL (ref 8.6–10.2)
Chloride: 102 mmol/L (ref 98–110)
Creat: 1.03 mg/dL (ref 0.50–1.10)
GFR, Est African American: 74 mL/min (ref >=60)
GFR, Est Non African American: 64 mL/min (ref >=60)
GLUCOSE: 119 mg/dL — AB (ref 65–99)
POTASSIUM: 4.2 mmol/L (ref 3.5–5.3)
Sodium: 141 mmol/L (ref 135–146)

## 2015-04-14 LAB — LIPID PANEL
CHOL/HDL RATIO: 4.6 ratio (ref <=5.0)
CHOLESTEROL: 170 mg/dL (ref 125–200)
HDL: 37 mg/dL — ABNORMAL LOW (ref >=46)
LDL Cholesterol: 79 mg/dL (ref <130)
TRIGLYCERIDES: 272 mg/dL — AB (ref <150)
VLDL: 54 mg/dL — AB (ref <30)

## 2015-04-14 LAB — HEPATIC FUNCTION PANEL
ALBUMIN: 4.3 g/dL (ref 3.6–5.1)
ALT: 43 U/L — AB (ref 6–29)
AST: 27 U/L (ref 10–35)
Alkaline Phosphatase: 48 U/L (ref 33–115)
BILIRUBIN DIRECT: 0.1 mg/dL (ref <=0.2)
BILIRUBIN TOTAL: 0.5 mg/dL (ref 0.2–1.2)
Indirect Bilirubin: 0.4 mg/dL (ref 0.2–1.2)
Total Protein: 7 g/dL (ref 6.1–8.1)

## 2015-04-14 LAB — TSH: TSH: 2.906 u[IU]/mL (ref 0.350–4.500)

## 2015-04-14 NOTE — Progress Notes (Signed)
Patient ID: Jenny Singleton, female   DOB: 11/09/1965, 50 y.o.   MRN: [id number]  Assessment and Plan:  Hypertension:  -Continue medication -monitor blood pressure at home. -Continue DASH diet -Reminder to go to the ER if any CP, SOB, nausea, dizziness, severe HA, changes vision/speech, left arm numbness and tingling and jaw pain.  Cholesterol - Continue diet and exercise -Check cholesterol.   Diabetes with diabetic chronic kidney disease -Continue diet and exercise.  -Check A1C  Vitamin D Def -check level -continue medications.   Continue diet and meds as discussed. Further disposition pending results of labs. Discussed med's effects and SE's.    HPI 50 y.o. female  presents for 3 month follow up with hypertension, hyperlipidemia, diabetes and vitamin D deficiency.   Her blood pressure has been controlled at home, today their BP is BP: 130/84 mmHg.She does workout. She denies chest pain, shortness of breath, dizziness.   She is on cholesterol medication and denies myalgias. Her cholesterol is at goal. The cholesterol was:  10/30/2014: Cholesterol 168; HDL 42*; LDL Cholesterol 84; Triglycerides 209*   She has been working on diet and exercise for diabetes with diabetic chronic kidney disease, she is on bASA, she is on ACE/ARB, and denies  foot ulcerations, hyperglycemia, hypoglycemia , increased appetite, nausea, paresthesia of the feet, polydipsia, polyuria, visual disturbances, vomiting and weight loss. Last A1C was: 10/30/2014: Hgb A1c MFr Bld 7.9*   Patient is on Vitamin D supplement. 10/30/2014: Vit D, 25-Hydroxy 35  She did have hernia surgery in late November.  She reports that this has curtailed her eating a lot.    Current Medications:  Current Outpatient Prescriptions on File Prior to Visit  Medication Sig Dispense Refill  . canagliflozin (INVOKANA) 300 MG TABS tablet Take 300 mg by mouth daily before breakfast. 90 tablet 1  . Cholecalciferol (VITAMIN D-3) 5000 UNITS TABS  Take by mouth daily.    . Coenzyme Q10 (COQ10 PO) Take by mouth daily.    Marland Kitchen glucose blood test strip Use as instructed 100 each 12  . glucose monitoring kit (FREESTYLE) monitoring kit 1 each by Does not apply route as needed for other. Use as directed 1 each 0  . JANUVIA 100 MG tablet TAKE 1 TABLET DAILY. 90 tablet 1  . lisinopril (PRINIVIL,ZESTRIL) 20 MG tablet TAKE 2 TABLETS (40 MG TOTAL) BY MOUTH DAILY. 180 tablet 0  . lisinopril-hydrochlorothiazide (PRINZIDE,ZESTORETIC) 20-12.5 MG per tablet Take 1 tablet by mouth daily. 90 tablet 1  . Magnesium 500 MG CAPS Take 1 capsule by mouth daily. Taking 995m daily    . metFORMIN (GLUCOPHAGE-XR) 500 MG 24 hr tablet TAKE (2) TABLETS TWICE DAILY. 360 tablet 1  . omeprazole (PRILOSEC) 20 MG capsule TAKE 1 CAPSULE EVERY DAY. 30 capsule 0  . pravastatin (PRAVACHOL) 40 MG tablet TAKE 1 TABLET EACH DAY. 90 tablet 1   No current facility-administered medications on file prior to visit.   Medical History:  Past Medical History  Diagnosis Date  . Hypertension   . OSA on CPAP   . Vitamin D deficiency   . Iron deficiency   . Exercise-induced asthma   . Diabetes mellitus   . Hyperlipidemia   . Kidney stones   . Hernia   . Obesity   . Colon polyps 07/11/2012    TUBULOVILLOUS ADENOMA  . Colitis 07/11/2012   Allergies:  Allergies  Allergen Reactions  . Welchol [Colesevelam Hcl]     HIves     Review of Systems:  Review of Systems  Constitutional: Negative for fever, chills and malaise/fatigue.  HENT: Negative for congestion, ear pain and sore throat.   Eyes: Negative.   Respiratory: Negative for cough, shortness of breath and wheezing.   Cardiovascular: Negative for chest pain, palpitations and leg swelling.  Gastrointestinal: Negative for heartburn, diarrhea, constipation, blood in stool and melena.  Genitourinary: Negative.   Skin: Negative.   Neurological: Negative for dizziness, sensory change, loss of consciousness and headaches.   Psychiatric/Behavioral: Negative for depression. The patient is not nervous/anxious and does not have insomnia.     Family history- Review and unchanged  Social history- Review and unchanged  Physical Exam: BP 130/84 mmHg  Pulse 78  Temp(Src) 98 F (36.7 C) (Temporal)  Resp 18  Ht 5' 4.5" (1.638 m)  Wt 230 lb (104.327 kg)  BMI 38.88 kg/m2 Wt Readings from Last 3 Encounters:  04/14/15 230 lb (104.327 kg)  10/30/14 241 lb (109.317 kg)  09/25/14 240 lb 6.4 oz (109.045 kg)   General Appearance: Well nourished well developed, non-toxic appearing, in no apparent distress. Eyes: PERRLA, EOMs, conjunctiva no swelling or erythema ENT/Mouth: Ear canals clear with no erythema, swelling, or discharge.  TMs normal bilaterally, oropharynx clear, moist, with no exudate.   Neck: Supple, thyroid normal, no JVD, no cervical adenopathy.  Respiratory: Respiratory effort normal, breath sounds clear A&P, no wheeze, rhonchi or rales noted.  No retractions, no accessory muscle usage Cardio: RRR with no MRGs. No noted edema.  Abdomen: Soft, + BS.  Non tender, no guarding, rebound, hernias, masses. Musculoskeletal: Full ROM, 5/5 strength, Normal gait Skin: Warm, dry without rashes, lesions, ecchymosis.  Neuro: Awake and oriented X 3, Cranial nerves intact. No cerebellar symptoms.  Psych: normal affect, Insight and Judgment appropriate.    Starlyn Skeans, PA-C 4:24 PM [facility]

## 2015-04-15 LAB — HEMOGLOBIN A1C
Hgb A1c MFr Bld: 6.6 % — ABNORMAL HIGH (ref <5.7)
Mean Plasma Glucose: 143 mg/dL — ABNORMAL HIGH (ref <117)

## 2015-04-19 ENCOUNTER — Other Ambulatory Visit: Payer: Self-pay | Admitting: Internal Medicine

## 2015-05-16 ENCOUNTER — Other Ambulatory Visit: Payer: Self-pay | Admitting: Internal Medicine

## 2015-05-23 ENCOUNTER — Other Ambulatory Visit: Payer: Self-pay | Admitting: Internal Medicine

## 2015-05-28 ENCOUNTER — Encounter: Payer: Self-pay | Admitting: Gastroenterology

## 2015-05-30 ENCOUNTER — Other Ambulatory Visit: Payer: Self-pay | Admitting: Internal Medicine

## 2015-06-06 ENCOUNTER — Other Ambulatory Visit: Payer: Self-pay | Admitting: Internal Medicine

## 2015-06-27 ENCOUNTER — Other Ambulatory Visit: Payer: Self-pay | Admitting: Internal Medicine

## 2015-06-27 ENCOUNTER — Other Ambulatory Visit: Payer: Self-pay | Admitting: Physician Assistant

## 2015-06-29 ENCOUNTER — Other Ambulatory Visit: Payer: Self-pay | Admitting: Internal Medicine

## 2015-06-29 MED ORDER — LISINOPRIL 20 MG PO TABS: 20.0000 mg | ORAL_TABLET | Freq: Every day | ORAL | Status: DC

## 2015-06-30 ENCOUNTER — Encounter: Payer: Self-pay | Admitting: Gastroenterology

## 2015-07-02 ENCOUNTER — Encounter: Payer: Managed Care, Other (non HMO) | Admitting: Internal Medicine

## 2015-07-13 ENCOUNTER — Other Ambulatory Visit: Payer: Self-pay | Admitting: Emergency Medicine

## 2015-07-20 ENCOUNTER — Encounter: Payer: Self-pay | Admitting: Internal Medicine

## 2015-07-20 ENCOUNTER — Ambulatory Visit (INDEPENDENT_AMBULATORY_CARE_PROVIDER_SITE_OTHER): Payer: Managed Care, Other (non HMO) | Admitting: Internal Medicine

## 2015-07-20 ENCOUNTER — Other Ambulatory Visit: Payer: Self-pay | Admitting: Internal Medicine

## 2015-07-20 VITALS — BP 126/80 | HR 72 | Temp 98.0°F | Resp 18 | Ht 64.5 in | Wt 234.0 lb

## 2015-07-20 DIAGNOSIS — E559 Vitamin D deficiency, unspecified: Secondary | ICD-10-CM | POA: Diagnosis not present

## 2015-07-20 DIAGNOSIS — Z136 Encounter for screening for cardiovascular disorders: Secondary | ICD-10-CM | POA: Diagnosis not present

## 2015-07-20 DIAGNOSIS — R7401 Elevation of levels of liver transaminase levels: Secondary | ICD-10-CM

## 2015-07-20 DIAGNOSIS — Z1212 Encounter for screening for malignant neoplasm of rectum: Secondary | ICD-10-CM

## 2015-07-20 DIAGNOSIS — I10 Essential (primary) hypertension: Secondary | ICD-10-CM

## 2015-07-20 DIAGNOSIS — D509 Iron deficiency anemia, unspecified: Secondary | ICD-10-CM

## 2015-07-20 DIAGNOSIS — R740 Nonspecific elevation of levels of transaminase and lactic acid dehydrogenase [LDH]: Secondary | ICD-10-CM

## 2015-07-20 DIAGNOSIS — Z0000 Encounter for general adult medical examination without abnormal findings: Secondary | ICD-10-CM | POA: Diagnosis not present

## 2015-07-20 DIAGNOSIS — E785 Hyperlipidemia, unspecified: Secondary | ICD-10-CM

## 2015-07-20 DIAGNOSIS — K760 Fatty (change of) liver, not elsewhere classified: Secondary | ICD-10-CM

## 2015-07-20 DIAGNOSIS — E1129 Type 2 diabetes mellitus with other diabetic kidney complication: Secondary | ICD-10-CM

## 2015-07-20 DIAGNOSIS — E669 Obesity, unspecified: Secondary | ICD-10-CM

## 2015-07-20 DIAGNOSIS — N390 Urinary tract infection, site not specified: Secondary | ICD-10-CM | POA: Diagnosis not present

## 2015-07-20 DIAGNOSIS — Z79899 Other long term (current) drug therapy: Secondary | ICD-10-CM

## 2015-07-20 DIAGNOSIS — Z0001 Encounter for general adult medical examination with abnormal findings: Secondary | ICD-10-CM

## 2015-07-20 MED ORDER — LISINOPRIL-HYDROCHLOROTHIAZIDE 20-12.5 MG PO TABS: 1.0000 | ORAL_TABLET | Freq: Every day | ORAL | Status: DC

## 2015-07-20 MED ORDER — PRAVASTATIN SODIUM 40 MG PO TABS: ORAL_TABLET | ORAL | Status: DC

## 2015-07-20 MED ORDER — METFORMIN HCL ER 500 MG PO TB24: ORAL_TABLET | ORAL | Status: DC

## 2015-07-20 MED ORDER — SITAGLIPTIN PHOSPHATE 100 MG PO TABS: 100.0000 mg | ORAL_TABLET | Freq: Every day | ORAL | Status: DC

## 2015-07-20 MED ORDER — CANAGLIFLOZIN 300 MG PO TABS: ORAL_TABLET | ORAL | Status: DC

## 2015-07-20 MED ORDER — HYOSCYAMINE SULFATE 0.125 MG PO TABS: 0.1250 mg | ORAL_TABLET | Freq: Four times a day (QID) | ORAL | Status: DC | PRN

## 2015-07-20 NOTE — Progress Notes (Signed)
Complete Physical  Assessment and Plan:   1. Encounter for general adult medical examination with abnormal findings  - CBC with Differential/Platelet - BASIC METABOLIC PANEL WITH GFR - Hepatic function panel - Magnesium  2. Essential hypertension  - Urinalysis, Routine w reflex microscopic (not at [facility]) - Microalbumin / creatinine urine ratio - EKG 12-Lead - [place], RETROPERITNL ABD,  LTD - TSH  3. Type 2 diabetes mellitus with other diabetic kidney complication, without long-term current use of insulin (HCC) -cont meds -diet and exercise - Hemoglobin A1c - Insulin, random  4. Iron deficiency anemia, unspecified  - Iron and TIBC - Vitamin B12  5. Fatty liver -cont pravastatin - Lipid panel  6. Transaminitis -due to fatty liver  7. Obesity -diet and exercise  8. Hyperlipidemia -cont pravastatin - Lipid panel  9. Vitamin D deficiency -cont supplement - VITAMIN D 25 Hydroxy (Vit-D Deficiency, Fractures)    Discussed med's effects and SE's. Screening labs and tests as requested with regular follow-up as recommended.  HPI  50 y.o. female  presents for a complete physical.  Her blood pressure has been controlled at home, today their BP is BP: 126/80 mmHg.  She does not workout. She denies chest pain, shortness of breath, dizziness.   She is on cholesterol medication and denies myalgias. Her cholesterol is not at goal. The cholesterol last visit was:  Lab Results  Component Value Date   CHOL 170 04/14/2015   HDL 37* 04/14/2015   LDLCALC 79 04/14/2015   TRIG 272* 04/14/2015   CHOLHDL 4.6 04/14/2015  .  She has been working on diet and exercise for prediabetes, she is not on bASA, she is on ACE/ARB and denies foot ulcerations, hyperglycemia, hypoglycemia , increased appetite, nausea, paresthesia of the feet, polydipsia, polyuria, visual disturbances, vomiting and weight loss. Last A1C in the office was:  Lab Results  Component Value Date   HGBA1C 6.6*  04/14/2015    Patient is on Vitamin D supplement.   Lab Results  Component Value Date   VD25OH 35 10/30/2014     She reports that she is due to see Dr. Fuller Plan again in June for repeat colonoscopy.  She reports that she did have a polyp in 2014.   She does report that she has done well with her hernia surgery.  She does still have some bulging around the area of the hernia.  She reports that she had some soreness without wearing her binder.  She does notice more bulging and swelling with standing and walking.  She did not have mesh placed.  This was done by Dr. Georgia Lopes.  She has not gone back yet.    She did see Obgyn and did have her mammogram and her pap smear.  She reports that everything was normal.     Current Medications:  Current Outpatient Prescriptions on File Prior to Visit  Medication Sig Dispense Refill  . Cholecalciferol (VITAMIN D-3) 5000 UNITS TABS Take by mouth daily.    . Coenzyme Q10 (COQ10 PO) Take by mouth daily.    Marland Kitchen glucose blood test strip Use as instructed 100 each 12  . glucose monitoring kit (FREESTYLE) monitoring kit 1 each by Does not apply route as needed for other. Use as directed 1 each 0  . hyoscyamine (LEVSIN, ANASPAZ) 0.125 MG tablet TAKE  (1)  TABLET  EVERY FOUR HOURS AS NEEDED. 60 tablet 0  . INVOKANA 300 MG TABS tablet TAKE 1 TABLET BEFORE BREAKFAST. 90 tablet 1  .  JANUVIA 100 MG tablet TAKE 1 TABLET DAILY. 90 tablet 1  . lisinopril (PRINIVIL,ZESTRIL) 20 MG tablet Take 1 tablet (20 mg total) by mouth daily. 90 tablet 0  . lisinopril-hydrochlorothiazide (PRINZIDE,ZESTORETIC) 20-12.5 MG tablet TAKE 1 TABLET ONCE DAILY. 90 tablet 1  . Magnesium 500 MG CAPS Take 1 capsule by mouth daily. Taking '950mg'$  daily    . metFORMIN (GLUCOPHAGE-XR) 500 MG 24 hr tablet TAKE (2) TABLETS TWICE DAILY. 360 tablet 1  . omeprazole (PRILOSEC) 20 MG capsule TAKE 1 CAPSULE EVERY DAY. 90 capsule 1  . pravastatin (PRAVACHOL) 40 MG tablet TAKE 1 TABLET EACH DAY. 90 tablet 1    No current facility-administered medications on file prior to visit.    Health Maintenance:   Immunization History  Administered Date(s) Administered  . Influenza Whole 12/19/2008  . PPD Test 05/23/2013  . Td 03/21/2006    Tetanus: 2008 Flu vaccine: 2016 QBH:ALPFXT post ablation, no further periods.  Pap: 2017 MGM: 2017 DEXA: Not due Colonoscopy: 2014 EGD: 2014 Last Dental Exam: Dr. Judeth Horn Last Eye Exam:  Dr. Marvel Plan, May 20th.    Patient Care Team: Unk Pinto, MD as PCP - General (Internal Medicine) Ladene Artist, MD as Consulting Physician (Gastroenterology) Remer Macho, MD as Attending Physician (Ophthalmology) Lenore Cordia, MD (Obstetrics and Gynecology) Frederik Pear, MD as Consulting Physician (Orthopedic Surgery) Rolan Bucco, MD as Consulting Physician (Urology) Sherlyn Lick, MD (Dermatology)  Allergies:  Allergies  Allergen Reactions  . Welchol [Colesevelam Hcl]     HIves    Medical History:  Past Medical History  Diagnosis Date  . Hypertension   . OSA on CPAP   . Vitamin D deficiency   . Iron deficiency   . Exercise-induced asthma   . Diabetes mellitus   . Hyperlipidemia   . Kidney stones   . Hernia   . Obesity   . Colon polyps 07/11/2012    TUBULOVILLOUS ADENOMA  . Colitis 07/11/2012    Surgical History:  Past Surgical History  Procedure Laterality Date  . Tonsilectomy, adenoidectomy, bilateral myringotomy and tubes    . Ablation    . Abdominoplasty    . Esophagogastroduodenoscopy  07/11/2012    Normal   . Colonoscopy w/ biopsies  07/11/2012    Family History:  Family History  Problem Relation Age of Onset  . Adopted: Yes    Social History:  Social History  Substance Use Topics  . Smoking status: Former Smoker -- 1.00 packs/day for 2 years    Quit date: 03/21/1985  . Smokeless tobacco: Never Used     Comment: smoked age [age]  . Alcohol Use: No    Review of Systems: Review of Systems   Constitutional: Negative for fever, chills and malaise/fatigue.  HENT: Negative for congestion, ear pain and sore throat.   Eyes: Negative.   Respiratory: Negative for cough, shortness of breath and wheezing.   Cardiovascular: Negative for chest pain, palpitations and leg swelling.  Gastrointestinal: Negative for heartburn, abdominal pain, diarrhea, constipation, blood in stool and melena.  Genitourinary: Negative.   Skin: Negative.   Neurological: Negative for dizziness, sensory change, loss of consciousness and headaches.  Psychiatric/Behavioral: Negative for depression. The patient is not nervous/anxious and does not have insomnia.     Physical Exam: Estimated body mass index is 39.56 kg/(m^2) as calculated from the following:   Height as of this encounter: 5' 4.5" (1.638 m).   Weight as of this encounter: 234 lb (106.142 kg). BP 126/80 mmHg  Temp(Src)  68 F (36.7 C) (Temporal)  Resp 18  Ht 5' 4.5" (1.638 m)  Wt 234 lb (106.142 kg)  BMI 39.56 kg/m2  General Appearance: Well nourished well developed, in no apparent distress.  Eyes: PERRLA, EOMs, conjunctiva no swelling or erythema ENT/Mouth: Ear canals normal without obstruction, swelling, erythema, or discharge.  TMs normal bilaterally with no erythema, bulging, retraction, or loss of landmark.  Oropharynx moist and clear with no exudate, erythema, or swelling.   Neck: Supple, thyroid normal. No bruits.  No cervical adenopathy Respiratory: Respiratory effort normal, Breath sounds clear A&P without wheeze, rhonchi, rales.   Cardio: RRR without murmurs, rubs or gallops. Brisk peripheral pulses without edema.  Chest: symmetric, with normal excursions Breasts: Deferred to obgyn Abdomen: Soft, nontender, no guarding, rebound, mild bulging of the left periumbilical region without defined hernia noted on exam.  Exam difficult secondary to body habitus, masses, or organomegaly.  Lymphatics: Non tender without lymphadenopathy.   Genitourinary: Deferred to Obgyn Musculoskeletal: Full ROM all peripheral extremities,5/5 strength, and normal gait.  Skin: Warm, dry without rashes, lesions, ecchymosis. Neuro: Awake and oriented X 3, Cranial nerves intact, reflexes equal bilaterally. Normal muscle tone, no cerebellar symptoms. Sensation intact.  Psych:  normal affect, Insight and Judgment appropriate.   EKG: WNL no changes.  AORTA SCAN: WNL   Over 40 minutes of exam, counseling, chart review and critical decision making was performed  Loma Sousa Forcucci 3:15 PM [facility]

## 2015-07-21 LAB — LIPID PANEL
CHOL/HDL RATIO: 4.3 ratio (ref <=5.0)
Cholesterol: 183 mg/dL (ref 125–200)
HDL: 43 mg/dL — AB (ref >=46)
LDL CALC: 92 mg/dL (ref <130)
TRIGLYCERIDES: 240 mg/dL — AB (ref <150)
VLDL: 48 mg/dL — ABNORMAL HIGH (ref <30)

## 2015-07-21 LAB — CBC WITH DIFFERENTIAL/PLATELET
BASOS PCT: 0 %
Basophils Absolute: 0 cells/uL (ref 0–200)
EOS ABS: 192 {cells}/uL (ref 15–500)
EOS PCT: 2 %
HCT: 44 % (ref 35.0–45.0)
Hemoglobin: 14.7 g/dL (ref 11.7–15.5)
LYMPHS PCT: 41 %
Lymphs Abs: 3936 cells/uL — ABNORMAL HIGH (ref 850–3900)
MCH: 27.3 pg (ref 27.0–33.0)
MCHC: 33.4 g/dL (ref 32.0–36.0)
MCV: 81.6 fL (ref 80.0–100.0)
MONOS PCT: 6 %
MPV: 9.3 fL (ref 7.5–12.5)
Monocytes Absolute: 576 cells/uL (ref 200–950)
NEUTROS ABS: 4896 {cells}/uL (ref 1500–7800)
Neutrophils Relative %: 51 %
PLATELETS: 346 10*3/uL (ref 140–400)
RBC: 5.39 MIL/uL — AB (ref 3.80–5.10)
RDW: 16.6 % — AB (ref 11.0–15.0)
WBC: 9.6 10*3/uL (ref 3.8–10.8)

## 2015-07-21 LAB — URINALYSIS, ROUTINE W REFLEX MICROSCOPIC
Bilirubin Urine: NEGATIVE
Hgb urine dipstick: NEGATIVE
Ketones, ur: NEGATIVE
LEUKOCYTES UA: NEGATIVE
NITRITE: NEGATIVE
PROTEIN: NEGATIVE
SPECIFIC GRAVITY, URINE: 1.036 — AB (ref 1.001–1.035)
pH: 5.5 (ref 5.0–8.0)

## 2015-07-21 LAB — HEPATIC FUNCTION PANEL
ALBUMIN: 4.6 g/dL (ref 3.6–5.1)
ALK PHOS: 51 U/L (ref 33–115)
ALT: 54 U/L — ABNORMAL HIGH (ref 6–29)
AST: 27 U/L (ref 10–35)
BILIRUBIN DIRECT: 0.1 mg/dL (ref <=0.2)
BILIRUBIN INDIRECT: 0.4 mg/dL (ref 0.2–1.2)
BILIRUBIN TOTAL: 0.5 mg/dL (ref 0.2–1.2)
Total Protein: 7.3 g/dL (ref 6.1–8.1)

## 2015-07-21 LAB — IRON AND TIBC
%SAT: 19 % (ref 11–50)
Iron: 81 ug/dL (ref 40–190)
TIBC: 424 ug/dL (ref 250–450)
UIBC: 343 ug/dL (ref 125–400)

## 2015-07-21 LAB — INSULIN, RANDOM: Insulin: 69.2 u[IU]/mL — ABNORMAL HIGH (ref 2.0–19.6)

## 2015-07-21 LAB — BASIC METABOLIC PANEL WITH GFR
BUN: 15 mg/dL (ref 7–25)
CHLORIDE: 101 mmol/L (ref 98–110)
CO2: 28 mmol/L (ref 20–31)
Calcium: 10.4 mg/dL — ABNORMAL HIGH (ref 8.6–10.2)
Creat: 1.02 mg/dL (ref 0.50–1.10)
GFR, EST AFRICAN AMERICAN: 75 mL/min (ref >=60)
GFR, EST NON AFRICAN AMERICAN: 65 mL/min (ref >=60)
Glucose, Bld: 87 mg/dL (ref 65–99)
POTASSIUM: 4.3 mmol/L (ref 3.5–5.3)
SODIUM: 141 mmol/L (ref 135–146)

## 2015-07-21 LAB — VITAMIN D 25 HYDROXY (VIT D DEFICIENCY, FRACTURES): VIT D 25 HYDROXY: 35 ng/mL (ref 30–100)

## 2015-07-21 LAB — MICROALBUMIN / CREATININE URINE RATIO
CREATININE, URINE: 113 mg/dL (ref 20–320)
MICROALB UR: 0.3 mg/dL
MICROALB/CREAT RATIO: 3 ug/mg{creat} (ref <30)

## 2015-07-21 LAB — HEMOGLOBIN A1C
HEMOGLOBIN A1C: 6.9 % — AB (ref <5.7)
MEAN PLASMA GLUCOSE: 151 mg/dL

## 2015-07-21 LAB — URINALYSIS, MICROSCOPIC ONLY
CASTS: NONE SEEN [LPF]
WBC, UA: NONE SEEN WBC/HPF (ref <=5)

## 2015-07-21 LAB — MAGNESIUM: Magnesium: 2 mg/dL (ref 1.5–2.5)

## 2015-07-21 LAB — VITAMIN B12: VITAMIN B 12: 382 pg/mL (ref 200–1100)

## 2015-07-21 LAB — TSH: TSH: 2.34 m[IU]/L

## 2015-07-22 LAB — URINE CULTURE

## 2015-09-18 ENCOUNTER — Encounter: Payer: Self-pay | Admitting: Gastroenterology

## 2015-10-27 ENCOUNTER — Other Ambulatory Visit: Payer: Self-pay | Admitting: Internal Medicine

## 2015-11-11 ENCOUNTER — Other Ambulatory Visit: Payer: Self-pay | Admitting: Internal Medicine

## 2015-11-15 ENCOUNTER — Other Ambulatory Visit: Payer: Self-pay | Admitting: Internal Medicine

## 2015-11-16 MED ORDER — LISINOPRIL 20 MG PO TABS: 20.0000 mg | ORAL_TABLET | Freq: Every day | ORAL | 0 refills | Status: DC

## 2015-11-30 ENCOUNTER — Ambulatory Visit (INDEPENDENT_AMBULATORY_CARE_PROVIDER_SITE_OTHER): Payer: Managed Care, Other (non HMO) | Admitting: Internal Medicine

## 2015-11-30 ENCOUNTER — Encounter: Payer: Self-pay | Admitting: Internal Medicine

## 2015-11-30 VITALS — BP 136/98 | HR 72 | Temp 97.4°F | Resp 16 | Ht 64.5 in | Wt 237.4 lb

## 2015-11-30 DIAGNOSIS — E785 Hyperlipidemia, unspecified: Secondary | ICD-10-CM | POA: Diagnosis not present

## 2015-11-30 DIAGNOSIS — Z79899 Other long term (current) drug therapy: Secondary | ICD-10-CM

## 2015-11-30 DIAGNOSIS — E1129 Type 2 diabetes mellitus with other diabetic kidney complication: Secondary | ICD-10-CM

## 2015-11-30 DIAGNOSIS — I10 Essential (primary) hypertension: Secondary | ICD-10-CM | POA: Diagnosis not present

## 2015-11-30 DIAGNOSIS — Z9989 Dependence on other enabling machines and devices: Secondary | ICD-10-CM

## 2015-11-30 DIAGNOSIS — G4733 Obstructive sleep apnea (adult) (pediatric): Secondary | ICD-10-CM

## 2015-11-30 LAB — BASIC METABOLIC PANEL WITH GFR
BUN: 18 mg/dL (ref 7–25)
CHLORIDE: 100 mmol/L (ref 98–110)
CO2: 27 mmol/L (ref 20–31)
CREATININE: 1.07 mg/dL — AB (ref 0.50–1.05)
Calcium: 10 mg/dL (ref 8.6–10.4)
GFR, EST NON AFRICAN AMERICAN: 61 mL/min (ref >=60)
GFR, Est African American: 70 mL/min (ref >=60)
Glucose, Bld: 120 mg/dL — ABNORMAL HIGH (ref 65–99)
POTASSIUM: 4 mmol/L (ref 3.5–5.3)
SODIUM: 141 mmol/L (ref 135–146)

## 2015-11-30 LAB — CBC WITH DIFFERENTIAL/PLATELET
BASOS ABS: 0 {cells}/uL (ref 0–200)
Basophils Relative: 0 %
EOS PCT: 2 %
Eosinophils Absolute: 152 cells/uL (ref 15–500)
HCT: 44.5 % (ref 35.0–45.0)
HEMOGLOBIN: 15.2 g/dL (ref 11.7–15.5)
LYMPHS ABS: 4256 {cells}/uL — AB (ref 850–3900)
Lymphocytes Relative: 56 %
MCH: 28 pg (ref 27.0–33.0)
MCHC: 34.2 g/dL (ref 32.0–36.0)
MCV: 82.1 fL (ref 80.0–100.0)
MPV: 9.3 fL (ref 7.5–12.5)
Monocytes Absolute: 456 cells/uL (ref 200–950)
Monocytes Relative: 6 %
NEUTROS ABS: 2736 {cells}/uL (ref 1500–7800)
Neutrophils Relative %: 36 %
Platelets: 308 10*3/uL (ref 140–400)
RBC: 5.42 MIL/uL — AB (ref 3.80–5.10)
RDW: 14.9 % (ref 11.0–15.0)
WBC: 7.6 10*3/uL (ref 3.8–10.8)

## 2015-11-30 LAB — LIPID PANEL
CHOL/HDL RATIO: 5.1 ratio — AB (ref <=5.0)
CHOLESTEROL: 195 mg/dL (ref 125–200)
HDL: 38 mg/dL — AB (ref >=46)
LDL Cholesterol: 82 mg/dL (ref <130)
Triglycerides: 376 mg/dL — ABNORMAL HIGH (ref <150)
VLDL: 75 mg/dL — AB (ref <30)

## 2015-11-30 LAB — HEPATIC FUNCTION PANEL
ALT: 77 U/L — ABNORMAL HIGH (ref 6–29)
AST: 48 U/L — AB (ref 10–35)
Albumin: 4.7 g/dL (ref 3.6–5.1)
Alkaline Phosphatase: 44 U/L (ref 33–130)
BILIRUBIN DIRECT: 0.1 mg/dL (ref <=0.2)
BILIRUBIN TOTAL: 0.5 mg/dL (ref 0.2–1.2)
Indirect Bilirubin: 0.4 mg/dL (ref 0.2–1.2)
Total Protein: 7.4 g/dL (ref 6.1–8.1)

## 2015-11-30 NOTE — Progress Notes (Addendum)
Assessment and Plan:  Hypertension:  -monitor at home -if continued elevated can adjust medications -Continue medication -monitor blood pressure at home. -Continue DASH diet -Reminder to go to the ER if any CP, SOB, nausea, dizziness, severe HA, changes vision/speech, left arm numbness and tingling and jaw pain.  Cholesterol - Continue diet and exercise -Check cholesterol.   Diabetes with diabetic chronic kidney disease  -patient reports good control of CBGs at home -Continue diet and exercise.  -Check A1C  Vitamin D Def -continue medications.   Increased stress -patient taking care of mother with dementia and is struggling with balance at work, home, and being a care giver -recommended getting in touch with Department of social services to see what resources were available to her -recommended reading the 36 hour day to help give her an idea of the progression of the disease -recommended that she and her mother discuss what they are comfortable with happening as the disease progresses  Continue diet and meds as discussed. Further disposition pending results of labs. Discussed med's effects and SE's.    HPI 50 y.o. female  presents for 3 month follow up with hypertension, hyperlipidemia, diabetes and vitamin D deficiency.   Her blood pressure has been controlled at home, today their BP is BP: (!) 136/98.She does not workout. She denies chest pain, shortness of breath, dizziness.   She is on cholesterol medication and denies myalgias. Her cholesterol is at goal. The cholesterol was:  07/20/2015: Cholesterol 183; HDL 43; LDL Cholesterol 92; Triglycerides 240   She has been working on diet and exercise for diabetes with diabetic chronic kidney disease, she is on bASA, she is on ACE/ARB, and denies  foot ulcerations, hyperglycemia, hypoglycemia , increased appetite, nausea, paresthesia of the feet, polydipsia, polyuria, visual disturbances, vomiting and weight loss. Last A1C was:  07/20/2015: Hgb A1c MFr Bld 6.9.  She reports that she doesn't check blood sugars much.     Patient is on Vitamin D supplement. 07/20/2015: Vit D, 25-Hydroxy 35  She notes that she is taking care of her mother who has dementia.  She reports that her mother has been very stubborn and she is not willing to allow anybody to help her.  She goes to her mother's house 7 days a week.  She spends multiple hours every week.    She reports that her sleep apnea is doing well.  She is using her CPAP machine and does feel that she gets some benefit with it.  She does try to wear it every single nice.    Current Medications:  Current Outpatient Prescriptions on File Prior to Visit  Medication Sig Dispense Refill  . canagliflozin (INVOKANA) 300 MG TABS tablet TAKE 1 TABLET BEFORE BREAKFAST. 90 tablet 1  . Cholecalciferol (VITAMIN D-3) 5000 UNITS TABS Take by mouth daily.    . Coenzyme Q10 (COQ10 PO) Take by mouth daily.    Marland Kitchen glucose blood test strip Use as instructed 100 each 12  . glucose monitoring kit (FREESTYLE) monitoring kit 1 each by Does not apply route as needed for other. Use as directed 1 each 0  . hyoscyamine (LEVSIN, ANASPAZ) 0.125 MG tablet Take 1 tablet (0.125 mg total) by mouth every 6 (six) hours as needed. 60 tablet 3  . lisinopril (PRINIVIL,ZESTRIL) 20 MG tablet Take 1 tablet (20 mg total) by mouth daily. 90 tablet 0  . lisinopril-hydrochlorothiazide (PRINZIDE,ZESTORETIC) 20-12.5 MG tablet Take 1 tablet by mouth daily. 90 tablet 1  . Magnesium 500 MG CAPS  Take 1 capsule by mouth daily. Taking 941m daily    . metFORMIN (GLUCOPHAGE-XR) 500 MG 24 hr tablet TAKE (2) TABLETS TWICE DAILY. 120 tablet 0  . omeprazole (PRILOSEC) 20 MG capsule TAKE 1 CAPSULE EVERY DAY. 90 capsule 1  . pravastatin (PRAVACHOL) 40 MG tablet TAKE 1 TABLET EACH DAY. 90 tablet 1  . sitaGLIPtin (JANUVIA) 100 MG tablet Take 1 tablet (100 mg total) by mouth daily. 90 tablet 1   No current facility-administered medications on  file prior to visit.    Medical History:  Past Medical History:  Diagnosis Date  . Colitis 07/11/2012  . Colon polyps 07/11/2012   TUBULOVILLOUS ADENOMA  . Diabetes mellitus   . Exercise-induced asthma   . Hernia   . Hyperlipidemia   . Hypertension   . Iron deficiency   . Kidney stones   . Obesity   . OSA on CPAP   . Vitamin D deficiency    Allergies:  Allergies  Allergen Reactions  . Welchol [Colesevelam Hcl]     HIves     Review of Systems:  Review of Systems  Constitutional: Negative for chills, fever and malaise/fatigue.  HENT: Negative for congestion, ear pain and sore throat.   Eyes: Negative.   Respiratory: Negative for cough, shortness of breath and wheezing.   Cardiovascular: Negative for chest pain, palpitations and leg swelling.  Gastrointestinal: Negative for abdominal pain, blood in stool, constipation, diarrhea, heartburn and melena.  Genitourinary: Negative.   Skin: Negative.   Neurological: Negative for dizziness, sensory change, loss of consciousness and headaches.  Psychiatric/Behavioral: Negative for depression. The patient is not nervous/anxious and does not have insomnia.     Family history- Review and unchanged  Social history- Review and unchanged  Physical Exam: BP (!) 136/98   Pulse 72   Temp 97.4 F (36.3 C)   Resp 16   Ht 5' 4.5" (1.638 m)   Wt 237 lb 6.4 oz (107.7 kg)   BMI 40.12 kg/m  Wt Readings from Last 3 Encounters:  11/30/15 237 lb 6.4 oz (107.7 kg)  07/20/15 234 lb (106.1 kg)  04/14/15 230 lb (104.3 kg)   General Appearance: Well nourished well developed, non-toxic appearing, in no apparent distress. Eyes: PERRLA, EOMs, conjunctiva no swelling or erythema ENT/Mouth: Ear canals clear with no erythema, swelling, or discharge.  TMs normal bilaterally, oropharynx clear, moist, with no exudate.   Neck: Supple, thyroid normal, no JVD, no cervical adenopathy.  Respiratory: Respiratory effort normal, breath sounds clear A&P, no  wheeze, rhonchi or rales noted.  No retractions, no accessory muscle usage Cardio: RRR with no MRGs. No noted edema.  Abdomen: Soft, + BS.  Non tender, no guarding, rebound, hernias, masses. Musculoskeletal: Full ROM, 5/5 strength, Normal gait Skin: Warm, dry without rashes, lesions, ecchymosis.  Neuro: Awake and oriented X 3, Cranial nerves intact. No cerebellar symptoms.  Psych: normal affect, Insight and Judgment appropriate.    CStarlyn Skeans PA-C 4:46 PM [facility]

## 2015-11-30 NOTE — Patient Instructions (Addendum)
Please read the "36 Hour Day".  This will give you a general idea of what to expect with dementia.   Please try to contact the department of social services to talk to a social worker about possible long term care facilities.  It is important to know your options.  Please set up an appointment so that we can get your mom set up with a medication to help with her moods.

## 2015-12-01 ENCOUNTER — Encounter: Payer: Self-pay | Admitting: Internal Medicine

## 2015-12-01 LAB — TSH: TSH: 2.16 m[IU]/L

## 2015-12-01 LAB — HEMOGLOBIN A1C
Hgb A1c MFr Bld: 7 % — ABNORMAL HIGH (ref <5.7)
MEAN PLASMA GLUCOSE: 154 mg/dL

## 2016-01-13 ENCOUNTER — Encounter: Payer: Self-pay | Admitting: Internal Medicine

## 2016-01-24 ENCOUNTER — Other Ambulatory Visit: Payer: Self-pay | Admitting: Internal Medicine

## 2016-02-09 ENCOUNTER — Other Ambulatory Visit: Payer: Self-pay | Admitting: Physician Assistant

## 2016-02-25 ENCOUNTER — Other Ambulatory Visit: Payer: Self-pay | Admitting: Internal Medicine

## 2016-03-01 ENCOUNTER — Encounter: Payer: Self-pay | Admitting: Gastroenterology

## 2016-03-04 ENCOUNTER — Other Ambulatory Visit: Payer: Self-pay | Admitting: Internal Medicine

## 2016-03-04 ENCOUNTER — Encounter: Payer: Self-pay | Admitting: Internal Medicine

## 2016-03-06 MED ORDER — LISINOPRIL 20 MG PO TABS: 20.0000 mg | ORAL_TABLET | Freq: Every day | ORAL | 0 refills | Status: DC

## 2016-03-11 ENCOUNTER — Encounter: Payer: Self-pay | Admitting: Internal Medicine

## 2016-03-16 ENCOUNTER — Encounter: Payer: Self-pay | Admitting: Internal Medicine

## 2016-03-17 ENCOUNTER — Encounter: Payer: Self-pay | Admitting: Internal Medicine

## 2016-03-27 ENCOUNTER — Other Ambulatory Visit: Payer: Self-pay | Admitting: Internal Medicine

## 2016-04-18 ENCOUNTER — Ambulatory Visit (INDEPENDENT_AMBULATORY_CARE_PROVIDER_SITE_OTHER): Payer: Managed Care, Other (non HMO) | Admitting: Internal Medicine

## 2016-04-18 ENCOUNTER — Encounter: Payer: Self-pay | Admitting: Internal Medicine

## 2016-04-18 VITALS — BP 140/98 | HR 82 | Temp 98.0°F | Resp 18 | Ht 64.5 in | Wt 236.0 lb

## 2016-04-18 DIAGNOSIS — I10 Essential (primary) hypertension: Secondary | ICD-10-CM | POA: Diagnosis not present

## 2016-04-18 DIAGNOSIS — K760 Fatty (change of) liver, not elsewhere classified: Secondary | ICD-10-CM

## 2016-04-18 DIAGNOSIS — E1129 Type 2 diabetes mellitus with other diabetic kidney complication: Secondary | ICD-10-CM

## 2016-04-18 DIAGNOSIS — Z79899 Other long term (current) drug therapy: Secondary | ICD-10-CM

## 2016-04-18 DIAGNOSIS — R740 Nonspecific elevation of levels of transaminase and lactic acid dehydrogenase [LDH]: Secondary | ICD-10-CM | POA: Diagnosis not present

## 2016-04-18 DIAGNOSIS — D509 Iron deficiency anemia, unspecified: Secondary | ICD-10-CM

## 2016-04-18 DIAGNOSIS — E782 Mixed hyperlipidemia: Secondary | ICD-10-CM | POA: Diagnosis not present

## 2016-04-18 DIAGNOSIS — R7401 Elevation of levels of liver transaminase levels: Secondary | ICD-10-CM

## 2016-04-18 LAB — CBC WITH DIFFERENTIAL/PLATELET
Basophils Absolute: 0 cells/uL (ref 0–200)
Basophils Relative: 0 %
EOS PCT: 4 %
Eosinophils Absolute: 280 cells/uL (ref 15–500)
HCT: 43.8 % (ref 35.0–45.0)
Hemoglobin: 14.6 g/dL (ref 11.7–15.5)
LYMPHS PCT: 54 %
Lymphs Abs: 3780 cells/uL (ref 850–3900)
MCH: 27.9 pg (ref 27.0–33.0)
MCHC: 33.3 g/dL (ref 32.0–36.0)
MCV: 83.6 fL (ref 80.0–100.0)
MONOS PCT: 5 %
MPV: 9.7 fL (ref 7.5–12.5)
Monocytes Absolute: 350 cells/uL (ref 200–950)
NEUTROS PCT: 37 %
Neutro Abs: 2590 cells/uL (ref 1500–7800)
Platelets: 257 10*3/uL (ref 140–400)
RBC: 5.24 MIL/uL — AB (ref 3.80–5.10)
RDW: 14.4 % (ref 11.0–15.0)
WBC: 7 10*3/uL (ref 3.8–10.8)

## 2016-04-18 MED ORDER — CLINDAMYCIN PHOSPHATE 1 % EX GEL: Freq: Two times a day (BID) | CUTANEOUS | 0 refills | Status: DC

## 2016-04-18 NOTE — Progress Notes (Signed)
Assessment and Plan:  Hypertension:  -Continue medication,  -monitor blood pressure at home.  -Continue DASH diet.   -Reminder to go to the ER if any CP, SOB, nausea, dizziness, severe HA, changes vision/speech, left arm numbness and tingling, and jaw pain.  Cholesterol: -Continue diet and exercise.  -Check cholesterol.  -cont medication  Type 2 diabetes with chronic kidney disease: -A1c has been well controlled on current routine -cholesterol is at goal -is doing feet checks -is on an  -Continue diet and exercise.  -Check A1C  Vitamin D Def: -continue medications.   Morbid obesity -discussed diet and exercise today and how it will help both BMI and also diabetes -patient reports that she will try to work on it -can refer to nutrition if she still has difficulty  Iron Def anemia -cont irons supplement -check Hgb today  Fatty liver -will benefit from weight loss -LFTs  Continue diet and meds as discussed. Further disposition pending results of labs.  HPI 51 y.o. female  presents for 3 month follow up with hypertension, hyperlipidemia, prediabetes and vitamin D.   Her blood pressure has been controlled at home, today their BP is BP: (!) 140/98.   She does not workout. She denies chest pain, shortness of breath, dizziness.  She is generally running around 120/70s.  She reports that she ran out last night and is having to go pick it up today. She has been doing well with BP at home.     She is on cholesterol medication and denies myalgias. Her cholesterol is not at goal. The cholesterol last visit was:   Lab Results  Component Value Date   CHOL 195 11/30/2015   HDL 38 (L) 11/30/2015   LDLCALC 82 11/30/2015   TRIG 376 (H) 11/30/2015   CHOLHDL 5.1 (H) 11/30/2015     She has been working on diet and exercise for prediabetes, and denies foot ulcerations, hyperglycemia, hypoglycemia , increased appetite, nausea, paresthesia of the feet, polydipsia, polyuria, visual  disturbances, vomiting and weight loss. Last A1C in the office was:  Lab Results  Component Value Date   HGBA1C 7.0 (H) 11/30/2015  She reports that she has been doing well with blood sugars. She is generally between 100-150.  She is not getting readings less than 70.  She is never having hypoglycemia.    Patient is on Vitamin D supplement.  Lab Results  Component Value Date   VD25OH 35 07/20/2015     She continues to have stress and worry about her mother.  She reports that the insurance company promised her home health would be able to do more than they actually can.  She is still unable to convince her to go to a nursing home.   Current Medications:  Current Outpatient Prescriptions on File Prior to Visit  Medication Sig Dispense Refill  . canagliflozin (INVOKANA) 300 MG TABS tablet TAKE 1 TABLET BEFORE BREAKFAST. 90 tablet 1  . Cholecalciferol (VITAMIN D-3) 5000 UNITS TABS Take by mouth daily.    . Coenzyme Q10 (COQ10 PO) Take by mouth daily.    Marland Kitchen glucose blood test strip Use as instructed 100 each 12  . glucose monitoring kit (FREESTYLE) monitoring kit 1 each by Does not apply route as needed for other. Use as directed 1 each 0  . hyoscyamine (LEVSIN, ANASPAZ) 0.125 MG tablet Take 1 tablet (0.125 mg total) by mouth every 6 (six) hours as needed. 60 tablet 3  . JANUVIA 100 MG tablet TAKE 1 TABLET DAILY.  30 tablet 0  . lisinopril (PRINIVIL,ZESTRIL) 20 MG tablet Take 1 tablet (20 mg total) by mouth daily. 90 tablet 0  . lisinopril-hydrochlorothiazide (PRINZIDE,ZESTORETIC) 20-12.5 MG tablet Take 1 tablet by mouth daily. 90 tablet 1  . Magnesium 500 MG CAPS Take 1 capsule by mouth daily. Taking 961m daily    . metFORMIN (GLUCOPHAGE-XR) 500 MG 24 hr tablet TAKE (2) TABLETS TWICE DAILY. 120 tablet 0  . omeprazole (PRILOSEC) 20 MG capsule TAKE 1 CAPSULE EVERY DAY. 30 capsule 0  . pravastatin (PRAVACHOL) 40 MG tablet TAKE 1 TABLET EACH DAY. 90 tablet 1   No current facility-administered  medications on file prior to visit.     Medical History:  Past Medical History:  Diagnosis Date  . Colitis 07/11/2012  . Colon polyps 07/11/2012   TUBULOVILLOUS ADENOMA  . Diabetes mellitus   . Exercise-induced asthma   . Hernia   . Hyperlipidemia   . Hypertension   . Iron deficiency   . Kidney stones   . Obesity   . OSA on CPAP   . Vitamin D deficiency     Allergies:  Allergies  Allergen Reactions  . Welchol [Colesevelam Hcl]     HIves     Review of Systems:  Review of Systems  Constitutional: Negative for chills, fever and malaise/fatigue.  HENT: Negative for congestion, ear pain and sore throat.   Eyes: Negative.   Respiratory: Negative for cough, shortness of breath and wheezing.   Cardiovascular: Negative for chest pain, palpitations and leg swelling.  Gastrointestinal: Negative for abdominal pain, blood in stool, constipation, diarrhea, heartburn and melena.  Genitourinary: Negative.   Skin: Negative.   Neurological: Negative for dizziness, sensory change, loss of consciousness and headaches.  Psychiatric/Behavioral: Negative for depression. The patient is not nervous/anxious and does not have insomnia.     Family history- Review and unchanged  Social history- Review and unchanged  Physical Exam: BP (!) 140/98   Pulse 82   Temp 98 F (36.7 C) (Temporal)   Resp 18   Ht 5' 4.5" (1.638 m)   Wt 236 lb (107 kg)   BMI 39.88 kg/m  Wt Readings from Last 3 Encounters:  04/18/16 236 lb (107 kg)  11/30/15 237 lb 6.4 oz (107.7 kg)  07/20/15 234 lb (106.1 kg)    General Appearance: Morbidly obese, Well nourished well developed, in no apparent distress. Eyes: PERRLA, EOMs, conjunctiva no swelling or erythema ENT/Mouth: Ear canals normal without obstruction, swelling, erythma, discharge.  TMs normal bilaterally.  Oropharynx moist, clear, without exudate, or postoropharyngeal swelling. Neck: Supple, thyroid normal,no cervical adenopathy  Respiratory: Respiratory  effort normal, Breath sounds clear A&P without rhonchi, wheeze, or rale.  No retractions, no accessory usage. Cardio: RRR with no MRGs. Brisk peripheral pulses without edema.  Abdomen: Soft, + BS,  Non tender, no guarding, rebound, hernias, masses. Musculoskeletal: Full ROM, 5/5 strength, Normal gait Skin: Warm, dry without rashes, lesions, ecchymosis.  Neuro: Awake and oriented X 3, Cranial nerves intact. Normal muscle tone, no cerebellar symptoms. Psych: Normal affect, Insight and Judgment appropriate.    CStarlyn Skeans PA-C 4:46 PM [facility]

## 2016-04-19 LAB — BASIC METABOLIC PANEL WITH GFR
BUN: 17 mg/dL (ref 7–25)
CALCIUM: 9.5 mg/dL (ref 8.6–10.4)
CO2: 26 mmol/L (ref 20–31)
Chloride: 101 mmol/L (ref 98–110)
Creat: 0.89 mg/dL (ref 0.50–1.05)
GFR, EST AFRICAN AMERICAN: 87 mL/min (ref >=60)
GFR, EST NON AFRICAN AMERICAN: 76 mL/min (ref >=60)
GLUCOSE: 126 mg/dL — AB (ref 65–99)
POTASSIUM: 3.9 mmol/L (ref 3.5–5.3)
SODIUM: 139 mmol/L (ref 135–146)

## 2016-04-19 LAB — LIPID PANEL
Cholesterol: 179 mg/dL (ref <200)
HDL: 33 mg/dL — AB (ref >50)
LDL CALC: 77 mg/dL (ref <100)
TRIGLYCERIDES: 345 mg/dL — AB (ref <150)
Total CHOL/HDL Ratio: 5.4 Ratio — ABNORMAL HIGH (ref <5.0)
VLDL: 69 mg/dL — AB (ref <30)

## 2016-04-19 LAB — HEPATIC FUNCTION PANEL
ALK PHOS: 46 U/L (ref 33–130)
ALT: 85 U/L — AB (ref 6–29)
AST: 50 U/L — ABNORMAL HIGH (ref 10–35)
Albumin: 4.5 g/dL (ref 3.6–5.1)
BILIRUBIN DIRECT: 0.1 mg/dL (ref <=0.2)
BILIRUBIN INDIRECT: 0.3 mg/dL (ref 0.2–1.2)
TOTAL PROTEIN: 7.5 g/dL (ref 6.1–8.1)
Total Bilirubin: 0.4 mg/dL (ref 0.2–1.2)

## 2016-04-19 LAB — TSH: TSH: 2.03 mIU/L

## 2016-04-19 LAB — HEMOGLOBIN A1C
HEMOGLOBIN A1C: 7.2 % — AB (ref <5.7)
Mean Plasma Glucose: 160 mg/dL

## 2016-04-23 ENCOUNTER — Other Ambulatory Visit: Payer: Self-pay | Admitting: Internal Medicine

## 2016-04-30 ENCOUNTER — Other Ambulatory Visit: Payer: Self-pay | Admitting: Internal Medicine

## 2016-05-22 ENCOUNTER — Other Ambulatory Visit: Payer: Self-pay | Admitting: Internal Medicine

## 2016-07-05 ENCOUNTER — Encounter: Payer: Self-pay | Admitting: Internal Medicine

## 2016-07-17 ENCOUNTER — Other Ambulatory Visit: Payer: Self-pay | Admitting: Internal Medicine

## 2016-07-20 ENCOUNTER — Encounter: Payer: Self-pay | Admitting: Internal Medicine

## 2016-09-16 ENCOUNTER — Other Ambulatory Visit: Payer: Self-pay | Admitting: Internal Medicine

## 2016-10-05 NOTE — Progress Notes (Signed)
Complete Physical  Assessment and Plan:  Encounter for general adult medical examination with abnormal findings - CBC with Differential/Platelet - BASIC METABOLIC PANEL WITH GFR - Hepatic function panel - Magnesium  Essential hypertension - continue medications, DASH diet, exercise and monitor at home. Call if greater than 130/80.  - Urinalysis, Routine w reflex microscopic (not at [facility]) - Microalbumin / creatinine urine ratio - EKG 12-Lead - TSH  Type 2 diabetes mellitus with other diabetic kidney complication, without long-term current use of insulin ([place]) Discussed general issues about diabetes pathophysiology and management., Educational material distributed., Suggested low cholesterol diet., Encouraged aerobic exercise., Discussed foot care., Reminded to get yearly retinal exam. - Hemoglobin A1c   Iron deficiency anemia, unspecified - Iron and TIBC - Vitamin B12  Fatty liver Check labs, avoid tylenol, alcohol, weight loss advised.   Transaminitis -due to fatty liver   Obesity -diet and exercise  Hyperlipidemia -cont pravastatin - Lipid panel  Vitamin D deficiency -cont supplement - VITAMIN D 25 Hydroxy (Vit-D Deficiency, Fractures)  Ventral hernia Present again, some pain/discomfort with it Explained to patient that without weight loss will likely continue to reoccur Will follow up with surgeon  Discussed med's effects and SE's. Screening labs and tests as requested with regular follow-up as recommended.  HPI  51 y.o. female  presents for a complete physical.  Her blood pressure has been controlled at home, today their BP is BP: 138/88.  She does not workout. She denies chest pain, shortness of breath, dizziness.  She is taking care of her mom and this very stressful.  She has had ventral hernia repair x 3 times, last time 2017, feels that it is back, will have pain, bulging, occ will not want ot eat or nausea with it.  She is on cholesterol medication and  denies myalgias. Her cholesterol is not at goal. The cholesterol last visit was:  Lab Results  Component Value Date   CHOL 179 04/18/2016   HDL 33 (L) 04/18/2016   LDLCALC 77 04/18/2016   TRIG 345 (H) 04/18/2016   CHOLHDL 5.4 (H) 04/18/2016  . She has been working on diet and exercise for diabetes with CKD, she is not on bASA, she is on ACE/ARB and denies foot ulcerations, hyperglycemia, hypoglycemia , increased appetite, nausea, paresthesia of the feet, polydipsia, polyuria, visual disturbances, vomiting and weight loss. Last A1C in the office was:  Lab Results  Component Value Date   HGBA1C 7.2 (H) 04/18/2016    Lab Results  Component Value Date   GFRNONAA 76 04/18/2016   Patient is on Vitamin D supplement.   Lab Results  Component Value Date   VD25OH 35 07/20/2015     BMI is Body mass index is 38.36 kg/m., she is working on diet and exercise. Wt Readings from Last 3 Encounters:  10/06/16 227 lb (103 kg)  04/18/16 236 lb (107 kg)  11/30/15 237 lb 6.4 oz (107.7 kg)     Current Medications:  Current Outpatient Prescriptions on File Prior to Visit  Medication Sig Dispense Refill  . Cholecalciferol (VITAMIN D-3) 5000 UNITS TABS Take by mouth daily.    . Coenzyme Q10 (COQ10 PO) Take by mouth daily.    Marland Kitchen glucose blood test strip Use as instructed 100 each 12  . glucose monitoring kit (FREESTYLE) monitoring kit 1 each by Does not apply route as needed for other. Use as directed 1 each 0  . hyoscyamine (LEVSIN, ANASPAZ) 0.125 MG tablet Take 1 tablet (0.125 mg total)  by mouth every 6 (six) hours as needed. 60 tablet 3  . INVOKANA 300 MG TABS tablet TAKE 1 TABLET BEFORE BREAKFAST. 90 tablet 1  . JANUVIA 100 MG tablet TAKE 1 TABLET DAILY. 90 tablet 1  . lisinopril (PRINIVIL,ZESTRIL) 20 MG tablet TAKE 1 TABLET BY MOUTH DAILY 90 tablet 1  . lisinopril-hydrochlorothiazide (PRINZIDE,ZESTORETIC) 20-12.5 MG tablet TAKE 1 TABLET DAILY. 30 tablet 0  . Magnesium 500 MG CAPS Take 1 capsule  by mouth daily. Taking 964m daily    . metFORMIN (GLUCOPHAGE-XR) 500 MG 24 hr tablet TAKE (2) TABLETS TWICE DAILY. 360 tablet 1  . omeprazole (PRILOSEC) 20 MG capsule TAKE 1 CAPSULE EVERY DAY. 90 capsule 1  . pravastatin (PRAVACHOL) 40 MG tablet TAKE 1 TABLET DAILY. 90 tablet 1   No current facility-administered medications on file prior to visit.     Health Maintenance:   Immunization History  Administered Date(s) Administered  . Influenza Whole 12/19/2008  . PPD Test 05/23/2013  . Td 03/21/2006   Tetanus: 2008 DUE declines until next OV Flu vaccine: 2016 LJYN:WGNFAOpost ablation, no further periods.   Pap: 2017 MGM: 2017 DUE DEXA: Not due Colonoscopy: 2014 DUE EGD: 2014 Last Dental Ex[facility]    Patient Care Team: MUnk Pinto MD as PCP - General (Internal Medicine) SLadene Artist MD as Consulting Physician (Gastroenterology) RRemer Macho MD as Attending Physician (Ophthalmology) ALenore Cordia MD (Obstetrics and Gynecology) RFrederik Pear MD as Consulting Physician (Orthopedic Surgery) WRolan Bucco MD as Consulting Physician (Urology) FSherlyn Lick MD (Dermatology)   Medical History:  Past Medical History:  Diagnosis Date  . Colitis 07/11/2012  . Colon polyps 07/11/2012   TUBULOVILLOUS ADENOMA  . Diabetes mellitus   . Exercise-induced asthma   . Hernia   . Hyperlipidemia   . Hypertension   . Iron deficiency   . Kidney stones   . Obesity   . OSA on CPAP   . Vitamin D deficiency    Allergies Allergies  Allergen Reactions  . Welchol [Colesevelam Hcl]     HIves    SURGICAL HISTORY She  has a past surgical history that includes Tonsilectomy, adenoidectomy, bilateral myringotomy and tubes; Ablation; Abdominoplasty; Esophagogastroduodenoscopy (07/11/2012); and Colonoscopy w/ biopsies (07/11/2012). FAMILY HISTORY Her family history is not on file. She was adopted. SOCIAL HISTORY She  reports  that she quit smoking about 31 years ago. She has a 2.00 pack-year smoking history. She has never used smokeless tobacco. She reports that she does not drink alcohol or use drugs.  Review of Systems: Review of Systems  Constitutional: Negative for chills, fever and malaise/fatigue.  HENT: Negative for congestion, ear pain and sore throat.   Eyes: Negative.   Respiratory: Negative for cough, shortness of breath and wheezing.   Cardiovascular: Negative for chest pain, palpitations and leg swelling.  Gastrointestinal: Negative for abdominal pain, blood in stool, constipation, diarrhea, heartburn and melena.  Genitourinary: Negative.   Skin: Negative.   Neurological: Negative for dizziness, sensory change, loss of consciousness and headaches.  Psychiatric/Behavioral: Negative for depression. The patient is not nervous/anxious and does not have insomnia.     Physical Exam: Estimated body mass index is 38.36 kg/m as calculated from the following:   Height as of this encounter: 5' 4.5" (1.638 m).   Weight as of this encounter: 227 lb (103 kg). BP 138/88   Pulse 86   Temp 97.7 F (36.5 C)   Resp 16  Ht 5' 4.5" (1.638 m)   Wt 227 lb (103 kg)   SpO2 96%   BMI 38.36 kg/m   General Appearance: Well nourished well developed, in no apparent distress.  Eyes: PERRLA, EOMs, conjunctiva no swelling or erythema ENT/Mouth: Ear canals normal without obstruction, swelling, erythema, or discharge.  TMs normal bilaterally with no erythema, bulging, retraction, or loss of landmark.  Oropharynx moist and clear with no exudate, erythema, or swelling.   Neck: Supple, thyroid normal. No bruits.  No cervical adenopathy Respiratory: Respiratory effort normal, Breath sounds clear A&P without wheeze, rhonchi, rales.   Cardio: RRR without murmurs, rubs or gallops. Brisk peripheral pulses without edema.  Chest: symmetric, with normal excursions Breasts: Deferred to obgyn Abdomen: Soft, nontender, no guarding,  rebound, mild bulging superior of periumbilical region without defined hernia noted on exam.  Exam difficult secondary to body habitus, masses, or organomegaly.  Lymphatics: Non tender without lymphadenopathy.  Genitourinary: Deferred to Obgyn Musculoskeletal: Full ROM all peripheral extremities,5/5 strength, and normal gait.  Skin: Warm, dry without rashes, lesions, ecchymosis. Neuro: Awake and oriented X 3, Cranial nerves intact, reflexes equal bilaterally. Normal muscle tone, no cerebellar symptoms. Sensation intact.  Psych:  normal affect, Insight and Judgment appropriate.   EKG: WNL no changes.  AORTA SCAN: defer  Over 40 minutes of exam, counseling, chart review and critical decision making was performed  Vicie Mutters 4:30 PM [facility]

## 2016-10-06 ENCOUNTER — Encounter: Payer: Self-pay | Admitting: Physician Assistant

## 2016-10-06 ENCOUNTER — Ambulatory Visit (INDEPENDENT_AMBULATORY_CARE_PROVIDER_SITE_OTHER): Payer: Managed Care, Other (non HMO) | Admitting: Physician Assistant

## 2016-10-06 VITALS — BP 138/88 | HR 86 | Temp 97.7°F | Resp 16 | Ht 64.5 in | Wt 227.0 lb

## 2016-10-06 DIAGNOSIS — Z0001 Encounter for general adult medical examination with abnormal findings: Secondary | ICD-10-CM

## 2016-10-06 DIAGNOSIS — Z79899 Other long term (current) drug therapy: Secondary | ICD-10-CM

## 2016-10-06 DIAGNOSIS — Z6837 Body mass index (BMI) 37.0-37.9, adult: Secondary | ICD-10-CM | POA: Diagnosis not present

## 2016-10-06 DIAGNOSIS — E785 Hyperlipidemia, unspecified: Secondary | ICD-10-CM | POA: Diagnosis not present

## 2016-10-06 DIAGNOSIS — I10 Essential (primary) hypertension: Secondary | ICD-10-CM

## 2016-10-06 DIAGNOSIS — G4733 Obstructive sleep apnea (adult) (pediatric): Secondary | ICD-10-CM

## 2016-10-06 DIAGNOSIS — R7401 Elevation of levels of liver transaminase levels: Secondary | ICD-10-CM

## 2016-10-06 DIAGNOSIS — R740 Nonspecific elevation of levels of transaminase and lactic acid dehydrogenase [LDH]: Secondary | ICD-10-CM

## 2016-10-06 DIAGNOSIS — D509 Iron deficiency anemia, unspecified: Secondary | ICD-10-CM | POA: Diagnosis not present

## 2016-10-06 DIAGNOSIS — E1129 Type 2 diabetes mellitus with other diabetic kidney complication: Secondary | ICD-10-CM

## 2016-10-06 DIAGNOSIS — K760 Fatty (change of) liver, not elsewhere classified: Secondary | ICD-10-CM

## 2016-10-06 LAB — CBC WITH DIFFERENTIAL/PLATELET
BASOS ABS: 0 {cells}/uL (ref 0–200)
Basophils Relative: 0 %
EOS PCT: 2 %
Eosinophils Absolute: 156 cells/uL (ref 15–500)
HCT: 45.8 % — ABNORMAL HIGH (ref 35.0–45.0)
Hemoglobin: 15.1 g/dL (ref 11.7–15.5)
Lymphocytes Relative: 48 %
Lymphs Abs: 3744 cells/uL (ref 850–3900)
MCH: 28.4 pg (ref 27.0–33.0)
MCHC: 33 g/dL (ref 32.0–36.0)
MCV: 86.3 fL (ref 80.0–100.0)
MONOS PCT: 6 %
MPV: 9.6 fL (ref 7.5–12.5)
Monocytes Absolute: 468 cells/uL (ref 200–950)
NEUTROS PCT: 44 %
Neutro Abs: 3432 cells/uL (ref 1500–7800)
PLATELETS: 320 10*3/uL (ref 140–400)
RBC: 5.31 MIL/uL — AB (ref 3.80–5.10)
RDW: 14.8 % (ref 11.0–15.0)
WBC: 7.8 10*3/uL (ref 3.8–10.8)

## 2016-10-06 NOTE — Patient Instructions (Addendum)
Drink 80-100 oz a day of water, measure it out Eat 3 meals a day, have to do breakfast, eat protein-  protein bar like nature valley protein bar, greek yogurt like oikos triple zero, chobani 100, or light n fit greek  Follow up Psychologist, sport and exercise.    Simple math prevails.    1st - exercise does not produce significant weight loss - at best one converts fat into muscle , "bulks up", loses inches, but usually stays "weight neutral"     2nd - think of your body weightas a check book: If you eat more calories than you burn up - you save money or gain weight .... Or if you spend more money than you put in the check book, ie burn up more calories than you eat, then you lose weight     3rd - if you walk or run 1 mile, you burn up 100 calories - you have to burn up 3,500 calories to lose 1 pound, ie you have to walk/run 35 miles to lose 1 measly pound. So if you want to lose 10 #, then you have to walk/run 350 miles, so.... clearly exercise is not the solution.     4. So if you consume 1,500 calories, then you have to burn up the equivalent of 15 miles to stay weight neutral - It also stands to reason that if you consume 1,500 cal/day and don't lose weight, then you must be burning up about 1,500 cals/day to stay weight neutral.     5. If you really want to lose weight, you must cut your calorie intake 300 calories /day and at that rate you should lose about 1 # every 3 days.   6. Please purchase Dr Fara Olden Fuhrman's book(s) "The End of Dieting" & "Eat to Live" . It has some great concepts and recipes.        Bad carbs also include fruit juice, alcohol, and sweet tea. These are empty calories that do not signal to your brain that you are full.   Please remember the good carbs are still carbs which convert into sugar. So please measure them out no more than 1/2-1 cup of rice, oatmeal, pasta, and beans  Veggies are however free foods! Pile them on.   Not all fruit is created equal. Please see the list below,  the fruit at the bottom is higher in sugars than the fruit at the top. Please avoid all dried fruits.

## 2016-10-07 ENCOUNTER — Encounter: Payer: Self-pay | Admitting: Physician Assistant

## 2016-10-07 LAB — BASIC METABOLIC PANEL WITH GFR
BUN: 13 mg/dL (ref 7–25)
CHLORIDE: 99 mmol/L (ref 98–110)
CO2: 22 mmol/L (ref 20–31)
Calcium: 10.1 mg/dL (ref 8.6–10.4)
Creat: 1.03 mg/dL (ref 0.50–1.05)
GFR, EST NON AFRICAN AMERICAN: 64 mL/min (ref >=60)
GFR, Est African American: 73 mL/min (ref >=60)
GLUCOSE: 248 mg/dL — AB (ref 65–99)
POTASSIUM: 4.7 mmol/L (ref 3.5–5.3)
SODIUM: 140 mmol/L (ref 135–146)

## 2016-10-07 LAB — LIPID PANEL
Cholesterol: 212 mg/dL — ABNORMAL HIGH (ref <200)
HDL: 42 mg/dL — ABNORMAL LOW (ref >50)
LDL CALC: 105 mg/dL — AB (ref <100)
Total CHOL/HDL Ratio: 5 Ratio — ABNORMAL HIGH (ref <5.0)
Triglycerides: 325 mg/dL — ABNORMAL HIGH (ref <150)
VLDL: 65 mg/dL — AB (ref <30)

## 2016-10-07 LAB — IRON AND TIBC
%SAT: 15 % (ref 11–50)
Iron: 62 ug/dL (ref 45–160)
TIBC: 403 ug/dL (ref 250–450)
UIBC: 341 ug/dL

## 2016-10-07 LAB — URINALYSIS, MICROSCOPIC ONLY
BACTERIA UA: NONE SEEN [HPF]
CASTS: NONE SEEN [LPF]
Crystals: NONE SEEN [HPF]
RBC / HPF: NONE SEEN RBC/HPF (ref <=2)

## 2016-10-07 LAB — HEMOGLOBIN A1C
HEMOGLOBIN A1C: 8.1 % — AB (ref <5.7)
Mean Plasma Glucose: 186 mg/dL

## 2016-10-07 LAB — URINALYSIS, ROUTINE W REFLEX MICROSCOPIC
BILIRUBIN URINE: NEGATIVE
Hgb urine dipstick: NEGATIVE
KETONES UR: NEGATIVE
Leukocytes, UA: NEGATIVE
Nitrite: NEGATIVE
PROTEIN: NEGATIVE
SPECIFIC GRAVITY, URINE: 1.04 — AB (ref 1.001–1.035)
pH: 5.5 (ref 5.0–8.0)

## 2016-10-07 LAB — MAGNESIUM: MAGNESIUM: 2.2 mg/dL (ref 1.5–2.5)

## 2016-10-07 LAB — TSH: TSH: 1.74 mIU/L

## 2016-10-07 LAB — HEPATIC FUNCTION PANEL
ALBUMIN: 4.3 g/dL (ref 3.6–5.1)
ALK PHOS: 54 U/L (ref 33–130)
ALT: 113 U/L — ABNORMAL HIGH (ref 6–29)
AST: 74 U/L — AB (ref 10–35)
BILIRUBIN INDIRECT: 0.4 mg/dL (ref 0.2–1.2)
Bilirubin, Direct: 0.1 mg/dL (ref <=0.2)
TOTAL PROTEIN: 7.3 g/dL (ref 6.1–8.1)
Total Bilirubin: 0.5 mg/dL (ref 0.2–1.2)

## 2016-10-07 LAB — MICROALBUMIN / CREATININE URINE RATIO
CREATININE, URINE: 50 mg/dL (ref 20–320)
Microalb, Ur: <0.2 mg/dL

## 2016-10-07 LAB — VITAMIN B12: Vitamin B-12: 354 pg/mL (ref 200–1100)

## 2016-10-17 ENCOUNTER — Other Ambulatory Visit: Payer: Self-pay | Admitting: Physician Assistant

## 2016-10-23 ENCOUNTER — Other Ambulatory Visit: Payer: Self-pay | Admitting: Internal Medicine

## 2016-11-20 ENCOUNTER — Other Ambulatory Visit: Payer: Self-pay | Admitting: Internal Medicine

## 2016-12-10 ENCOUNTER — Other Ambulatory Visit: Payer: Self-pay | Admitting: Internal Medicine

## 2016-12-29 ENCOUNTER — Encounter: Payer: Self-pay | Admitting: Physician Assistant

## 2017-01-02 MED ORDER — FREESTYLE SYSTEM KIT: 1.0000 | PACK | 0 refills | Status: DC | PRN

## 2017-01-07 ENCOUNTER — Other Ambulatory Visit: Payer: Self-pay | Admitting: Internal Medicine

## 2017-01-16 ENCOUNTER — Other Ambulatory Visit: Payer: Self-pay | Admitting: Internal Medicine

## 2017-01-31 ENCOUNTER — Ambulatory Visit: Payer: Self-pay | Admitting: Physician Assistant

## 2017-02-07 ENCOUNTER — Other Ambulatory Visit: Payer: Self-pay | Admitting: Internal Medicine

## 2017-02-07 ENCOUNTER — Other Ambulatory Visit: Payer: Self-pay | Admitting: Physician Assistant

## 2017-03-10 ENCOUNTER — Other Ambulatory Visit: Payer: Self-pay | Admitting: Physician Assistant

## 2017-03-15 ENCOUNTER — Ambulatory Visit: Payer: Self-pay | Admitting: Physician Assistant

## 2017-03-19 ENCOUNTER — Other Ambulatory Visit: Payer: Self-pay | Admitting: Adult Health

## 2017-03-20 ENCOUNTER — Other Ambulatory Visit: Payer: Self-pay | Admitting: Physician Assistant

## 2017-03-20 MED ORDER — SITAGLIPTIN PHOSPHATE 100 MG PO TABS: 100.0000 mg | ORAL_TABLET | Freq: Every day | ORAL | 0 refills | Status: DC

## 2017-03-20 MED ORDER — PRAVASTATIN SODIUM 40 MG PO TABS: 40.0000 mg | ORAL_TABLET | Freq: Every day | ORAL | 0 refills | Status: DC

## 2017-03-20 MED ORDER — METFORMIN HCL ER 500 MG PO TB24: 1000.0000 mg | ORAL_TABLET | Freq: Two times a day (BID) | ORAL | 0 refills | Status: DC

## 2017-03-20 MED ORDER — METFORMIN HCL ER 500 MG PO TB24
500.0000 mg | ORAL_TABLET | Freq: Every day | ORAL | 0 refills | Status: DC
Start: 2017-03-20 — End: 2017-03-20

## 2017-03-20 MED ORDER — LISINOPRIL-HYDROCHLOROTHIAZIDE 20-12.5 MG PO TABS: 1.0000 | ORAL_TABLET | Freq: Every day | ORAL | 0 refills | Status: DC

## 2017-03-20 MED ORDER — OMEPRAZOLE 20 MG PO CPDR: 20.0000 mg | DELAYED_RELEASE_CAPSULE | Freq: Every day | ORAL | 0 refills | Status: DC

## 2017-03-20 MED ORDER — DAPAGLIFLOZIN PROPANEDIOL 10 MG PO TABS: 10.0000 mg | ORAL_TABLET | Freq: Every day | ORAL | 0 refills | Status: DC

## 2017-03-20 NOTE — Progress Notes (Signed)
Future Appointments  Date Time Provider [place]  10/11/2017  3:00 PM Vicie Mutters, PA-C [id number] None

## 2017-03-23 ENCOUNTER — Encounter: Payer: Self-pay | Admitting: Gastroenterology

## 2017-03-30 ENCOUNTER — Ambulatory Visit: Payer: Managed Care, Other (non HMO) | Admitting: Physician Assistant

## 2017-03-30 ENCOUNTER — Encounter: Payer: Self-pay | Admitting: Physician Assistant

## 2017-03-30 VITALS — BP 130/80 | HR 84 | Temp 97.5°F | Resp 16 | Ht 64.5 in | Wt 223.0 lb

## 2017-03-30 DIAGNOSIS — K760 Fatty (change of) liver, not elsewhere classified: Secondary | ICD-10-CM

## 2017-03-30 DIAGNOSIS — E1129 Type 2 diabetes mellitus with other diabetic kidney complication: Secondary | ICD-10-CM | POA: Diagnosis not present

## 2017-03-30 DIAGNOSIS — Z79899 Other long term (current) drug therapy: Secondary | ICD-10-CM

## 2017-03-30 DIAGNOSIS — D509 Iron deficiency anemia, unspecified: Secondary | ICD-10-CM

## 2017-03-30 DIAGNOSIS — I10 Essential (primary) hypertension: Secondary | ICD-10-CM

## 2017-03-30 DIAGNOSIS — E782 Mixed hyperlipidemia: Secondary | ICD-10-CM

## 2017-03-30 DIAGNOSIS — E66812 Obesity, class 2: Secondary | ICD-10-CM

## 2017-03-30 DIAGNOSIS — Z6837 Body mass index (BMI) 37.0-37.9, adult: Secondary | ICD-10-CM

## 2017-03-30 NOTE — Patient Instructions (Addendum)
Add ENTERIC COATED low dose 81 mg Aspirin daily OR can do every other day if you have easy bruising to protect your heart and head. As well as to reduce risk of Colon Cancer by 20 %, Skin Cancer by 26 % , Melanoma by 46% and Pancreatic cancer by 60%  Being dehydrated can hurt your kidneys, cause fatigue, headaches, muscle aches, joint pain, and dry skin/nails so please increase your fluids.   Drink 80-100 oz a day of water, measure it out! Eat 3 meals a day, have to do breakfast, eat protein- hard boiled eggs, protein bar like nature valley protein bar, greek yogurt like oikos triple zero, chobani 100, or light n fit greek  Can check out plantnanny app on your phone to help you keep track of your water  BRING IN FOOD LOG  Check out vionic shoes  Diabetes is a very complicated disease...lets simplify it.  An easy way to look at it to understand the complications is if you think of the extra sugar floating in your blood stream as glass shards floating through your blood stream.    Diabetes affects your small vessels first: 1) The glass shards (sugar) scraps down the tiny blood vessels in your eyes and lead to diabetic retinopathy, the leading cause of blindness in the [place]. Diabetes is the leading cause of newly diagnosed adult (54 to 52 years of age) blindness in the [place].  2) The glass shards scratches down the tiny vessels of your legs leading to nerve damage called neuropathy and can lead to amputations of your feet. More than 60% of all non-traumatic amputations of lower limbs occur in people with diabetes.  3) Over time the small vessels in your brain are shredded and closed off, individually this does not cause any problems but over a long period of time many of the small vessels being blocked can lead to Vascular Dementia.   4) Your kidney's are a filter system and have a "net" that keeps certain things in the body and lets bad things out. Sugar shreds this net and leads to  kidney damage and eventually failure. Decreasing the sugar that is destroying the net and certain blood pressure medications can help stop or decrease progression of kidney disease. Diabetes was the primary cause of kidney failure in 44 percent of all new cases in 2011.  5) Diabetes also destroys the small vessels in your penis that lead to erectile dysfunction. Eventually the vessels are so damaged that you may not be responsive to cialis or viagra.   Diabetes and your large vessels: Your larger vessels consist of your coronary arteries in your heart and the carotid vessels to your brain. Diabetes or even increased sugars put you at 300% increased risk of heart attack and stroke and this is why.. The sugar scrapes down your large blood vessels and your body sees this as an internal injury and tries to repair itself. Just like you get a scab on your skin, your platelets will stick to the blood vessel wall trying to heal it. This is why we have diabetics on low dose aspirin daily, this prevents the platelets from sticking and can prevent plaque formation. In addition, your body takes cholesterol and tries to shove it into the open wound. This is why we want your LDL, or bad cholesterol, below 70.   The combination of platelets and cholesterol over 5-10 years forms plaque that can break off and cause a heart attack or stroke.  PLEASE REMEMBER:  Diabetes is preventable! Up to 16 percent of complications and morbidities among individuals with type 2 diabetes can be prevented, delayed, or effectively treated and minimized with regular visits to a health professional, appropriate monitoring and medication, and a healthy diet and lifestyle.      Bad carbs also include fruit juice, alcohol, and sweet tea. These are empty calories that do not signal to your brain that you are full.   Please remember the good carbs are still carbs which convert into sugar. So please measure them out no more than 1/2-1 cup  of rice, oatmeal, pasta, and beans  Veggies are however free foods! Pile them on.   Not all fruit is created equal. Please see the list below, the fruit at the bottom is higher in sugars than the fruit at the top. Please avoid all dried fruits.     Plantar Fasciitis Plantar fasciitis is a painful foot condition that affects the heel. It occurs when the band of tissue that connects the toes to the heel bone (plantar fascia) becomes irritated. This can happen after exercising too much or doing other repetitive activities (overuse injury). The pain from plantar fasciitis can range from mild irritation to severe pain that makes it difficult for you to walk or move. The pain is usually worse in the morning or after you have been sitting or lying down for a while. What are the causes? This condition may be caused by:  Standing for long periods of time.  Wearing shoes that do not fit.  Doing high-impact activities, including running, aerobics, and ballet.  Being overweight.  Having an abnormal way of walking (gait).  Having tight calf muscles.  Having high arches in your feet.  Starting a new athletic activity.  What are the signs or symptoms? The main symptom of this condition is heel pain. Other symptoms include:  Pain that gets worse after activity or exercise.  Pain that is worse in the morning or after resting.  Pain that goes away after you walk for a few minutes.  How is this diagnosed? This condition may be diagnosed based on your signs and symptoms. Your health care provider will also do a physical exam to check for:  A tender area on the bottom of your foot.  A high arch in your foot.  Pain when you move your foot.  Difficulty moving your foot.  You may also need to have imaging studies to confirm the diagnosis. These can include:  X-rays.  Ultrasound.  MRI.  How is this treated? Treatment for plantar fasciitis depends on the severity of the condition. Your  treatment may include:  Rest, ice, and over-the-counter pain medicines to manage your pain.  Exercises to stretch your calves and your plantar fascia.  A splint that holds your foot in a stretched, upward position while you sleep (night splint).  Physical therapy to relieve symptoms and prevent problems in the future.  Cortisone injections to relieve severe pain.  Extracorporeal shock wave therapy (ESWT) to stimulate damaged plantar fascia with electrical impulses. It is often used as a last resort before surgery.  Surgery, if other treatments have not worked after 12 months.  Follow these instructions at home:  Take medicines only as directed by your health care provider.  Avoid activities that cause pain.  Roll the bottom of your foot over a bag of ice or a bottle of cold water. Do this for 20 minutes, 3-4 times a day.  Perform  simple stretches as directed by your health care provider.  Try wearing athletic shoes with air-sole or gel-sole cushions or soft shoe inserts.  Wear a night splint while sleeping, if directed by your health care provider.  Keep all follow-up appointments with your health care provider. How is this prevented?  Do not perform exercises or activities that cause heel pain.  Consider finding low-impact activities if you continue to have problems.  Lose weight if you need to. The best way to prevent plantar fasciitis is to avoid the activities that aggravate your plantar fascia. Contact a health care provider if:  Your symptoms do not go away after treatment with home care measures.  Your pain gets worse.  Your pain affects your ability to move or do your daily activities. This information is not intended to replace advice given to you by your health care provider. Make sure you discuss any questions you have with your health care provider. Document Released: 11/30/2000 Document Revised: 08/10/2015 Document Reviewed: 01/15/2014 Elsevier Interactive  Patient Education  [employer].

## 2017-03-30 NOTE — Progress Notes (Signed)
Assessment and Plan:   Essential hypertension - continue medications, DASH diet, exercise and monitor at home. Call if greater than 130/80.  -     CBC with Differential/Platelet -     BASIC METABOLIC PANEL WITH GFR -     Hepatic function panel -     TSH  Fatty liver LONG discussion about cirrhosis and fatty liver -     Hepatic function panel  Type 2 diabetes mellitus with other diabetic kidney complication, without long-term current use of insulin ([place]) Discussed general issues about diabetes pathophysiology and management., Educational material distributed., Suggested low cholesterol diet., Encouraged aerobic exercise., Discussed foot care., Reminded to get yearly retinal exam. Will get eye exam Will continue farxiga until 1 month follow up Will bring food log -     Hemoglobin A1c  Class 2 severe obesity due to excess calories with serious comorbidity and body mass index (BMI) of 37.0 to 37.9 in adult [facility]) - follow up 1 months for progress monitoring - increase veggies, decrease carbs - long discussion about weight loss, diet, and exercise - bring food log  Mixed hyperlipidemia -continue medications, check lipids, decrease fatty foods, increase activity.  Not at goal of less than 70, recheck today but may need to increase dose -     Lipid panel  Medication management -     Magnesium  Continue diet and meds as discussed. Further disposition pending results of labs.  HPI 52 y.o. female  presents for 3 month follow up with hypertension, hyperlipidemia, diabetes and vitamin D. OVERDUE   Her blood pressure has been controlled at home, today their BP is BP: 130/80.   She does not workout. She denies chest pain, shortness of breath, dizziness.     She is on cholesterol medication, on pravastatin and denies myalgias. Her cholesterol is not at goal. The cholesterol last visit was:   Lab Results  Component Value Date   CHOL 212 (H) 10/06/2016   HDL 42 (L) 10/06/2016   LDLCALC  105 (H) 10/06/2016   TRIG 325 (H) 10/06/2016   CHOLHDL 5.0 (H) 10/06/2016    She has been working on diet and exercise for diabetes with CKD, she was on invokana but insurance was not covering, she wants to try jardiance, she is on MF and , and denies foot ulcerations, hyperglycemia, hypoglycemia , increased appetite, nausea, paresthesia of the feet, polydipsia, polyuria, visual disturbances, vomiting and weight loss. Last A1C in the office was:  Lab Results  Component Value Date   HGBA1C 8.1 (H) 10/06/2016   Lab Results  Component Value Date   GFRNONAA 64 10/06/2016   Patient is on Vitamin D supplement.  Lab Results  Component Value Date   VD25OH 35 07/20/2015     She continues to have stress and worry about her mother.  She reports that the insurance company promised her home health would be able to do more than they actually can.  She is still unable to convince her to go to a nursing home. BMI is Body mass index is 37.69 kg/m., she is working on diet and exercise. Wt Readings from Last 3 Encounters:  03/30/17 223 lb (101.2 kg)  10/06/16 227 lb (103 kg)  04/18/16 236 lb (107 kg)      Current Medications:  Current Outpatient Medications on File Prior to Visit  Medication Sig Dispense Refill  . Cholecalciferol (VITAMIN D-3) 5000 UNITS TABS Take by mouth daily.    . Coenzyme Q10 (COQ10 PO) Take  by mouth daily.    . dapagliflozin propanediol (FARXIGA) 10 MG TABS tablet Take 10 mg by mouth daily. 30 tablet 0  . glucose blood test strip Use as instructed 100 each 12  . glucose monitoring kit (FREESTYLE) monitoring kit 1 each by Does not apply route as needed for other. 1 each 0  . lisinopril (PRINIVIL,ZESTRIL) 20 MG tablet TAKE 1 TABLET BY MOUTH DAILY 90 tablet 1  . lisinopril-hydrochlorothiazide (PRINZIDE,ZESTORETIC) 20-12.5 MG tablet Take 1 tablet by mouth daily. 30 tablet 0  . Magnesium 500 MG CAPS Take 1 capsule by mouth daily. Taking 934m daily    . metFORMIN  (GLUCOPHAGE-XR) 500 MG 24 hr tablet Take 2 tablets (1,000 mg total) by mouth 2 (two) times daily before a meal. 120 tablet 0  . omeprazole (PRILOSEC) 20 MG capsule Take 1 capsule (20 mg total) by mouth daily. 30 capsule 0  . pravastatin (PRAVACHOL) 40 MG tablet Take 1 tablet (40 mg total) by mouth daily. 30 tablet 0  . sitaGLIPtin (JANUVIA) 100 MG tablet Take 1 tablet (100 mg total) by mouth daily. 30 tablet 0   No current facility-administered medications on file prior to visit.     Medical History:  Past Medical History:  Diagnosis Date  . Colitis 07/11/2012  . Colon polyps 07/11/2012   TUBULOVILLOUS ADENOMA  . Diabetes mellitus   . Exercise-induced asthma   . Hernia   . Hyperlipidemia   . Hypertension   . Iron deficiency   . Kidney stones   . Obesity   . OSA on CPAP   . Vitamin D deficiency     Allergies:  Allergies  Allergen Reactions  . Welchol [Colesevelam Hcl]     HIves     Review of Systems:  Review of Systems  Constitutional: Negative for chills, fever and malaise/fatigue.  HENT: Negative for congestion, ear pain and sore throat.   Eyes: Negative.   Respiratory: Negative for cough, shortness of breath and wheezing.   Cardiovascular: Negative for chest pain, palpitations and leg swelling.  Gastrointestinal: Negative for abdominal pain, blood in stool, constipation, diarrhea, heartburn and melena.  Genitourinary: Negative.   Skin: Negative.   Neurological: Negative for dizziness, sensory change, loss of consciousness and headaches.  Psychiatric/Behavioral: Negative for depression. The patient is not nervous/anxious and does not have insomnia.     Family history- Review and unchanged  Social history- Review and unchanged  Physical Exam: BP 130/80   Pulse 84   Temp (!) 97.5 F (36.4 C)   Resp 16   Ht 5' 4.5" (1.638 m)   Wt 223 lb (101.2 kg)   SpO2 97%   BMI 37.69 kg/m  Wt Readings from Last 3 Encounters:  03/30/17 223 lb (101.2 kg)  10/06/16 227 lb  (103 kg)  04/18/16 236 lb (107 kg)    General Appearance: Morbidly obese, Well nourished well developed, in no apparent distress. Eyes: PERRLA, EOMs, conjunctiva no swelling or erythema ENT/Mouth: Ear canals normal without obstruction, swelling, erythma, discharge.  TMs normal bilaterally.  Oropharynx moist, clear, without exudate, or postoropharyngeal swelling. Neck: Supple, thyroid normal,no cervical adenopathy  Respiratory: Respiratory effort normal, Breath sounds clear A&P without rhonchi, wheeze, or rale.  No retractions, no accessory usage. Cardio: RRR with no MRGs. Brisk peripheral pulses without edema.  Abdomen: Soft, + BS,  Non tender, no guarding, rebound, hernias, masses. Musculoskeletal: Full ROM, 5/5 strength, Normal gait Skin: Warm, dry without rashes, lesions, ecchymosis.  Neuro: Awake and oriented X 3, Cranial  nerves intact. Normal muscle tone, no cerebellar symptoms. Psych: Normal affect, Insight and Judgment appropriate.    Vicie Mutters, PA-C 9:02 AM [facility]

## 2017-03-31 ENCOUNTER — Other Ambulatory Visit: Payer: Self-pay | Admitting: Physician Assistant

## 2017-03-31 LAB — CBC WITH DIFFERENTIAL/PLATELET
BASOS ABS: 37 {cells}/uL (ref 0–200)
BASOS PCT: 0.6 %
Eosinophils Absolute: 143 cells/uL (ref 15–500)
Eosinophils Relative: 2.3 %
HCT: 44.3 % (ref 35.0–45.0)
HEMOGLOBIN: 14.9 g/dL (ref 11.7–15.5)
Lymphs Abs: 2275 cells/uL (ref 850–3900)
MCH: 27.4 pg (ref 27.0–33.0)
MCHC: 33.6 g/dL (ref 32.0–36.0)
MCV: 81.4 fL (ref 80.0–100.0)
MONOS PCT: 5.3 %
MPV: 9.7 fL (ref 7.5–12.5)
NEUTROS ABS: 3416 {cells}/uL (ref 1500–7800)
Neutrophils Relative %: 55.1 %
Platelets: 326 10*3/uL (ref 140–400)
RBC: 5.44 10*6/uL — ABNORMAL HIGH (ref 3.80–5.10)
RDW: 13.3 % (ref 11.0–15.0)
Total Lymphocyte: 36.7 %
WBC mixed population: 329 cells/uL (ref 200–950)
WBC: 6.2 10*3/uL (ref 3.8–10.8)

## 2017-03-31 LAB — LIPID PANEL
CHOL/HDL RATIO: 4.3 (calc) (ref <5.0)
Cholesterol: 191 mg/dL (ref <200)
HDL: 44 mg/dL — AB (ref >50)
LDL Cholesterol (Calc): 113 mg/dL (calc) — ABNORMAL HIGH
Non-HDL Cholesterol (Calc): 147 mg/dL (calc) — ABNORMAL HIGH (ref <130)
Triglycerides: 218 mg/dL — ABNORMAL HIGH (ref <150)

## 2017-03-31 LAB — TSH: TSH: 1.68 mIU/L

## 2017-03-31 LAB — HEPATIC FUNCTION PANEL
AG RATIO: 1.7 (calc) (ref 1.0–2.5)
ALKALINE PHOSPHATASE (APISO): 50 U/L (ref 33–130)
ALT: 60 U/L — AB (ref 6–29)
AST: 37 U/L — AB (ref 10–35)
Albumin: 4.5 g/dL (ref 3.6–5.1)
Bilirubin, Direct: 0.2 mg/dL (ref 0.0–0.2)
Globulin: 2.7 g/dL (calc) (ref 1.9–3.7)
Indirect Bilirubin: 0.4 mg/dL (calc) (ref 0.2–1.2)
Total Bilirubin: 0.6 mg/dL (ref 0.2–1.2)
Total Protein: 7.2 g/dL (ref 6.1–8.1)

## 2017-03-31 LAB — BASIC METABOLIC PANEL WITH GFR
BUN: 17 mg/dL (ref 7–25)
CO2: 29 mmol/L (ref 20–32)
CREATININE: 0.95 mg/dL (ref 0.50–1.05)
Calcium: 10 mg/dL (ref 8.6–10.4)
Chloride: 103 mmol/L (ref 98–110)
GFR, Est African American: 80 mL/min/{1.73_m2} (ref >=60)
GFR, Est Non African American: 69 mL/min/{1.73_m2} (ref >=60)
GLUCOSE: 137 mg/dL — AB (ref 65–99)
Potassium: 4.7 mmol/L (ref 3.5–5.3)
SODIUM: 142 mmol/L (ref 135–146)

## 2017-03-31 LAB — HEMOGLOBIN A1C
Hgb A1c MFr Bld: 7.5 % of total Hgb — ABNORMAL HIGH (ref <5.7)
Mean Plasma Glucose: 169 (calc)
eAG (mmol/L): 9.3 (calc)

## 2017-03-31 LAB — MAGNESIUM: Magnesium: 2 mg/dL (ref 1.5–2.5)

## 2017-03-31 MED ORDER — ATORVASTATIN CALCIUM 20 MG PO TABS: 20.0000 mg | ORAL_TABLET | Freq: Every day | ORAL | 3 refills | Status: DC

## 2017-04-10 ENCOUNTER — Other Ambulatory Visit: Payer: Self-pay | Admitting: Physician Assistant

## 2017-04-27 ENCOUNTER — Encounter: Payer: Self-pay | Admitting: Physician Assistant

## 2017-04-27 ENCOUNTER — Other Ambulatory Visit: Payer: Self-pay | Admitting: Internal Medicine

## 2017-04-27 MED ORDER — NEOMYCIN-POLYMYXIN-DEXAMETH 3.5-10000-0.1 OP SUSP: OPHTHALMIC | 1 refills | Status: DC

## 2017-05-01 ENCOUNTER — Other Ambulatory Visit: Payer: Self-pay | Admitting: Internal Medicine

## 2017-05-01 ENCOUNTER — Encounter (INDEPENDENT_AMBULATORY_CARE_PROVIDER_SITE_OTHER): Payer: Self-pay

## 2017-05-01 ENCOUNTER — Other Ambulatory Visit: Payer: Self-pay

## 2017-05-01 ENCOUNTER — Ambulatory Visit (AMBULATORY_SURGERY_CENTER): Payer: Self-pay | Admitting: *Deleted

## 2017-05-01 VITALS — Ht 65.0 in | Wt 227.0 lb

## 2017-05-01 DIAGNOSIS — Z86010 Personal history of colonic polyps: Secondary | ICD-10-CM

## 2017-05-01 DIAGNOSIS — Z800 Family history of malignant neoplasm of digestive organs: Secondary | ICD-10-CM

## 2017-05-01 MED ORDER — NA SULFATE-K SULFATE-MG SULF 17.5-3.13-1.6 GM/177ML PO SOLN: ORAL | 0 refills | Status: DC

## 2017-05-01 MED ORDER — ALPRAZOLAM 0.5 MG PO TABS: ORAL_TABLET | ORAL | 0 refills | Status: DC

## 2017-05-01 NOTE — Progress Notes (Signed)
Patient denies any allergies to eggs or soy. Patient denies any problems with anesthesia/sedation. Patient denies any oxygen use at home. Patient denies taking any diet/weight loss medications or blood thinners. EMMI education assisgned to patient on colonoscopy, this was explained and instructions given to patient. Suprep coupon printed and given to pt today.

## 2017-05-02 ENCOUNTER — Encounter: Payer: Self-pay | Admitting: Gastroenterology

## 2017-05-09 ENCOUNTER — Other Ambulatory Visit: Payer: Self-pay | Admitting: Physician Assistant

## 2017-05-09 NOTE — Progress Notes (Signed)
Diabetes Education and Follow-Up Visit  Has increased stressed with her mom with dementia. Her mom had a fall had dementia, had delirum, had normal CT, had UTI, she is now at [facility], very aggressive, adjusting medications. Will start with medicaid and long term placement for her, she may try to get hospice referral.   She has cervical neck pain, has been seeing chiropractor, having right neck pain, right shoulder pain, no pain down her arms, no weakness.   52 y.o.female presents for diabetic education/obesity. She has Diabetes Mellitus type 2 and morbid obesity.   Last hemoglobin A1c was: Lab Results  Component Value Date   HGBA1C 7.5 (H) 03/30/2017   HGBA1C 8.1 (H) 10/06/2016   HGBA1C 7.2 (H) 04/18/2016   BMI is Body mass index is 38.21 kg/m., she is working on diet and exercise. Wt Readings from Last 3 Encounters:  05/10/17 229 lb 9.6 oz (104.1 kg)  05/01/17 227 lb (103 kg)  03/30/17 223 lb (101.2 kg)   Patient does have CKD She is on ACE/ARB  Lab Results  Component Value Date   GFRNONAA 69 03/30/2017    Lab Results  Component Value Date   CREATININE 0.95 03/30/2017   BUN 17 03/30/2017   NA 142 03/30/2017   K 4.7 03/30/2017   CL 103 03/30/2017   CO2 29 03/30/2017    Lab Results  Component Value Date   MICROALBUR <0.2 10/06/2016     She is on a Statin, lipitor 13m.  She is not at goal of less than 70.  Lab Results  Component Value Date   CHOL 191 03/30/2017   HDL 44 (L) 03/30/2017   LDLCALC 105 (H) 10/06/2016   TRIG 218 (H) 03/30/2017   CHOLHDL 4.3 03/30/2017     Problem List has OBSTRUCTIVE SLEEP APNEA; Essential hypertension; Exercise induced bronchospasm; Obesity; Transaminitis; Fatty liver; Iron deficiency anemia; and 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) on their problem list.  Medications Current Outpatient Medications on File Prior to Visit  Medication Sig  . ALPRAZolam (XANAX) 0.5 MG tablet Take 1/2 to 1 tablet 2 to 3 x / day  only if needed for anxiety attack  . aspirin EC 81 MG tablet Take 81 mg by mouth daily.  .Marland Kitchenatorvastatin (LIPITOR) 20 MG tablet Take 1 tablet (20 mg total) by mouth daily.  . Cholecalciferol (VITAMIN D-3) 5000 UNITS TABS Take by mouth daily.  .Marland KitchenFARXIGA 10 MG TABS tablet TAKE 1 TABLET ONCE DAILY.  .Marland Kitchenglucose blood test strip Use as instructed  . glucose monitoring kit (FREESTYLE) monitoring kit 1 each by Does not apply route as needed for other.  .Marland KitchenJANUVIA 100 MG tablet TAKE 1 TABLET BY MOUTH DAILY.  .Marland Kitchenlisinopril-hydrochlorothiazide (PRINZIDE,ZESTORETIC) 20-12.5 MG tablet TAKE 1 TABLET BY MOUTH DAILY.  . Magnesium 500 MG CAPS Take 1 capsule by mouth daily. Taking 9542mdaily  . metFORMIN (GLUCOPHAGE-XR) 500 MG 24 hr tablet TAKE (2) TABLETS TWICE DAILY.  . Na Sulfate-K Sulfate-Mg Sulf 17.5-3.13-1.6 GM/177ML SOLN Suprep (no substitutions)-TAKE AS DIRECTED.  . Marland Kitchenmeprazole (PRILOSEC) 20 MG capsule TAKE 1 CAPSULE EVERY DAY.   No current facility-administered medications on file prior to visit.     ROS- see HPI  Physical Exam: Blood pressure 136/90, pulse 86, temperature 97.6 F (36.4 C), resp. rate 18, height _0  (1.651 m), weight 229 lb 9.6 oz (104.1 kg), SpO2 96 %. Body mass index is 38.21 kg/m. General Appearance: Well nourished, in no apparent distress. Eyes: PERRLA,  EOMs, conjunctiva no swelling or erythema ENT/Mouth: Ext aud canals clear, TMs without erythema, bulging. No erythema, swelling, or exudate on post pharynx.  Tonsils not swollen or erythematous. Hearing normal.  Respiratory: Respiratory effort normal, BS equal bilaterally without rales, rhonchi, wheezing or stridor.  Cardio: RRR with no MRGs. Brisk peripheral pulses without edema.  Abdomen: Soft, + BS.  Non tender, no guarding, rebound, hernias, masses. Musculoskeletal: Full ROM, 5/5 strength, normal gait. normal range of motion, without spinous process tenderness, with paraspinal muscle tenderness the right side, normal  sensation, reflexes, and pulses distal.  Skin: Warm, dry without rashes, lesions, ecchymosis.  Neuro: Cranial nerves intact. Normal muscle tone, no cerebellar symptoms. Sensation intact.    Plan and Assessment:  Type 2 diabetes mellitus with other diabetic kidney complication, without long-term current use of insulin ([facility]) Discussed disease progression and risks Discussed diet/exercise, weight management and risk modification  Fatty liver Weight loss  Neck pain -     Ambulatory referral to Orthopedics -     meloxicam (MOBIC) 15 MG tablet; Take one daily with food for 2 weeks, can take with tylenol, can not take with aleve, iburpofen, then as needed daily for pain -     cyclobenzaprine (FLEXERIL) 10 MG tablet; Take 1 tablet (10 mg total) by mouth at bedtime as needed for muscle spasms.  Morbid obesity ([facility]) -     buPROPion (WELLBUTRIN XL) 150 MG 24 hr tablet; Take 1 tablet (150 mg total) by mouth every morning. - follow up 2 months for progress monitoring - increase veggies, decrease carbs - long discussion about weight loss, diet, and exercise - can do naltrexone compound 2.51m at night for weight loss      Future Appointments  Date Time Provider DHissop 05/15/2017  9:30 AM SLadene Artist MD [facility]  10/11/2017  3:00 PM CVicie Mutters PA-C [facility] None

## 2017-05-10 ENCOUNTER — Encounter: Payer: Self-pay | Admitting: Physician Assistant

## 2017-05-10 ENCOUNTER — Ambulatory Visit: Payer: Managed Care, Other (non HMO) | Admitting: Physician Assistant

## 2017-05-10 VITALS — BP 136/90 | HR 86 | Temp 97.6°F | Resp 18 | Ht 65.0 in | Wt 229.6 lb

## 2017-05-10 DIAGNOSIS — M542 Cervicalgia: Secondary | ICD-10-CM

## 2017-05-10 DIAGNOSIS — K760 Fatty (change of) liver, not elsewhere classified: Secondary | ICD-10-CM

## 2017-05-10 DIAGNOSIS — E1129 Type 2 diabetes mellitus with other diabetic kidney complication: Secondary | ICD-10-CM

## 2017-05-10 MED ORDER — MELOXICAM 15 MG PO TABS: ORAL_TABLET | ORAL | 1 refills | Status: DC

## 2017-05-10 MED ORDER — CYCLOBENZAPRINE HCL 10 MG PO TABS: 10.0000 mg | ORAL_TABLET | Freq: Every evening | ORAL | 0 refills | Status: DC | PRN

## 2017-05-10 MED ORDER — BUPROPION HCL ER (XL) 150 MG PO TB24: 150.0000 mg | ORAL_TABLET | ORAL | 2 refills | Status: DC

## 2017-05-10 NOTE — Patient Instructions (Addendum)
Try the exercises and other information below, meloxicam once during the day as needed (avoid taking other NSAIDS like Alleve or Ibuprofen while taking this) and then flexeril if needed at bedtime for muscle spasm. This can be taken up to every 8 hours, but causes sedation, so should not drive or operate heavy machinery while taking this medicine.   Go to the ER if you have any new weakness in your arms, trouble with your grip, worse headache ever, fever, chills. or have worsening pain.   Will refer to ortho   Cervical Sprain A cervical sprain is a stretch or tear in one or more of the tough, cord-like tissues that connect bones (ligaments) in the neck. Cervical sprains can range from mild to severe. Severe cervical sprains can cause the spinal bones (vertebrae) in the neck to be unstable. This can lead to spinal cord damage and can result in serious nervous system problems. The amount of time that it takes for a cervical sprain to get better depends on the cause and extent of the injury. Most cervical sprains heal in 4-6 weeks. What are the causes? Cervical sprains may be caused by an injury (trauma), such as from a motor vehicle accident, a fall, or sudden forward and backward whipping movement of the head and neck (whiplash injury). Mild cervical sprains may be caused by wear and tear over time, such as from poor posture, sitting in a chair that does not provide support, or looking up or down for long periods of time. What increases the risk? The following factors may make you more likely to develop this condition:  Participating in activities that have a high risk of trauma to the neck. These include contact sports, auto racing, gymnastics, and diving.  Taking risks when driving or riding in a motor vehicle, such as speeding.  Having osteoarthritis of the spine.  Having poor strength and flexibility of the neck.  A previous neck injury.  Having poor posture.  Spending a lot of time in  certain positions that put stress on the neck, such as sitting at a computer for long periods of time.  What are the signs or symptoms? Symptoms of this condition include:  Pain, soreness, stiffness, tenderness, swelling, or a burning sensation in the front, back, or sides of the neck.  Sudden tightening of neck muscles that you cannot control (muscle spasms).  Pain in the shoulders or upper back.  Limited ability to move the neck.  Headache.  Dizziness.  Nausea.  Vomiting.  Weakness, numbness, or tingling in a hand or an arm.  Symptoms may develop right away after injury, or they may develop over a few days. In some cases, symptoms may go away with treatment and return (recur) over time. How is this diagnosed? This condition may be diagnosed based on:  Your medical history.  Your symptoms.  Any recent injuries or known neck problems that you have, such as arthritis in the neck.  A physical exam.  Imaging tests, such as: ? X-rays. ? MRI. ? CT scan.  How is this treated? This condition is treated by resting and icing the injured area and doing physical therapy exercises. Depending on the severity of your condition, treatment may also include:  Keeping your neck in place (immobilized) for periods of time. This may be done using: ? A cervical collar. This supports your chin and the back of your head. ? A cervical traction device. This is a sling that holds up your head. This  removes weight and pressure from your neck, and it may help to relieve pain.  Medicines that help to relieve pain and inflammation.  Medicines that help to relax your muscles (muscle relaxants).  Surgery. This is rare.  Follow these instructions at home: If you have a cervical collar:  Wear it as told by your health care provider. Do not remove the collar unless instructed by your health care provider.  Ask your health care provider before you make any adjustments to your collar.  If you  have long hair, keep it outside of the collar.  Ask your health care provider if you can remove the collar for cleaning and bathing. If you are allowed to remove the collar for cleaning or bathing: ? Follow instructions from your health care provider about how to remove the collar safely. ? Clean the collar by wiping it with mild soap and water and drying it completely. ? If your collar has removable pads, remove them every 1-2 days and wash them by hand with soap and water. Let them air-dry completely before you put them back in the collar. ? Check your skin under the collar for irritation or sores. If you see any, tell your health care provider. Managing pain, stiffness, and swelling  If directed, use a cervical traction device as told by your health care provider.  If directed, apply heat to the affected area before you do your physical therapy or as often as told by your health care provider. Use the heat source that your health care provider recommends, such as a moist heat pack or a heating pad. ? Place a towel between your skin and the heat source. ? Leave the heat on for 20-30 minutes. ? Remove the heat if your skin turns bright red. This is especially important if you are unable to feel pain, heat, or cold. You may have a greater risk of getting burned.  If directed, put ice on the affected area: ? Put ice in a plastic bag. ? Place a towel between your skin and the bag. ? Leave the ice on for 20 minutes, 2-3 times a day. Activity  Do not drive while wearing a cervical collar. If you do not have a cervical collar, ask your health care provider if it is safe to drive while your neck heals.  Do not drive or use heavy machinery while taking prescription pain medicine or muscle relaxants, unless your health care provider approves.  Do not lift anything that is heavier than 10 lb (4.5 kg) until your health care provider tells you that it is safe.  Rest as directed by your health care  provider. Avoid positions and activities that make your symptoms worse. Ask your health care provider what activities are safe for you.  If physical therapy was prescribed, do exercises as told by your health care provider or physical therapist. General instructions  Take over-the-counter and prescription medicines only as told by your health care provider.  Do not use any products that contain nicotine or tobacco, such as cigarettes and e-cigarettes. These can delay healing. If you need help quitting, ask your health care provider.  Keep all follow-up visits as told by your health care provider or physical therapist. This is important. How is this prevented? To prevent a cervical sprain from happening again:  Use and maintain good posture. Make any needed adjustments to your workstation to help you use good posture.  Exercise regularly as directed by your health care provider or physical  therapist.  Avoid risky activities that may cause a cervical sprain.  Contact a health care provider if:  You have symptoms that get worse or do not get better after 2 weeks of treatment.  You have pain that gets worse or does not get better with medicine.  You develop new, unexplained symptoms.  You have sores or irritated skin on your neck from wearing your cervical collar. Get help right away if:  You have severe pain.  You develop numbness, tingling, or weakness in any part of your body.  You cannot move a part of your body (you have paralysis).  You have neck pain along with: ? Severe dizziness. ? Headache. Summary  A cervical sprain is a stretch or tear in one or more of the tough, cord-like tissues that connect bones (ligaments) in the neck.  Cervical sprains may be caused by an injury (trauma), such as from a motor vehicle accident, a fall, or sudden forward and backward whipping movement of the head and neck (whiplash injury).  Symptoms may develop right away after injury, or  they may develop over a few days.  This condition is treated by resting and icing the injured area and doing physical therapy exercises. This information is not intended to replace advice given to you by your health care provider. Make sure you discuss any questions you have with your health care provider. Document Released: 01/02/2007 Document Revised: 11/04/2015 Document Reviewed: 11/04/2015 [employer] Interactive Patient Education  2017 [employer]   Cervical Strain and Sprain Rehab Ask your health care provider which exercises are safe for you. Do exercises exactly as told by your health care provider and adjust them as directed. It is normal to feel mild stretching, pulling, tightness, or discomfort as you do these exercises, but you should stop right away if you feel sudden pain or your pain gets worse.Do not begin these exercises until told by your health care provider. Stretching and range of motion exercises These exercises warm up your muscles and joints and improve the movement and flexibility of your neck. These exercises also help to relieve pain, numbness, and tingling. Exercise A: Cervical side bend  1. Using good posture, sit on a stable chair or stand up. 2. Without moving your shoulders, slowly tilt your left / right ear to your shoulder until you feel a stretch in your neck muscles. You should be looking straight ahead. 3. Hold for __________ seconds. 4. Repeat with the other side of your neck. Repeat __________ times. Complete this exercise __________ times a day. Exercise B: Cervical rotation  1. Using good posture, sit on a stable chair or stand up. 2. Slowly turn your head to the side as if you are looking over your left / right shoulder. ? Keep your eyes level with the ground. ? Stop when you feel a stretch along the side and the back of your neck. 3. Hold for __________ seconds. 4. Repeat this by turning to your other side. Repeat __________ times. Complete this  exercise __________ times a day. Exercise C: Thoracic extension and pectoral stretch 1. Roll a towel or a small blanket so it is about 4 inches (10 cm) in diameter. 2. Lie down on your back on a firm surface. 3. Put the towel lengthwise, under your spine in the middle of your back. It should not be not under your shoulder blades. The towel should line up with your spine from your middle back to your lower back. 4. Put your hands  behind your head and let your elbows fall out to your sides. 5. Hold for __________ seconds. Repeat __________ times. Complete this exercise __________ times a day. Strengthening exercises These exercises build strength and endurance in your neck. Endurance is the ability to use your muscles for a long time, even after your muscles get tired. Exercise D: Upper cervical flexion, isometric 1. Lie on your back with a thin pillow behind your head and a small rolled-up towel under your neck. 2. Gently tuck your chin toward your chest and nod your head down to look toward your feet. Do not lift your head off the pillow. 3. Hold for __________ seconds. 4. Release the tension slowly. Relax your neck muscles completely before you repeat this exercise. Repeat __________ times. Complete this exercise __________ times a day. Exercise E: Cervical extension, isometric  1. Stand about 6 inches (15 cm) away from a wall, with your back facing the wall. 2. Place a soft object, about 6-8 inches (15-20 cm) in diameter, between the back of your head and the wall. A soft object could be a small pillow, a ball, or a folded towel. 3. Gently tilt your head back and press into the soft object. Keep your jaw and forehead relaxed. 4. Hold for __________ seconds. 5. Release the tension slowly. Relax your neck muscles completely before you repeat this exercise. Repeat __________ times. Complete this exercise __________ times a day. Posture and body mechanics  Body mechanics refers to the  movements and positions of your body while you do your daily activities. Posture is part of body mechanics. Good posture and healthy body mechanics can help to relieve stress in your body's tissues and joints. Good posture means that your spine is in its natural S-curve position (your spine is neutral), your shoulders are pulled back slightly, and your head is not tipped forward. The following are general guidelines for applying improved posture and body mechanics to your everyday activities. Standing  When standing, keep your spine neutral and keep your feet about hip-width apart. Keep a slight bend in your knees. Your ears, shoulders, and hips should line up.  When you do a task in which you stand in one place for a long time, place one foot up on a stable object that is 2-4 inches (5-10 cm) high, such as a footstool. This helps keep your spine neutral. Sitting   When sitting, keep your spine neutral and your keep feet flat on the floor. Use a footrest, if necessary, and keep your thighs parallel to the floor. Avoid rounding your shoulders, and avoid tilting your head forward.  When working at a desk or a computer, keep your desk at a height where your hands are slightly lower than your elbows. Slide your chair under your desk so you are close enough to maintain good posture.  When working at a computer, place your monitor at a height where you are looking straight ahead and you do not have to tilt your head forward or downward to look at the screen. Resting When lying down and resting, avoid positions that are most painful for you. Try to support your neck in a neutral position. You can use a contour pillow or a small rolled-up towel. Your pillow should support your neck but not push on it. This information is not intended to replace advice given to you by your health care provider. Make sure you discuss any questions you have with your health care provider. Document Released: 03/07/2005 Document  Revised: 11/12/2015 Document Reviewed: 02/11/2015 Elsevier Interactive Patient Education  [employer].   Being dehydrated can hurt your kidneys, cause fatigue, headaches, muscle aches, joint pain, and dry skin/nails so please increase your fluids.   Drink 80-100 oz a day of water, measure it out!  Can check out plantnanny app on your phone to help you keep track of your water   Intermittent fasting is more about strategy than starvation. It's meant to reset your body in different ways, hopefully with fitness and nutrition changes as a result.  Like any big switchover, though, results may vary when it comes down to the individual level. What works for your friends may not work for you, or vice versa. That's why it's helpful to play around with variations on intermittent fasting and healthy habits and find what works best for you.  WHAT IS INTERMITTENT FASTING AND WHY DO IT?  Intermittent fasting doesn't involve specific foods, but rather, a strict schedule regarding when you eat. Also called "time-restricted eating," the tactic has been praised for its contribution to weight loss, improved body composition, and decreased cravings. Preliminary research also suggests it may be beneficial for glucose tolerance, hormone regulation, better muscle mass and lower body fat.  Part of its appeal is the simplicity of the effort. Unlike some other trends, there's no calculations to intermittent fasting.  You simply eat within a certain block of time, usually a window of 8-10 hours. In the other big block of time - about 14-16 hours, including when you're asleep - you don't eat anything, not even snacks. You can drink water, coffee, tea or any other beverage that doesn't have calories.  For example, if you like having a late dinner, you might skip breakfast and have your first meal at noon and your last meal of the day at 8 p.m., and then not eat until noon again the next day.  IDEAS FOR GETTING  STARTED  If you're new to the strategy, it may be helpful to eat within the typical circadian rhythm and keep eating within daylight hours. This can be especially beneficial if you're looking at intermittent fasting for weight-loss goals.  So first try only eating between 12pm to 8pm.  Outside of this time you may have water, black coffee, and hot tea. You may not eat it drink anything that has carbs, sugars, OR artificial sugars like diet soda.   Like any major eating and fitness shift, it can take time to find the perfect fit, so don't be afraid to experiment with different options - including ditching intermittent fasting altogether if it's simply not for you. But if it is, you may be surprised by some of the benefits that come along with the strategy.  Dining out: help on how to choose  It is better if you can meal plan and eat at your house but sometimes life happens so here are some tips to help you make healthier choices while eating out:  1) Ask for your server to pack up 1/2 of your meal to take home for lunch or later. Portion sizes are huge so if you don't have all of it in front of you, you are less likely to eat it all.    2) Ask if they have a smaller portion or lunch portion available, this is often cheaper as well!  3) Ask to not have the bread basket brought to the table  4) Ask for the dressing on the side.   5) Look  at the nutrition menu BEFORE you go to a restaurant or fast food chain and have what you will eat in mind. You can also look it up on food tracking ups like Myfitness Pal or Lost it. However the web site for the restaurant is usually the best bet.   6) Don't forget that you are the costumer, it is okay to ask them to leave things out, ask about substituting, or ask them to cook with less oil!  Here is some more general information for you!     Are you an emotional eater? Do you eat more when you're feeling stressed? Do you eat when you're not hungry or  when you're full? Do you eat to feel better (to calm and soothe yourself when you're sad, mad, bored, anxious, etc.)? Do you reward yourself with food? Do you regularly eat until you've stuffed yourself? Does food make you feel safe? Do you feel like food is a friend? Do you feel powerless or out of control around food?  If you answered yes to some of these questions than it is likely that you are an emotional eater. This is normally a learned behavior and can take time to first recognize the signs and second BREAK THE HABIT. But here is more information and tips to help.   The difference between emotional hunger and physical hunger Emotional hunger can be powerful, so it's easy to mistake it for physical hunger. But there are clues you can look for to help you tell physical and emotional hunger apart.  Emotional hunger comes on suddenly. It hits you in an instant and feels overwhelming and urgent. Physical hunger, on the other hand, comes on more gradually. The urge to eat doesn't feel as dire or demand instant satisfaction (unless you haven't eaten for a very long time).  Emotional hunger craves specific comfort foods. When you're physically hungry, almost anything sounds good-including healthy stuff like vegetables. But emotional hunger craves junk food or sugary snacks that provide an instant rush. You feel like you need cheesecake or pizza, and nothing else will do.  Emotional hunger often leads to mindless eating. Before you know it, you've eaten a whole bag of chips or an entire pint of ice cream without really paying attention or fully enjoying it. When you're eating in response to physical hunger, you're typically more aware of what you're doing.  Emotional hunger isn't satisfied once you're full. You keep wanting more and more, often eating until you're uncomfortably stuffed. Physical hunger, on the other hand, doesn't need to be stuffed. You feel satisfied when your stomach is  full.  Emotional hunger isn't located in the stomach. Rather than a growling belly or a pang in your stomach, you feel your hunger as a craving you can't get out of your head. You're focused on specific textures, tastes, and smells.  Emotional hunger often leads to regret, guilt, or shame. When you eat to satisfy physical hunger, you're unlikely to feel guilty or ashamed because you're simply giving your body what it needs. If you feel guilty after you eat, it's likely because you know deep down that you're not eating for nutritional reasons.  Identify your emotional eating triggers What situations, places, or feelings make you reach for the comfort of food? Most emotional eating is linked to unpleasant feelings, but it can also be triggered by positive emotions, such as rewarding yourself for achieving a goal or celebrating a holiday or happy event. Common causes of emotional eating  include:  Stuffing emotions - Eating can be a way to temporarily silence or "stuff down" uncomfortable emotions, including anger, fear, sadness, anxiety, loneliness, resentment, and shame. While you're numbing yourself with food, you can avoid the difficult emotions you'd rather not feel.  Boredom or feelings of emptiness - Do you ever eat simply to give yourself something to do, to relieve boredom, or as a way to fill a void in your life? You feel unfulfilled and empty, and food is a way to occupy your mouth and your time. In the moment, it fills you up and distracts you from underlying feelings of purposelessness and dissatisfaction with your life.  Childhood habits - Think back to your childhood memories of food. Did your parents reward good behavior with ice cream, take you out for pizza when you got a good report card, or serve you sweets when you were feeling sad? These habits can often carry over into adulthood. Or your eating may be driven by nostalgia-for cherished memories of grilling burgers in the backyard with  your dad or baking and eating cookies with your mom.  Social influences - Getting together with other people for a meal is a great way to relieve stress, but it can also lead to overeating. It's easy to overindulge simply because the food is there or because everyone else is eating. You may also overeat in social situations out of nervousness. Or perhaps your family or circle of friends encourages you to overeat, and it's easier to go along with the group.  Stress - Ever notice how stress makes you hungry? It's not just in your mind. When stress is chronic, as it so often is in our chaotic, fast-paced world, your body produces high levels of the stress hormone, cortisol. Cortisol triggers cravings for salty, sweet, and fried foods-foods that give you a burst of energy and pleasure. The more uncontrolled stress in your life, the more likely you are to turn to food for emotional relief.  Find other ways to feed your feelings If you don't know how to manage your emotions in a way that doesn't involve food, you won't be able to control your eating habits for very long. Diets so often fail because they offer logical nutritional advice which only works if you have conscious control over your eating habits. It doesn't work when emotions hijack the process, demanding an immediate payoff with food.  In order to stop emotional eating, you have to find other ways to fulfill yourself emotionally. It's not enough to understand the cycle of emotional eating or even to understand your triggers, although that's a huge first step. You need alternatives to food that you can turn to for emotional fulfillment.  Alternatives to emotional eating If you're depressed or lonely, call someone who always makes you feel better, play with your dog or cat, or look at a favorite photo or cherished memento.  If you're anxious, expend your nervous energy by dancing to your favorite song, squeezing a stress ball, or taking a brisk  walk.  If you're exhausted, treat yourself with a hot cup of tea, take a bath, light some scented candles, or wrap yourself in a warm blanket.  If you're bored, read a good book, watch a comedy show, explore the outdoors, or turn to an activity you enjoy (woodworking, playing the guitar, shooting hoops, scrapbooking, etc.).  What is mindful eating? Mindful eating is a practice that develops your awareness of eating habits and allows you to pause between  your triggers and your actions. Most emotional eaters feel powerless over their food cravings. When the urge to eat hits, you feel an almost unbearable tension that demands to be fed, right now. Because you've tried to resist in the past and failed, you believe that your willpower just isn't up to snuff. But the truth is that you have more power over your cravings than you think.  Take 5 before you give in to a craving Emotional eating tends to be automatic and virtually mindless. Before you even realize what you're doing, you've reached for a tub of ice cream and polished off half of it. But if you can take a moment to pause and reflect when you're hit with a craving, you give yourself the opportunity to make a different decision.  Can you put off eating for five minutes? Or just start with one minute. Don't tell yourself you can't give in to the craving; remember, the forbidden is extremely tempting. Just tell yourself to wait.  While you're waiting, check in with yourself. How are you feeling? What's going on emotionally? Even if you end up eating, you'll have a better understanding of why you did it. This can help you set yourself up for a different response next time.  How to practice mindful eating Eating while you're also doing other things-such as watching TV, driving, or playing with your phone-can prevent you from fully enjoying your food. Since your mind is elsewhere, you may not feel satisfied or continue eating even though you're no  longer hungry. Eating more mindfully can help focus your mind on your food and the pleasure of a meal and curb overeating.   Eat your meals in a calm place with no distractions, aside from any dining companions.  Try eating with your non-dominant hand or using chopsticks instead of a knife and fork. Eating in such a non-familiar way can slow down how fast you eat and ensure your mind stays focused on your food.  Allow yourself enough time not to have to rush your meal. Set a timer for 20 minutes and pace yourself so you spend at least that much time eating.  Take small bites and chew them well, taking time to notice the different flavors and textures of each mouthful.  Put your utensils down between bites. Take time to consider how you feel-hungry, satiated-before picking up your utensils again.  Try to stop eating before you are full.It takes time for the signal to reach your brain that you've had enough. Don't feel obligated to always clean your plate.  When you've finished your food, take a few moments to assess if you're really still hungry before opting for an extra serving or dessert.  Learn to accept your feelings-even the bad ones  While it may seem that the core problem is that you're powerless over food, emotional eating actually stems from feeling powerless over your emotions. You don't feel capable of dealing with your feelings head on, so you avoid them with food.  Recommended reading  Mini Habits for weight loss  Healthy Eating: A guide to the new nutrition - [place] Report  10 Tips for Mindful Eating - How mindfulness can help you fully enjoy a meal and the experience of eating-with moderation and restraint. ([place])  Weight Loss: Gain Control of Emotional Eating - Tips to regain control of your eating habits. [facility])  Why Stress Causes People to Overeat -Tips on controlling stress eating. ([employer]  [facility])  Mindful Eating Meditations -Free online mindfulness meditations. ([facility])

## 2017-05-15 ENCOUNTER — Encounter: Payer: Self-pay | Admitting: Gastroenterology

## 2017-05-15 ENCOUNTER — Ambulatory Visit (AMBULATORY_SURGERY_CENTER): Payer: Managed Care, Other (non HMO) | Admitting: Gastroenterology

## 2017-05-15 ENCOUNTER — Other Ambulatory Visit: Payer: Self-pay

## 2017-05-15 VITALS — BP 109/63 | HR 62 | Temp 99.6°F | Resp 21 | Ht 65.0 in | Wt 227.0 lb

## 2017-05-15 DIAGNOSIS — Z1211 Encounter for screening for malignant neoplasm of colon: Secondary | ICD-10-CM | POA: Diagnosis not present

## 2017-05-15 DIAGNOSIS — D128 Benign neoplasm of rectum: Secondary | ICD-10-CM

## 2017-05-15 DIAGNOSIS — Z86010 Personal history of colonic polyps: Secondary | ICD-10-CM | POA: Diagnosis present

## 2017-05-15 DIAGNOSIS — K621 Rectal polyp: Secondary | ICD-10-CM

## 2017-05-15 DIAGNOSIS — Z800 Family history of malignant neoplasm of digestive organs: Secondary | ICD-10-CM

## 2017-05-15 MED ORDER — SODIUM CHLORIDE 0.9 % IV SOLN: 500.0000 mL | INTRAVENOUS | Status: DC

## 2017-05-15 NOTE — Progress Notes (Signed)
To recovery, report to RN, VSS. 

## 2017-05-15 NOTE — Op Note (Addendum)
[facility] Patient Name: Jenny Singleton Procedure Date: 05/15/2017 9:54 AM MRN: [id number] Endoscopist: Ladene Artist , MD Age: 52 Referring MD:  Date of Birth: May 10, 1965 Gender: Female Account #: [id number] Procedure:                Colonoscopy Indications:              Surveillance: Personal history of adenomatous                            polyps on last colonoscopy 5 years ago. Family                            history of colon cancer-1st degree relative. Medicines:                Monitored Anesthesia Care Procedure:                Pre-Anesthesia Assessment:                           - Prior to the procedure, a History and Physical                            was performed, and patient medications and                            allergies were reviewed. The patient's tolerance of                            previous anesthesia was also reviewed. The risks                            and benefits of the procedure and the sedation                            options and risks were discussed with the patient.                            All questions were answered, and informed consent                            was obtained. Prior Anticoagulants: The patient has                            taken no previous anticoagulant or antiplatelet                            agents. ASA Grade Assessment: II - A patient with                            mild systemic disease. After reviewing the risks                            and benefits, the patient was deemed in  satisfactory condition to undergo the procedure.                           After obtaining informed consent, the colonoscope                            was passed under direct vision. Throughout the                            procedure, the patient's blood pressure, pulse, and                            oxygen saturations were monitored continuously. The                            Model PCF-H190DL  [id number]) scope was introduced                            through the anus and advanced to the the cecum,                            identified by appendiceal orifice and ileocecal                            valve. The ileocecal valve, appendiceal orifice,                            and rectum were photographed. The quality of the                            bowel preparation was excellent. The colonoscopy                            was performed without difficulty. The patient                            tolerated the procedure well. Scope In: 10:04:14 AM Scope Out: 10:17:15 AM Scope Withdrawal Time: 0 hours 11 minutes 34 seconds  Total Procedure Duration: 0 hours 13 minutes 1 second  Findings:                 The perianal and digital rectal examinations were                            normal.                           A 5 mm polyp was found in the rectum. The polyp was                            sessile. The polyp was removed with a cold snare.                            Resection and retrieval were complete.  Internal hemorrhoids were found during                            retroflexion. The hemorrhoids were small and Grade                            I (internal hemorrhoids that do not prolapse).                           The exam was otherwise without abnormality on                            direct and retroflexion views. Complications:            No immediate complications. Estimated blood loss:                            None. Estimated Blood Loss:     Estimated blood loss: none. Impression:               - One 5 mm polyp in the rectum, removed with a cold                            snare. Resected and retrieved.                           - Internal hemorrhoids.                           - The examination was otherwise normal on direct                            and retroflexion views. Recommendation:           - Repeat colonoscopy in 5 years for  surveillance.                           - Patient has a contact number available for                            emergencies. The signs and symptoms of potential                            delayed complications were discussed with the                            patient. Return to normal activities tomorrow.                            Written discharge instructions were provided to the                            patient.                           - Resume previous diet.                           -  Continue present medications.                           - Await pathology results. Ladene Artist, MD 05/15/2017 10:32:40 AM This report has been signed electronically.

## 2017-05-15 NOTE — Patient Instructions (Signed)
Handout given on polyps  YOU HAD AN ENDOSCOPIC PROCEDURE TODAY: Refer to the procedure report and other information in the discharge instructions given to you for any specific questions about what was found during the examination. If this information does not answer your questions, please call [place] office at [phone] to clarify.   YOU SHOULD EXPECT: Some feelings of bloating in the abdomen. Passage of more gas than usual. Walking can help get rid of the air that was put into your GI tract during the procedure and reduce the bloating. If you had a lower endoscopy (such as a colonoscopy or flexible sigmoidoscopy) you may notice spotting of blood in your stool or on the toilet paper. Some abdominal soreness may be present for a day or two, also.  DIET: Your first meal following the procedure should be a light meal and then it is ok to progress to your normal diet. A half-sandwich or bowl of soup is an example of a good first meal. Heavy or fried foods are harder to digest and may make you feel nauseous or bloated. Drink plenty of fluids but you should avoid alcoholic beverages for 24 hours. If you had a esophageal dilation, please see attached instructions for diet.    ACTIVITY: Your care partner should take you home directly after the procedure. You should plan to take it easy, moving slowly for the rest of the day. You can resume normal activity the day after the procedure however YOU SHOULD NOT DRIVE, use power tools, machinery or perform tasks that involve climbing or major physical exertion for 24 hours (because of the sedation medicines used during the test).   SYMPTOMS TO REPORT IMMEDIATELY: A gastroenterologist can be reached at any hour. Please call [phone]  for any of the following symptoms:  Following lower endoscopy (colonoscopy, flexible sigmoidoscopy) Excessive amounts of blood in the stool  Significant tenderness, worsening of abdominal pains  Swelling of the abdomen that is  new, acute  Fever of 100 or higher    FOLLOW UP:  If any biopsies were taken you will be contacted by phone or by letter within the next 1-3 weeks. Call [phone]  if you have not heard about the biopsies in 3 weeks.  Please also call with any specific questions about appointments or follow up tests.  

## 2017-05-15 NOTE — Progress Notes (Signed)
Pt. Reports no change in her medical or surgical history since her pre-visit 05/01/17.

## 2017-05-15 NOTE — Progress Notes (Signed)
Called to room to assist during endoscopic procedure.  Patient ID and intended procedure confirmed with present staff. Received instructions for my participation in the procedure from the performing physician.  

## 2017-05-16 ENCOUNTER — Telehealth: Payer: Self-pay

## 2017-05-16 NOTE — Telephone Encounter (Signed)
Left message

## 2017-05-16 NOTE — Telephone Encounter (Signed)
NO ANSWER, MESSAGE LEFT FOR PATIENT. 

## 2017-05-20 ENCOUNTER — Encounter: Payer: Self-pay | Admitting: Gastroenterology

## 2017-06-08 ENCOUNTER — Other Ambulatory Visit: Payer: Self-pay | Admitting: Internal Medicine

## 2017-06-08 ENCOUNTER — Other Ambulatory Visit: Payer: Self-pay | Admitting: Physician Assistant

## 2017-07-05 ENCOUNTER — Other Ambulatory Visit: Payer: Self-pay | Admitting: Physician Assistant

## 2017-07-05 ENCOUNTER — Other Ambulatory Visit: Payer: Self-pay | Admitting: Internal Medicine

## 2017-07-09 NOTE — Progress Notes (Deleted)
Assessment and Plan:   Essential hypertension - continue medications, DASH diet, exercise and monitor at home. Call if greater than 130/80.  -     CBC with Differential/Platelet -     BASIC METABOLIC PANEL WITH GFR -     Hepatic function panel -     TSH  Fatty liver LONG discussion about cirrhosis and fatty liver -     Hepatic function panel  Type 2 diabetes mellitus with other diabetic kidney complication, without long-term current use of insulin ([place]) Discussed general issues about diabetes pathophysiology and management., Educational material distributed., Suggested low cholesterol diet., Encouraged aerobic exercise., Discussed foot care., Reminded to get yearly retinal exam. Will get eye exam Will continue farxiga until 1 month follow up Will bring food log -     Hemoglobin A1c  Class 2 severe obesity due to excess calories with serious comorbidity and body mass index (BMI) of 37.0 to 37.9 in adult [facility]) - follow up 1 months for progress monitoring - increase veggies, decrease carbs - long discussion about weight loss, diet, and exercise - bring food log  Mixed hyperlipidemia -continue medications, check lipids, decrease fatty foods, increase activity.  Not at goal of less than 70, recheck today but may need to increase dose -     Lipid panel  Medication management -     Magnesium  Continue diet and meds as discussed. Further disposition pending results of labs.  HPI 52 y.o. female  presents for 3 month follow up with hypertension, hyperlipidemia, diabetes and vitamin D. OVERDUE   Her blood pressure has been controlled at home, today their BP is  .   She does not workout. She denies chest pain, shortness of breath, dizziness.     She is on cholesterol medication, on pravastatin and denies myalgias. Her cholesterol is not at goal. The cholesterol last visit was:   Lab Results  Component Value Date   CHOL 191 03/30/2017   HDL 44 (L) 03/30/2017   LDLCALC 113 (H)  03/30/2017   TRIG 218 (H) 03/30/2017   CHOLHDL 4.3 03/30/2017    She has been working on diet and exercise for diabetes with CKD, she was on invokana but insurance was not covering, she wants to try jardiance, she is on MF and , and denies foot ulcerations, hyperglycemia, hypoglycemia , increased appetite, nausea, paresthesia of the feet, polydipsia, polyuria, visual disturbances, vomiting and weight loss. Last A1C in the office was:  Lab Results  Component Value Date   HGBA1C 7.5 (H) 03/30/2017   Lab Results  Component Value Date   GFRNONAA 69 03/30/2017   Patient is on Vitamin D supplement.  Lab Results  Component Value Date   VD25OH 35 07/20/2015     She continues to have stress and worry about her mother.  She reports that the insurance company promised her home health would be able to do more than they actually can.  She is still unable to convince her to go to a nursing home. BMI is There is no height or weight on file to calculate BMI., she is working on diet and exercise. Wt Readings from Last 3 Encounters:  05/15/17 227 lb (103 kg)  05/10/17 229 lb 9.6 oz (104.1 kg)  05/01/17 227 lb (103 kg)      Current Medications:  Current Outpatient Medications on File Prior to Visit  Medication Sig Dispense Refill  . ALPRAZolam (XANAX) 0.5 MG tablet Take 1/2 to 1 tablet 2 to 3 x /  day only if needed for anxiety attack 60 tablet 0  . aspirin EC 81 MG tablet Take 81 mg by mouth daily.    Marland Kitchen atorvastatin (LIPITOR) 20 MG tablet Take 1 tablet (20 mg total) by mouth daily. 30 tablet 3  . buPROPion (WELLBUTRIN XL) 150 MG 24 hr tablet Take 1 tablet (150 mg total) by mouth every morning. (Patient not taking: Reported on 05/15/2017) 30 tablet 2  . Cholecalciferol (VITAMIN D-3) 5000 UNITS TABS Take by mouth daily.    . cyclobenzaprine (FLEXERIL) 10 MG tablet Take 1 tablet (10 mg total) by mouth at bedtime as needed for muscle spasms. 90 tablet 0  . FARXIGA 10 MG TABS tablet TAKE 1 TABLET ONCE  DAILY. 30 tablet 1  . glucose blood test strip Use as instructed 100 each 12  . glucose monitoring kit (FREESTYLE) monitoring kit 1 each by Does not apply route as needed for other. 1 each 0  . JANUVIA 100 MG tablet TAKE 1 TABLET BY MOUTH DAILY. 30 tablet 0  . lisinopril (PRINIVIL,ZESTRIL) 20 MG tablet TAKE 1 TABLET BY MOUTH DAILY 90 tablet 1  . lisinopril-hydrochlorothiazide (PRINZIDE,ZESTORETIC) 20-12.5 MG tablet TAKE 1 TABLET BY MOUTH DAILY. 30 tablet 0  . Magnesium 500 MG CAPS Take 1 capsule by mouth daily. Taking 961m daily    . meloxicam (MOBIC) 15 MG tablet Take one daily with food for 2 weeks, can take with tylenol, can not take with aleve, iburpofen, then as needed daily for pain (Patient not taking: Reported on 05/15/2017) 30 tablet 1  . metFORMIN (GLUCOPHAGE-XR) 500 MG 24 hr tablet TAKE (2) TABLETS TWICE DAILY. 120 tablet 0  . omeprazole (PRILOSEC) 20 MG capsule TAKE 1 CAPSULE EVERY DAY. 30 capsule 0   Current Facility-Administered Medications on File Prior to Visit  Medication Dose Route Frequency Provider Last Rate Last Dose  . 0.9 %  sodium chloride infusion  500 mL Intravenous Continuous SLadene Artist MD        Medical History:  Past Medical History:  Diagnosis Date  . Colitis 07/11/2012  . Colon polyps 07/11/2012   TUBULOVILLOUS ADENOMA  . Diabetes mellitus   . Exercise-induced asthma    outside asthma   . Hernia   . Hyperlipidemia   . Hypertension   . Iron deficiency   . Kidney stones   . Obesity   . OSA on CPAP   . Vitamin D deficiency     Allergies:  Allergies  Allergen Reactions  . Welchol [Colesevelam Hcl] Hives    HIves     Review of Systems:  Review of Systems  Constitutional: Negative for chills, fever and malaise/fatigue.  HENT: Negative for congestion, ear pain and sore throat.   Eyes: Negative.   Respiratory: Negative for cough, shortness of breath and wheezing.   Cardiovascular: Negative for chest pain, palpitations and leg swelling.   Gastrointestinal: Negative for abdominal pain, blood in stool, constipation, diarrhea, heartburn and melena.  Genitourinary: Negative.   Skin: Negative.   Neurological: Negative for dizziness, sensory change, loss of consciousness and headaches.  Psychiatric/Behavioral: Negative for depression. The patient is not nervous/anxious and does not have insomnia.     Family history- Review and unchanged  Social history- Review and unchanged  Physical Exam: There were no vitals taken for this visit. Wt Readings from Last 3 Encounters:  05/15/17 227 lb (103 kg)  05/10/17 229 lb 9.6 oz (104.1 kg)  05/01/17 227 lb (103 kg)    General Appearance: Morbidly obese,  Well nourished well developed, in no apparent distress. Eyes: PERRLA, EOMs, conjunctiva no swelling or erythema ENT/Mouth: Ear canals normal without obstruction, swelling, erythma, discharge.  TMs normal bilaterally.  Oropharynx moist, clear, without exudate, or postoropharyngeal swelling. Neck: Supple, thyroid normal,no cervical adenopathy  Respiratory: Respiratory effort normal, Breath sounds clear A&P without rhonchi, wheeze, or rale.  No retractions, no accessory usage. Cardio: RRR with no MRGs. Brisk peripheral pulses without edema.  Abdomen: Soft, + BS,  Non tender, no guarding, rebound, hernias, masses. Musculoskeletal: Full ROM, 5/5 strength, Normal gait Skin: Warm, dry without rashes, lesions, ecchymosis.  Neuro: Awake and oriented X 3, Cranial nerves intact. Normal muscle tone, no cerebellar symptoms. Psych: Normal affect, Insight and Judgment appropriate.    Vicie Mutters, PA-C 9:19 AM [facility]

## 2017-07-11 ENCOUNTER — Ambulatory Visit: Payer: Self-pay | Admitting: Physician Assistant

## 2017-08-02 ENCOUNTER — Other Ambulatory Visit: Payer: Self-pay | Admitting: Physician Assistant

## 2017-09-03 ENCOUNTER — Other Ambulatory Visit: Payer: Self-pay | Admitting: Internal Medicine

## 2017-09-03 ENCOUNTER — Other Ambulatory Visit: Payer: Self-pay | Admitting: Physician Assistant

## 2017-09-12 ENCOUNTER — Ambulatory Visit: Payer: Managed Care, Other (non HMO) | Admitting: Internal Medicine

## 2017-09-12 ENCOUNTER — Encounter: Payer: Self-pay | Admitting: Internal Medicine

## 2017-09-12 VITALS — BP 140/88 | HR 80 | Temp 97.3°F | Resp 18 | Ht 65.0 in | Wt 224.8 lb

## 2017-09-12 DIAGNOSIS — Z79899 Other long term (current) drug therapy: Secondary | ICD-10-CM

## 2017-09-12 DIAGNOSIS — N182 Chronic kidney disease, stage 2 (mild): Secondary | ICD-10-CM

## 2017-09-12 DIAGNOSIS — I10 Essential (primary) hypertension: Secondary | ICD-10-CM

## 2017-09-12 DIAGNOSIS — E782 Mixed hyperlipidemia: Secondary | ICD-10-CM | POA: Diagnosis not present

## 2017-09-12 DIAGNOSIS — E1122 Type 2 diabetes mellitus with diabetic chronic kidney disease: Secondary | ICD-10-CM

## 2017-09-12 DIAGNOSIS — Z6837 Body mass index (BMI) 37.0-37.9, adult: Secondary | ICD-10-CM

## 2017-09-12 DIAGNOSIS — E559 Vitamin D deficiency, unspecified: Secondary | ICD-10-CM

## 2017-09-12 MED ORDER — TOPIRAMATE 100 MG PO TABS: ORAL_TABLET | ORAL | 2 refills | Status: DC

## 2017-09-12 MED ORDER — PHENTERMINE HCL 37.5 MG PO TABS: ORAL_TABLET | ORAL | 2 refills | Status: DC

## 2017-09-12 NOTE — Patient Instructions (Signed)

## 2017-09-12 NOTE — Progress Notes (Signed)
This very nice 52 y.o. MWF presents for 3 month follow up with HTN, HLD, Pre-Diabetes and Vitamin D Deficiency.      Patient is treated for HTN (1990) & BP has been controlled at home. Today's BP: 140/88. Patient has had no complaints of any cardiac type chest pain, palpitations, dyspnea / orthopnea / PND, dizziness, claudication, or dependent edema.     Hyperlipidemia is controlled with diet & meds. Patient denies myalgias or other med SE's. Last Lipids were  Lab Results  Component Value Date   CHOL 191 03/30/2017   HDL 44 (L) 03/30/2017   LDLCALC 113 (H) 03/30/2017   TRIG 218 (H) 03/30/2017   CHOLHDL 4.3 03/30/2017      Also, the patient has history of  Gluttony / compulsive overeating and Morbid Obesity (BMI 37+) and consequent T2_NIDDM (2012)  and she has had no symptoms of reactive hypoglycemia, diabetic polys, paresthesias or visual blurring.  She reports random CBG's range betw 110-170 mg% and last A1c was not at goal: Lab Results  Component Value Date   HGBA1C 7.5 (H) 03/30/2017      Further, the patient also has history of Vitamin D Deficiency ("21"/2011) and supplements vitamin D without any suspected side-effects. Last vitamin D was still very low (goal 70-100): Lab Results  Component Value Date   VD25OH 35 07/20/2015   Current Outpatient Medications on File Prior to Visit  Medication Sig  . ALPRAZolam (XANAX) 0.5 MG tablet Take 1/2 to 1 tablet 2 to 3 x / day only if needed for anxiety attack  . aspirin EC 81 MG tablet Take 81 mg by mouth daily.  Marland Kitchen atorvastatin (LIPITOR) 20 MG tablet TAKE 1 TABLET ONCE DAILY.  Marland Kitchen buPROPion (WELLBUTRIN XL) 150 MG 24 hr tablet TAKE 1 TABLET IN THE MORNING.  Marland Kitchen Cholecalciferol (VITAMIN D-3) 5000 UNITS TABS Take by mouth daily.  . cyclobenzaprine (FLEXERIL) 10 MG tablet Take 1 tablet (10 mg total) by mouth at bedtime as needed for muscle spasms.  Marland Kitchen FARXIGA 10 MG TABS tablet TAKE 1 TABLET ONCE DAILY.  Marland Kitchen glucose blood test strip Use as  instructed  . glucose monitoring kit (FREESTYLE) monitoring kit 1 each by Does not apply route as needed for other.  Marland Kitchen JANUVIA 100 MG tablet TAKE 1 TABLET BY MOUTH DAILY.  Marland Kitchen lisinopril (PRINIVIL,ZESTRIL) 20 MG tablet TAKE 1 TABLET BY MOUTH DAILY  . lisinopril-hydrochlorothiazide (PRINZIDE,ZESTORETIC) 20-12.5 MG tablet TAKE 1 TABLET BY MOUTH DAILY.  . Magnesium 500 MG CAPS Take 1 capsule by mouth daily. Taking 911m daily  . meloxicam (MOBIC) 15 MG tablet Take one daily with food for 2 weeks, can take with tylenol, can not take with aleve, iburpofen, then as needed daily for pain  . metFORMIN (GLUCOPHAGE-XR) 500 MG 24 hr tablet TAKE (2) TABLETS TWICE DAILY.  .Marland Kitchenomeprazole (PRILOSEC) 20 MG capsule TAKE 1 CAPSULE EVERY DAY.   Current Facility-Administered Medications on File Prior to Visit  Medication  . 0.9 %  sodium chloride infusion   Allergies  Allergen Reactions  . Welchol [Colesevelam Hcl] Hives    HIves   PMHx:   Past Medical History:  Diagnosis Date  . Colitis 07/11/2012  . Colon polyps 07/11/2012   TUBULOVILLOUS ADENOMA  . Diabetes mellitus   . Exercise-induced asthma    outside asthma   . Hernia   . Hyperlipidemia   . Hypertension   . Iron deficiency   . Kidney stones   . Obesity   .  OSA on CPAP   . Vitamin D deficiency    Immunization History  Administered Date(s) Administered  . Influenza Whole 12/19/2008  . PPD Test 05/23/2013  . Td 03/21/2006   Past Surgical History:  Procedure Laterality Date  . ABDOMINOPLASTY    . ABLATION    . COLONOSCOPY    . COLONOSCOPY W/ BIOPSIES  07/11/2012  . ESOPHAGOGASTRODUODENOSCOPY  07/11/2012   Normal   . HERNIA REPAIR     2017, has had repaired x 3, [place]  . TONSILECTOMY, ADENOIDECTOMY, BILATERAL MYRINGOTOMY AND TUBES     FHx:    Reviewed / unchanged  SHx:    Reviewed / unchanged   Systems Review:  Constitutional: Denies fever, chills, wt changes, headaches, insomnia, fatigue, night sweats, change in appetite. Eyes:  Denies redness, blurred vision, diplopia, discharge, itchy, watery eyes.  ENT: Denies discharge, congestion, post nasal drip, epistaxis, sore throat, earache, hearing loss, dental pain, tinnitus, vertigo, sinus pain, snoring.  CV: Denies chest pain, palpitations, irregular heartbeat, syncope, dyspnea, diaphoresis, orthopnea, PND, claudication or edema. Respiratory: denies cough, dyspnea, DOE, pleurisy, hoarseness, laryngitis, wheezing.  Gastrointestinal: Denies dysphagia, odynophagia, heartburn, reflux, water brash, abdominal pain or cramps, nausea, vomiting, bloating, diarrhea, constipation, hematemesis, melena, hematochezia  or hemorrhoids. Genitourinary: Denies dysuria, frequency, urgency, nocturia, hesitancy, discharge, hematuria or flank pain. Musculoskeletal: Denies arthralgias, myalgias, stiffness, jt. swelling, pain, limping or strain/sprain.  Skin: Denies pruritus, rash, hives, warts, acne, eczema or change in skin lesion(s). Neuro: No weakness, tremor, incoordination, spasms, paresthesia or pain. Psychiatric: Denies confusion, memory loss or sensory loss. Endo: Denies change in weight, skin or hair change.  Heme/Lymph: No excessive bleeding, bruising or enlarged lymph nodes.  Physical Exam  BP 140/88   Pulse 80   Temp (!) 97.3 F (36.3 C)   Resp 18   Ht 5' 5"  (1.651 m)   Wt 224 lb 12.8 oz (102 kg)   BMI 37.41 kg/m   Appears  well nourished, well groomed  and in no distress.  Eyes: PERRLA, EOMs, conjunctiva no swelling or erythema. Sinuses: No frontal/maxillary tenderness ENT/Mouth: EAC's clear, TM's nl w/o erythema, bulging. Nares clear w/o erythema, swelling, exudates. Oropharynx clear without erythema or exudates. Oral hygiene is good. Tongue normal, non obstructing. Hearing intact.  Neck: Supple. Thyroid not palpable. Car 2+/2+ without bruits, nodes or JVD. Chest: Respirations nl with BS clear & equal w/o rales, rhonchi, wheezing or stridor.  Cor: Heart sounds normal w/  regular rate and rhythm without sig. murmurs, gallops, clicks or rubs. Peripheral pulses normal and equal  without edema.  Abdomen: Soft & bowel sounds normal. Non-tender w/o guarding, rebound, hernias, masses or organomegaly.  Lymphatics: Unremarkable.  Musculoskeletal: Full ROM all peripheral extremities, joint stability, 5/5 strength and normal gait.  Skin: Warm, dry without exposed rashes, lesions or ecchymosis apparent.  Neuro: Cranial nerves intact, reflexes equal bilaterally. Sensory-motor testing grossly intact. Tendon reflexes grossly intact.  Pysch: Alert & oriented x 3.  Insight and judgement nl & appropriate. No ideations.  Assessment and Plan:  - Continue medication, monitor blood pressure at home.  - Continue DASH diet.  Reminder to go to the ER if any CP,  SOB, nausea, dizziness, severe HA, changes vision/speech.  - Continue diet/meds, exercise,& lifestyle modifications.  - Continue monitor periodic cholesterol/liver & renal functions   - Continue diet, exercise, lifestyle modifications.  - Monitor appropriate labs. - Continue supplementation.      Discussed  regular exercise, BP monitoring, weight control to achieve/maintain BMI less than  25 and discussed med and SE's. Recommended labs to assess and monitor clinical status with further disposition pending results of labs. Over 30 minutes of exam, counseling, chart review was performed.

## 2017-09-13 LAB — COMPLETE METABOLIC PANEL WITH GFR
AG Ratio: 1.7 (calc) (ref 1.0–2.5)
ALT: 56 U/L — AB (ref 6–29)
AST: 35 U/L (ref 10–35)
Albumin: 4.7 g/dL (ref 3.6–5.1)
Alkaline phosphatase (APISO): 58 U/L (ref 33–130)
BILIRUBIN TOTAL: 0.7 mg/dL (ref 0.2–1.2)
BUN: 14 mg/dL (ref 7–25)
CHLORIDE: 101 mmol/L (ref 98–110)
CO2: 30 mmol/L (ref 20–32)
Calcium: 10.1 mg/dL (ref 8.6–10.4)
Creat: 0.97 mg/dL (ref 0.50–1.05)
GFR, EST AFRICAN AMERICAN: 78 mL/min/{1.73_m2} (ref >=60)
GFR, Est Non African American: 68 mL/min/{1.73_m2} (ref >=60)
GLUCOSE: 133 mg/dL — AB (ref 65–99)
Globulin: 2.7 g/dL (calc) (ref 1.9–3.7)
Potassium: 4 mmol/L (ref 3.5–5.3)
Sodium: 143 mmol/L (ref 135–146)
Total Protein: 7.4 g/dL (ref 6.1–8.1)

## 2017-09-13 LAB — VITAMIN D 25 HYDROXY (VIT D DEFICIENCY, FRACTURES): Vit D, 25-Hydroxy: 50 ng/mL (ref 30–100)

## 2017-09-13 LAB — CBC WITH DIFFERENTIAL/PLATELET
BASOS ABS: 48 {cells}/uL (ref 0–200)
Basophils Relative: 0.7 %
EOS PCT: 2.5 %
Eosinophils Absolute: 173 cells/uL (ref 15–500)
HCT: 44.3 % (ref 35.0–45.0)
Hemoglobin: 14.8 g/dL (ref 11.7–15.5)
Lymphs Abs: 3029 cells/uL (ref 850–3900)
MCH: 28.2 pg (ref 27.0–33.0)
MCHC: 33.4 g/dL (ref 32.0–36.0)
MCV: 84.5 fL (ref 80.0–100.0)
MONOS PCT: 5.2 %
MPV: 9.9 fL (ref 7.5–12.5)
NEUTROS ABS: 3291 {cells}/uL (ref 1500–7800)
NEUTROS PCT: 47.7 %
Platelets: 334 10*3/uL (ref 140–400)
RBC: 5.24 10*6/uL — ABNORMAL HIGH (ref 3.80–5.10)
RDW: 14.1 % (ref 11.0–15.0)
Total Lymphocyte: 43.9 %
WBC mixed population: 359 cells/uL (ref 200–950)
WBC: 6.9 10*3/uL (ref 3.8–10.8)

## 2017-09-13 LAB — LIPID PANEL
CHOL/HDL RATIO: 3.6 (calc) (ref <5.0)
Cholesterol: 174 mg/dL (ref <200)
HDL: 49 mg/dL — ABNORMAL LOW (ref >50)
LDL CHOLESTEROL (CALC): 92 mg/dL
NON-HDL CHOLESTEROL (CALC): 125 mg/dL (ref <130)
Triglycerides: 252 mg/dL — ABNORMAL HIGH (ref <150)

## 2017-09-13 LAB — INSULIN, RANDOM: Insulin: 24 u[IU]/mL — ABNORMAL HIGH (ref 2.0–19.6)

## 2017-09-13 LAB — HEMOGLOBIN A1C
HEMOGLOBIN A1C: 8.2 %{Hb} — AB (ref <5.7)
Mean Plasma Glucose: 189 (calc)
eAG (mmol/L): 10.4 (calc)

## 2017-09-13 LAB — MAGNESIUM: Magnesium: 2.2 mg/dL (ref 1.5–2.5)

## 2017-09-13 LAB — TSH: TSH: 1.27 m[IU]/L

## 2017-10-07 ENCOUNTER — Other Ambulatory Visit: Payer: Self-pay | Admitting: Internal Medicine

## 2017-10-11 ENCOUNTER — Encounter: Payer: Self-pay | Admitting: Physician Assistant

## 2017-11-01 ENCOUNTER — Other Ambulatory Visit: Payer: Self-pay | Admitting: Internal Medicine

## 2018-01-01 ENCOUNTER — Other Ambulatory Visit: Payer: Self-pay | Admitting: Internal Medicine

## 2018-01-03 DIAGNOSIS — Z79899 Other long term (current) drug therapy: Secondary | ICD-10-CM | POA: Insufficient documentation

## 2018-01-03 DIAGNOSIS — E782 Mixed hyperlipidemia: Secondary | ICD-10-CM | POA: Insufficient documentation

## 2018-01-03 DIAGNOSIS — E559 Vitamin D deficiency, unspecified: Secondary | ICD-10-CM | POA: Insufficient documentation

## 2018-01-03 DIAGNOSIS — E1169 Type 2 diabetes mellitus with other specified complication: Secondary | ICD-10-CM | POA: Insufficient documentation

## 2018-01-03 NOTE — Progress Notes (Signed)
Complete Physical  Assessment and Plan:  Encounter for general adult medical examination with abnormal findings - CBC with Differential/Platelet - BASIC METABOLIC PANEL WITH GFR - Hepatic function panel - Magnesium  Essential hypertension - continue medications, DASH diet, exercise and monitor at home. Call if greater than 130/80.  - Urinalysis, Routine w reflex microscopic (not at [facility]) - Microalbumin / creatinine urine ratio - EKG 12-Lead - TSH  Type 2 diabetes mellitus with other diabetic kidney complication, without long-term current use of insulin ([place]) Discussed general issues about diabetes pathophysiology and management., Educational material distributed., Suggested low cholesterol diet., Encouraged aerobic exercise., Discussed foot care., Reminded to get yearly retinal exam. - Hemoglobin A1c   Iron deficiency anemia, unspecified - Iron and TIBC - Vitamin B12  Fatty liver Check labs, avoid tylenol, alcohol, weight loss advised.   Transaminitis -due to fatty liver   Obesity - - follow up 3 months for progress monitoring - long discussion about weight loss, diet, and exercise, will start the patient on phentermine- hand out given and AE's discussed, will do close follow up.   Hyperlipidemia -cont pravastatin - Lipid panel  Vitamin D deficiency -cont supplement - VITAMIN D 25 Hydroxy (Vit-D Deficiency, Fractures)  Discussed med's effects and SE's. Screening labs and tests as requested with regular follow-up as recommended.  HPI  52 y.o. female  presents for a complete physical.  Her blood pressure has been controlled at home, today their BP is BP: 136/78.  She does not workout. She denies chest pain, shortness of breath, dizziness.  Her mom is now in nursing home and is in hospice, this is less stress for her.   She is on cholesterol medication and denies myalgias. Her cholesterol is not at goal. The cholesterol last visit was:  Lab Results  Component Value  Date   CHOL 174 09/12/2017   HDL 49 (L) 09/12/2017   LDLCALC 92 09/12/2017   TRIG 252 (H) 09/12/2017   CHOLHDL 3.6 09/12/2017  . She has been working on diet and exercise for diabetes with CKD, she is not on bASA, she is on ACE/ARB and denies foot ulcerations, hyperglycemia, hypoglycemia , increased appetite, nausea, paresthesia of the feet, polydipsia, polyuria, visual disturbances, vomiting and weight loss. Last A1C in the office was:  Lab Results  Component Value Date   HGBA1C 8.2 (H) 09/12/2017    Lab Results  Component Value Date   GFRNONAA 68 09/12/2017   Patient is on Vitamin D supplement.   Lab Results  Component Value Date   VD25OH 50 09/12/2017     BMI is Body mass index is 36.54 kg/m., she is working on diet and exercise. She is on phentermine and has had some weight loss, she has not started the topamax.  Wt Readings from Last 3 Encounters:  01/08/18 219 lb 9.6 oz (99.6 kg)  09/12/17 224 lb 12.8 oz (102 kg)  05/15/17 227 lb (103 kg)     Current Medications:  Current Outpatient Medications on File Prior to Visit  Medication Sig Dispense Refill  . ALPRAZolam (XANAX) 0.5 MG tablet Take 1/2 to 1 tablet 2 to 3 x / day only if needed for anxiety attack 60 tablet 0  . aspirin EC 81 MG tablet Take 81 mg by mouth daily.    Marland Kitchen atorvastatin (LIPITOR) 20 MG tablet TAKE 1 TABLET ONCE DAILY. 30 tablet 0  . buPROPion (WELLBUTRIN XL) 150 MG 24 hr tablet TAKE 1 TABLET IN THE MORNING. 30 tablet 0  .  Cholecalciferol (VITAMIN D-3) 5000 UNITS TABS Take by mouth daily.    . cyclobenzaprine (FLEXERIL) 10 MG tablet Take 1 tablet (10 mg total) by mouth at bedtime as needed for muscle spasms. 90 tablet 0  . FARXIGA 10 MG TABS tablet TAKE 1 TABLET ONCE DAILY. 30 tablet 0  . glucose blood test strip Use as instructed 100 each 12  . glucose monitoring kit (FREESTYLE) monitoring kit 1 each by Does not apply route as needed for other. 1 each 0  . JANUVIA 100 MG tablet TAKE 1 TABLET BY MOUTH  DAILY. 30 tablet 0  . lisinopril (PRINIVIL,ZESTRIL) 20 MG tablet TAKE 1 TABLET BY MOUTH EVERY DAY 90 tablet 1  . lisinopril-hydrochlorothiazide (PRINZIDE,ZESTORETIC) 20-12.5 MG tablet TAKE 1 TABLET BY MOUTH DAILY. 30 tablet 0  . Magnesium 500 MG CAPS Take 1 capsule by mouth daily. Taking 957m daily    . meloxicam (MOBIC) 15 MG tablet Take one daily with food for 2 weeks, can take with tylenol, can not take with aleve, iburpofen, then as needed daily for pain 30 tablet 1  . metFORMIN (GLUCOPHAGE-XR) 500 MG 24 hr tablet TAKE (2) TABLETS TWICE DAILY. 120 tablet 0  . omeprazole (PRILOSEC) 20 MG capsule TAKE 1 CAPSULE EVERY DAY. 30 capsule 0  . phentermine (ADIPEX-P) 37.5 MG tablet Take 1/2 to 1 tablet every morning for dieting & weight loss 30 tablet 2  . topiramate (TOPAMAX) 100 MG tablet Take 1 tablet 1 hour before sleep 30 tablet 2   Current Facility-Administered Medications on File Prior to Visit  Medication Dose Route Frequency Provider Last Rate Last Dose  . 0.9 %  sodium chloride infusion  500 mL Intravenous Continuous SLadene Artist MD        Health Maintenance:   Immunization History  Administered Date(s) Administered  . Influenza Whole 12/19/2008  . PPD Test 05/23/2013  . Td 03/21/2006   Tetanus: 2008 DUE  Flu vaccine: 2016 LTSV:[id number] ablation, no further periods.   Pap: 2017 MGM: 2018 DUE will see Dr. AOuida SillsDEXA: Not due Colonoscopy: 04/2017 EGD: 2014 Last Dental Exam: Dr. WJudeth [facility]Last [facility]    Patient Care Team: MUnk Pinto MD as PCP - General (Internal Medicine) SLadene Artist MD as Consulting Physician (Gastroenterology) RRemer Macho MD as Attending Physician (Ophthalmology) ALenore Cordia MD (Obstetrics and Gynecology) RFrederik Pear MD as Consulting Physician (Orthopedic Surgery) WRolan Bucco MD as Consulting Physician (Urology) FSherlyn Lick MD (Dermatology)   Medical History:  Past Medical  History:  Diagnosis Date  . Colitis 07/11/2012  . Colon polyps 07/11/2012   TUBULOVILLOUS ADENOMA  . Diabetes mellitus   . Exercise-induced asthma    outside asthma   . Hernia   . Hyperlipidemia   . Hypertension   . Iron deficiency   . Kidney stones   . Obesity   . OSA on CPAP   . Vitamin D deficiency    Allergies Allergies  Allergen Reactions  . Welchol [Colesevelam Hcl] Hives    HIves    SURGICAL HISTORY She  has a past surgical history that includes Tonsilectomy, adenoidectomy, bilateral myringotomy and tubes; Ablation; Abdominoplasty; Esophagogastroduodenoscopy (07/11/2012); Colonoscopy w/ biopsies (07/11/2012); Hernia repair; and Colonoscopy. FAMILY HISTORY Her family history includes Colon cancer (age of onset: 744 in her father. She was adopted. SOCIAL HISTORY She  reports that she quit smoking about 32 years ago. She has a 2.00 pack-year smoking history. She has never used smokeless tobacco. She  reports that she drinks alcohol. She reports that she does not use drugs.  Review of Systems: Review of Systems  Constitutional: Negative for chills, fever and malaise/fatigue.  HENT: Negative for congestion, ear pain and sore throat.   Eyes: Negative.   Respiratory: Negative for cough, shortness of breath and wheezing.   Cardiovascular: Negative for chest pain, palpitations and leg swelling.  Gastrointestinal: Negative for abdominal pain, blood in stool, constipation, diarrhea, heartburn and melena.  Genitourinary: Negative.   Skin: Negative.   Neurological: Negative for dizziness, sensory change, loss of consciousness and headaches.  Psychiatric/Behavioral: Negative for depression. The patient is not nervous/anxious and does not have insomnia.     Physical Exam: Estimated body mass index is 36.54 kg/m as calculated from the following:   Height as of this encounter: 5' 5"  (1.651 m).   Weight as of this encounter: 219 lb 9.6 oz (99.6 kg). BP 136/78   Pulse 88   Temp  97.7 F (36.5 C)   Resp 14   Ht 5' 5"  (1.651 m)   Wt 219 lb 9.6 oz (99.6 kg)   SpO2 97%   BMI 36.54 kg/m   General Appearance: Well nourished well developed, in no apparent distress.  Eyes: PERRLA, EOMs, conjunctiva no swelling or erythema ENT/Mouth: Ear canals normal without obstruction, swelling, erythema, or discharge.  TMs normal bilaterally with no erythema, bulging, retraction, or loss of landmark.  Oropharynx moist and clear with no exudate, erythema, or swelling.   Neck: Supple, thyroid normal. No bruits.  No cervical adenopathy Respiratory: Respiratory effort normal, Breath sounds clear A&P without wheeze, rhonchi, rales.   Cardio: RRR without murmurs, rubs or gallops. Brisk peripheral pulses without edema.  Chest: symmetric, with normal excursions Breasts: Deferred to obgyn Abdomen: Soft, nontender, no guarding, rebound, mild bulging superior of periumbilical region without defined hernia noted on exam.  Exam difficult secondary to body habitus, masses, or organomegaly.  Lymphatics: Non tender without lymphadenopathy.  Genitourinary: Deferred to Obgyn Musculoskeletal: Full ROM all peripheral extremities,5/5 strength, and normal gait.  Skin: Warm, dry without rashes, lesions, ecchymosis. Neuro: Awake and oriented X 3, Cranial nerves intact, reflexes equal bilaterally. Normal muscle tone, no cerebellar symptoms. Sensation intact.  Psych:  normal affect, Insight and Judgment appropriate.   EKG: WNL no changes.  AORTA SCAN: defer  Over 40 minutes of exam, counseling, chart review and critical decision making was performed  Vicie Mutters 2:17 PM [facility]

## 2018-01-06 ENCOUNTER — Other Ambulatory Visit: Payer: Self-pay | Admitting: Physician Assistant

## 2018-01-08 ENCOUNTER — Ambulatory Visit (INDEPENDENT_AMBULATORY_CARE_PROVIDER_SITE_OTHER): Payer: Managed Care, Other (non HMO) | Admitting: Physician Assistant

## 2018-01-08 ENCOUNTER — Encounter: Payer: Self-pay | Admitting: Physician Assistant

## 2018-01-08 VITALS — BP 136/78 | HR 88 | Temp 97.7°F | Resp 14 | Ht 65.0 in | Wt 219.6 lb

## 2018-01-08 DIAGNOSIS — Z131 Encounter for screening for diabetes mellitus: Secondary | ICD-10-CM

## 2018-01-08 DIAGNOSIS — Z79899 Other long term (current) drug therapy: Secondary | ICD-10-CM

## 2018-01-08 DIAGNOSIS — Z1329 Encounter for screening for other suspected endocrine disorder: Secondary | ICD-10-CM

## 2018-01-08 DIAGNOSIS — I10 Essential (primary) hypertension: Secondary | ICD-10-CM

## 2018-01-08 DIAGNOSIS — R740 Nonspecific elevation of levels of transaminase and lactic acid dehydrogenase [LDH]: Secondary | ICD-10-CM

## 2018-01-08 DIAGNOSIS — R7401 Elevation of levels of liver transaminase levels: Secondary | ICD-10-CM

## 2018-01-08 DIAGNOSIS — Z6837 Body mass index (BMI) 37.0-37.9, adult: Secondary | ICD-10-CM

## 2018-01-08 DIAGNOSIS — Z136 Encounter for screening for cardiovascular disorders: Secondary | ICD-10-CM | POA: Diagnosis not present

## 2018-01-08 DIAGNOSIS — E782 Mixed hyperlipidemia: Secondary | ICD-10-CM

## 2018-01-08 DIAGNOSIS — J45990 Exercise induced bronchospasm: Secondary | ICD-10-CM

## 2018-01-08 DIAGNOSIS — K760 Fatty (change of) liver, not elsewhere classified: Secondary | ICD-10-CM

## 2018-01-08 DIAGNOSIS — Z0000 Encounter for general adult medical examination without abnormal findings: Secondary | ICD-10-CM

## 2018-01-08 DIAGNOSIS — E559 Vitamin D deficiency, unspecified: Secondary | ICD-10-CM

## 2018-01-08 DIAGNOSIS — Z13220 Encounter for screening for lipoid disorders: Secondary | ICD-10-CM

## 2018-01-08 DIAGNOSIS — Z0001 Encounter for general adult medical examination with abnormal findings: Secondary | ICD-10-CM

## 2018-01-08 DIAGNOSIS — Z1389 Encounter for screening for other disorder: Secondary | ICD-10-CM

## 2018-01-08 DIAGNOSIS — Z130 Encounter for screening for diseases of the blood and blood-forming organs and certain disorders involving the immune mechanism: Secondary | ICD-10-CM

## 2018-01-08 DIAGNOSIS — G4733 Obstructive sleep apnea (adult) (pediatric): Secondary | ICD-10-CM

## 2018-01-08 DIAGNOSIS — D509 Iron deficiency anemia, unspecified: Secondary | ICD-10-CM

## 2018-01-08 DIAGNOSIS — E1129 Type 2 diabetes mellitus with other diabetic kidney complication: Secondary | ICD-10-CM

## 2018-01-08 MED ORDER — PHENTERMINE HCL 37.5 MG PO TABS: ORAL_TABLET | ORAL | 2 refills | Status: DC

## 2018-01-08 NOTE — Patient Instructions (Signed)
GETTING OFF OF PPI's    Nexium/protonix/prilosec/Omeprazole/Dexilant/Aciphex are called PPI's, they are great at healing your stomach but should only be taken for a short period of time.     Recent studies have shown that taken for a long time they  can increase the risk of osteoporosis (weakening of your bones), pneumonia, low magnesium, restless legs, Cdiff (infection that causes diarrhea), DEMENTIA and most recently kidney damage / disease / insufficiency.     Due to this information we want to try to stop the PPI but if you try to stop it abruptly this can cause rebound acid and worsening symptoms.   So this is how we want you to get off the PPI: Generic is always fine!!  - Start taking the nexium/protonix/prilosec/PPI  every other day with  zantac (ranitidine) OR pepcid famotadine 2 x a day for 2-4 weeks - some people stay on this dosage and can not taper off further. Our main goal is to limit the dosage and amount you are taking so if you need to stay on this dose.   - then decrease the PPI to every 3 days while taking the zantac or pepcid 300mg  twice a day the other  days for 2-4  Weeks  - then you can try the zantac or pepcid 300mg  once at night or up to 2 x day as needed.  - you can continue on this once at night or stop all together  - Avoid alcohol, spicy foods, NSAIDS (aleve, ibuprofen) at this time. See foods below.   +++++++++++++++++++++++++++++++++++++++++++  Food Choices for Gastroesophageal Reflux Disease  When you have gastroesophageal reflux disease (GERD), the foods you eat and your eating habits are very important. Choosing the right foods can help ease the discomfort of GERD. WHAT GENERAL GUIDELINES DO I NEED TO FOLLOW?  Choose fruits, vegetables, whole grains, low-fat dairy products, and low-fat meat, fish, and poultry.  Limit fats such as oils, salad dressings, butter, nuts, and avocado.  Keep a food diary to identify foods that cause symptoms.  Avoid foods  that cause reflux. These may be different for different people.  Eat frequent small meals instead of three large meals each day.  Eat your meals slowly, in a relaxed setting.  Limit fried foods.  Cook foods using methods other than frying.  Avoid drinking alcohol.  Avoid drinking large amounts of liquids with your meals.  Avoid bending over or lying down until 2-3 hours after eating.   WHAT FOODS ARE NOT RECOMMENDED? The following are some foods and drinks that may worsen your symptoms:  Vegetables Tomatoes. Tomato juice. Tomato and spaghetti sauce. Chili peppers. Onion and garlic. Horseradish. Fruits Oranges, grapefruit, and lemon (fruit and juice). Meats High-fat meats, fish, and poultry. This includes hot dogs, ribs, ham, sausage, salami, and bacon. Dairy Whole milk and chocolate milk. Sour cream. Cream. Butter. Ice cream. Cream cheese.  Beverages Coffee and tea, with or without caffeine. Carbonated beverages or energy drinks. Condiments Hot sauce. Barbecue sauce.  Sweets/Desserts Chocolate and cocoa. Donuts. Peppermint and spearmint. Fats and Oils High-fat foods, including [place] fries and potato chips. Other Vinegar. Strong spices, such as black pepper, white pepper, red pepper, cayenne, curry powder, cloves, ginger, and chili powder.       When it comes to diets, agreement about the perfect plan isn't easy to find, even among the experts. Experts at the [employer] developed an idea known as the Healthy Eating Plate. Just imagine a plate divided  into logical, healthy portions.  The emphasis is on diet quality:  Load up on vegetables and fruits - one-half of your plate: Aim for color and variety, and remember that potatoes don't count.  Go for whole grains - one-quarter of your plate: Whole wheat, barley, wheat berries, quinoa, oats, brown rice, and foods made with them. If you want pasta, go with whole wheat pasta.  Protein power -  one-quarter of your plate: Fish, chicken, beans, and nuts are all healthy, versatile protein sources. Limit red meat.  The diet, however, does go beyond the plate, offering a few other suggestions.  Use healthy plant oils, such as olive, canola, soy, corn, sunflower and peanut. Check the labels, and avoid partially hydrogenated oil, which have unhealthy trans fats.  If you're thirsty, drink water. Coffee and tea are good in moderation, but skip sugary drinks and limit milk and dairy products to one or two daily servings.  The type of carbohydrate in the diet is more important than the amount. Some sources of carbohydrates, such as vegetables, fruits, whole grains, and beans-are healthier than others.  Finally, stay active.

## 2018-01-09 LAB — COMPLETE METABOLIC PANEL WITH GFR
AG Ratio: 1.9 (calc) (ref 1.0–2.5)
ALKALINE PHOSPHATASE (APISO): 60 U/L (ref 33–130)
ALT: 35 U/L — AB (ref 6–29)
AST: 20 U/L (ref 10–35)
Albumin: 4.6 g/dL (ref 3.6–5.1)
BUN: 17 mg/dL (ref 7–25)
CO2: 31 mmol/L (ref 20–32)
CREATININE: 0.99 mg/dL (ref 0.50–1.05)
Calcium: 10.1 mg/dL (ref 8.6–10.4)
Chloride: 101 mmol/L (ref 98–110)
GFR, Est African American: 76 mL/min/{1.73_m2} (ref >=60)
GFR, Est Non African American: 66 mL/min/{1.73_m2} (ref >=60)
GLOBULIN: 2.4 g/dL (ref 1.9–3.7)
GLUCOSE: 135 mg/dL — AB (ref 65–99)
Potassium: 4.2 mmol/L (ref 3.5–5.3)
SODIUM: 143 mmol/L (ref 135–146)
Total Bilirubin: 0.5 mg/dL (ref 0.2–1.2)
Total Protein: 7 g/dL (ref 6.1–8.1)

## 2018-01-09 LAB — CBC WITH DIFFERENTIAL/PLATELET
BASOS ABS: 63 {cells}/uL (ref 0–200)
Basophils Relative: 0.9 %
EOS ABS: 217 {cells}/uL (ref 15–500)
EOS PCT: 3.1 %
HEMATOCRIT: 42.8 % (ref 35.0–45.0)
HEMOGLOBIN: 14.3 g/dL (ref 11.7–15.5)
LYMPHS ABS: 2723 {cells}/uL (ref 850–3900)
MCH: 28.3 pg (ref 27.0–33.0)
MCHC: 33.4 g/dL (ref 32.0–36.0)
MCV: 84.6 fL (ref 80.0–100.0)
MPV: 9.8 fL (ref 7.5–12.5)
Monocytes Relative: 5.1 %
NEUTROS ABS: 3640 {cells}/uL (ref 1500–7800)
NEUTROS PCT: 52 %
Platelets: 337 10*3/uL (ref 140–400)
RBC: 5.06 10*6/uL (ref 3.80–5.10)
RDW: 13.5 % (ref 11.0–15.0)
Total Lymphocyte: 38.9 %
WBC mixed population: 357 cells/uL (ref 200–950)
WBC: 7 10*3/uL (ref 3.8–10.8)

## 2018-01-09 LAB — IRON, TOTAL/TOTAL IRON BINDING CAP
%SAT: 19 % (ref 16–45)
IRON: 72 ug/dL (ref 45–160)
TIBC: 389 ug/dL (ref 250–450)

## 2018-01-09 LAB — LIPID PANEL
CHOLESTEROL: 176 mg/dL (ref <200)
HDL: 49 mg/dL — ABNORMAL LOW (ref >50)
LDL CHOLESTEROL (CALC): 98 mg/dL
Non-HDL Cholesterol (Calc): 127 mg/dL (calc) (ref <130)
Total CHOL/HDL Ratio: 3.6 (calc) (ref <5.0)
Triglycerides: 203 mg/dL — ABNORMAL HIGH (ref <150)

## 2018-01-09 LAB — VITAMIN D 25 HYDROXY (VIT D DEFICIENCY, FRACTURES): Vit D, 25-Hydroxy: 58 ng/mL (ref 30–100)

## 2018-01-09 LAB — URINALYSIS, ROUTINE W REFLEX MICROSCOPIC
BILIRUBIN URINE: NEGATIVE
Hgb urine dipstick: NEGATIVE
KETONES UR: NEGATIVE
Leukocytes, UA: NEGATIVE
Nitrite: NEGATIVE
Protein, ur: NEGATIVE
Specific Gravity, Urine: 1.031 (ref 1.001–1.03)
pH: 5.5 (ref 5.0–8.0)

## 2018-01-09 LAB — MICROALBUMIN / CREATININE URINE RATIO
CREATININE, URINE: 70 mg/dL (ref 20–275)
Microalb Creat Ratio: 4 mcg/mg creat (ref <30)
Microalb, Ur: 0.3 mg/dL

## 2018-01-09 LAB — HEMOGLOBIN A1C
Hgb A1c MFr Bld: 7.3 % of total Hgb — ABNORMAL HIGH (ref <5.7)
MEAN PLASMA GLUCOSE: 163 (calc)
eAG (mmol/L): 9 (calc)

## 2018-01-09 LAB — TSH: TSH: 1.31 m[IU]/L

## 2018-01-09 LAB — VITAMIN B12: Vitamin B-12: 333 pg/mL (ref 200–1100)

## 2018-01-09 LAB — MAGNESIUM: Magnesium: 2 mg/dL (ref 1.5–2.5)

## 2018-02-05 ENCOUNTER — Other Ambulatory Visit: Payer: Self-pay | Admitting: Physician Assistant

## 2018-02-05 ENCOUNTER — Other Ambulatory Visit: Payer: Self-pay | Admitting: Internal Medicine

## 2018-02-26 ENCOUNTER — Other Ambulatory Visit: Payer: Self-pay | Admitting: Physician Assistant

## 2018-02-26 MED ORDER — PREDNISONE 20 MG PO TABS: ORAL_TABLET | ORAL | 0 refills | Status: DC

## 2018-02-26 MED ORDER — FLUTICASONE PROPIONATE 50 MCG/ACT NA SUSP: 2.0000 | Freq: Every day | NASAL | 1 refills | Status: DC

## 2018-02-26 MED ORDER — BENZONATATE 200 MG PO CAPS: 200.0000 mg | ORAL_CAPSULE | Freq: Three times a day (TID) | ORAL | 0 refills | Status: DC | PRN

## 2018-03-05 ENCOUNTER — Other Ambulatory Visit: Payer: Self-pay | Admitting: Physician Assistant

## 2018-04-09 ENCOUNTER — Other Ambulatory Visit: Payer: Self-pay | Admitting: Physician Assistant

## 2018-04-13 NOTE — Progress Notes (Signed)
Assessment and Plan:   Essential hypertension - continue medications, DASH diet, exercise and monitor at home. Call if greater than 130/80.  -     CBC with Differential/Platelet -     BASIC METABOLIC PANEL WITH GFR -     Hepatic function panel -     TSH  Fatty liver LONG discussion about cirrhosis and fatty liver -     Hepatic function panel   Type 2 diabetes mellitus with other diabetic kidney complication, without long-term current use of insulin ([place]) Discussed general issues about diabetes pathophysiology and management., Educational material distributed., Suggested low cholesterol diet., Encouraged aerobic exercise., Discussed foot care., Reminded to get yearly retinal exam. Will get eye exam Will continue farxiga Will bring food log -     Hemoglobin A1c  Class 2 severe obesity due to excess calories with serious comorbidity and body mass index (BMI) of 37.0 to 37.9 in adult [facility]) - follow up 1 months for progress monitoring - increase veggies, decrease carbs - long discussion about weight loss, diet, and exercise - bring food log  Mixed hyperlipidemia -continue medications, check lipids, decrease fatty foods, increase activity.  Not at goal of less than 70, recheck today but may need to increase dose -     Lipid panel  Medication management -     Magnesium  Continue diet and meds as discussed. Further disposition pending results of labs.  HPI 53 y.o. female  presents for 3 month follow up with hypertension, hyperlipidemia, diabetes and vitamin D.   Her blood pressure has been controlled at home, today their BP is BP: (!) 142/90 She does not workout. She denies chest pain, shortness of breath, dizziness.     She is on cholesterol medication, on pravastatin and denies myalgias. Her cholesterol is not at goal. The cholesterol last visit was:   Lab Results  Component Value Date   CHOL 176 01/08/2018   HDL 49 (L) 01/08/2018   LDLCALC 98 01/08/2018   TRIG 203 (H)  01/08/2018   CHOLHDL 3.6 01/08/2018    She has been working on diet and exercise for diabetes with CKD, she was on invokana but insurance was not covering, she wants to try jardiance, she is on MF and , and denies foot ulcerations, hyperglycemia, hypoglycemia , increased appetite, nausea, paresthesia of the feet, polydipsia, polyuria, visual disturbances, vomiting and weight loss. Last A1C in the office was:  Lab Results  Component Value Date   HGBA1C 7.3 (H) 01/08/2018   Lab Results  Component Value Date   GFRNONAA 66 01/08/2018   Patient is on Vitamin D supplement.  Lab Results  Component Value Date   VD25OH 69 01/08/2018     She continues to have stress and worry about her mother.  She reports that the insurance company promised her home health would be able to do more than they actually can.  She is still unable to convince her to go to a nursing home. BMI is Body mass index is 36.58 kg/m., she is working on diet and exercise. Wt Readings from Last 3 Encounters:  04/16/18 219 lb 12.8 oz (99.7 kg)  01/08/18 219 lb 9.6 oz (99.6 kg)  09/12/17 224 lb 12.8 oz (102 kg)      Current Medications:  Current Outpatient Medications on File Prior to Visit  Medication Sig Dispense Refill  . ALPRAZolam (XANAX) 0.5 MG tablet Take 1/2 to 1 tablet 2 to 3 x / day only if needed for anxiety attack  60 tablet 0  . aspirin EC 81 MG tablet Take 81 mg by mouth daily.    Marland Kitchen atorvastatin (LIPITOR) 20 MG tablet TAKE 1 TABLET ONCE DAILY. 30 tablet 0  . buPROPion (WELLBUTRIN XL) 150 MG 24 hr tablet TAKE 1 TABLET IN THE MORNING. 30 tablet 0  . Cholecalciferol (VITAMIN D-3) 5000 UNITS TABS Take by mouth daily.    . cyclobenzaprine (FLEXERIL) 10 MG tablet Take 1 tablet (10 mg total) by mouth at bedtime as needed for muscle spasms. 90 tablet 0  . FARXIGA 10 MG TABS tablet TAKE 1 TABLET ONCE DAILY. 30 tablet 0  . fluticasone (FLONASE) 50 MCG/ACT nasal spray Place 2 sprays into both nostrils at bedtime. 16 g 1   . glucose blood test strip Use as instructed 100 each 12  . glucose monitoring kit (FREESTYLE) monitoring kit 1 each by Does not apply route as needed for other. 1 each 0  . JANUVIA 100 MG tablet TAKE 1 TABLET BY MOUTH DAILY. 90 tablet 1  . lisinopril (PRINIVIL,ZESTRIL) 20 MG tablet TAKE 1 TABLET BY MOUTH EVERY DAY 90 tablet 1  . lisinopril-hydrochlorothiazide (PRINZIDE,ZESTORETIC) 20-12.5 MG tablet TAKE 1 TABLET BY MOUTH DAILY. 30 tablet 0  . Magnesium 500 MG CAPS Take 1 capsule by mouth daily. Taking 943m daily    . meloxicam (MOBIC) 15 MG tablet Take one daily with food for 2 weeks, can take with tylenol, can not take with aleve, iburpofen, then as needed daily for pain 30 tablet 1  . metFORMIN (GLUCOPHAGE-XR) 500 MG 24 hr tablet TAKE (2) TABLETS TWICE DAILY. 120 tablet 0  . omeprazole (PRILOSEC) 20 MG capsule TAKE 1 CAPSULE EVERY DAY. 30 capsule 0  . phentermine (ADIPEX-P) 37.5 MG tablet Take 1/2 to 1 tablet every morning for dieting & weight loss 30 tablet 2  . topiramate (TOPAMAX) 100 MG tablet Take 1 tablet 1 hour before sleep 30 tablet 2   Current Facility-Administered Medications on File Prior to Visit  Medication Dose Route Frequency Provider Last Rate Last Dose  . 0.9 %  sodium chloride infusion  500 mL Intravenous Continuous SLadene Artist MD        Medical History:  Past Medical History:  Diagnosis Date  . Colitis 07/11/2012  . Colon polyps 07/11/2012   TUBULOVILLOUS ADENOMA  . Diabetes mellitus   . Exercise-induced asthma    outside asthma   . Hernia   . Hyperlipidemia   . Hypertension   . Iron deficiency   . Kidney stones   . Obesity   . OSA on CPAP   . Vitamin D deficiency     Allergies:  Allergies  Allergen Reactions  . Welchol [Colesevelam Hcl] Hives    HIves     Review of Systems:  Review of Systems  Constitutional: Negative for chills, fever and malaise/fatigue.  HENT: Negative for congestion, ear pain and sore throat.   Eyes: Negative.    Respiratory: Negative for cough, shortness of breath and wheezing.   Cardiovascular: Negative for chest pain, palpitations and leg swelling.  Gastrointestinal: Negative for abdominal pain, blood in stool, constipation, diarrhea, heartburn and melena.  Genitourinary: Negative.   Skin: Negative.   Neurological: Negative for dizziness, sensory change, loss of consciousness and headaches.  Psychiatric/Behavioral: Negative for depression. The patient is not nervous/anxious and does not have insomnia.     Family history- Review and unchanged  Social history- Review and unchanged  Physical Exam: BP (!) 142/90   Pulse 83  Temp 98.2 F (36.8 C)   Ht _0  (1.651 m)   Wt 219 lb 12.8 oz (99.7 kg)   SpO2 98%   BMI 36.58 kg/m  Wt Readings from Last 3 Encounters:  04/16/18 219 lb 12.8 oz (99.7 kg)  01/08/18 219 lb 9.6 oz (99.6 kg)  09/12/17 224 lb 12.8 oz (102 kg)    General Appearance: Morbidly obese, Well nourished well developed, in no apparent distress. Eyes: PERRLA, EOMs, conjunctiva no swelling or erythema ENT/Mouth: Ear canals normal without obstruction, swelling, erythma, discharge.  TMs normal bilaterally.  Oropharynx moist, clear, without exudate, or postoropharyngeal swelling. Neck: Supple, thyroid normal,no cervical adenopathy  Respiratory: Respiratory effort normal, Breath sounds clear A&P without rhonchi, wheeze, or rale.  No retractions, no accessory usage. Cardio: RRR with no MRGs. Brisk peripheral pulses without edema.  Abdomen: Soft, + BS,  Non tender, no guarding, rebound, hernias, masses. Musculoskeletal: Full ROM, 5/5 strength, Normal gait Skin: Warm, dry without rashes, lesions, ecchymosis.  Neuro: Awake and oriented X 3, Cranial nerves intact. Normal muscle tone, no cerebellar symptoms. Psych: Normal affect, Insight and Judgment appropriate.    Vicie Mutters, PA-C 4:45 PM [facility]

## 2018-04-16 ENCOUNTER — Ambulatory Visit: Payer: Managed Care, Other (non HMO) | Admitting: Physician Assistant

## 2018-04-16 ENCOUNTER — Encounter: Payer: Self-pay | Admitting: Physician Assistant

## 2018-04-16 VITALS — BP 142/90 | HR 83 | Temp 98.2°F | Ht 65.0 in | Wt 219.8 lb

## 2018-04-16 DIAGNOSIS — E782 Mixed hyperlipidemia: Secondary | ICD-10-CM | POA: Diagnosis not present

## 2018-04-16 DIAGNOSIS — D509 Iron deficiency anemia, unspecified: Secondary | ICD-10-CM | POA: Diagnosis not present

## 2018-04-16 DIAGNOSIS — Z79899 Other long term (current) drug therapy: Secondary | ICD-10-CM | POA: Diagnosis not present

## 2018-04-16 DIAGNOSIS — I10 Essential (primary) hypertension: Secondary | ICD-10-CM

## 2018-04-16 DIAGNOSIS — E1129 Type 2 diabetes mellitus with other diabetic kidney complication: Secondary | ICD-10-CM

## 2018-04-16 NOTE — Patient Instructions (Signed)
WATER IS IMPORTANT  Being dehydrated can hurt your kidneys, cause fatigue, headaches, muscle aches, joint pain, and dry skin/nails so please increase your fluids.   Drink 80-100 oz a day of water, measure it out! Eat 3 meals a day, have to do breakfast, eat protein- hard boiled eggs, protein bar like nature valley protein bar, greek yogurt like oikos triple zero, chobani 100, or light n fit greek  Can check out plantnanny app on your phone to help you keep track of your water         When it comes to diets, agreement about the perfect plan isn't easy to find, even among the experts. Experts at the [employer] developed an idea known as the Healthy Eating Plate. Just imagine a plate divided into logical, healthy portions.  The emphasis is on diet quality:  Load up on vegetables and fruits - one-half of your plate: Aim for color and variety, and remember that potatoes don't count.  Go for whole grains - one-quarter of your plate: Whole wheat, barley, wheat berries, quinoa, oats, brown rice, and foods made with them. If you want pasta, go with whole wheat pasta.  Protein power - one-quarter of your plate: Fish, chicken, beans, and nuts are all healthy, versatile protein sources. Limit red meat.  The diet, however, does go beyond the plate, offering a few other suggestions.  Use healthy plant oils, such as olive, canola, soy, corn, sunflower and peanut. Check the labels, and avoid partially hydrogenated oil, which have unhealthy trans fats.  If you're thirsty, drink water. Coffee and tea are good in moderation, but skip sugary drinks and limit milk and dairy products to one or two daily servings.  The type of carbohydrate in the diet is more important than the amount. Some sources of carbohydrates, such as vegetables, fruits, whole grains, and beans-are healthier than others.  Finally, stay active.  HYPERTENSION INFORMATION  Monitor your blood pressure at home,  please keep a record and bring that in with you to your next office visit.   Go to the ER if any CP, SOB, nausea, dizziness, severe HA, changes vision/speech  Due to a recent study, SPRINT, we have changed our goal for the systolic or top blood pressure number. Ideally we want your top number at 120.  In the [facility] Trial, 5000 people were randomized to a goal BP of 120 and 5000 people were randomized to a goal BP of less than 140. The patients with the goal BP at 120 had LESS DEMENTIA, LESS HEART ATTACKS, AND LESS STROKES, AS WELL AS OVERALL DECREASED MORTALITY OR DEATH RATE.   If you are willing, our goal BP is the top number of 120.  Your most recent BP: BP: (!) 142/90   Take your medications faithfully as instructed. Maintain a healthy weight. Get at least 150 minutes of aerobic exercise per week. Minimize salt intake. Minimize alcohol intake  DASH Eating Plan DASH stands for "Dietary Approaches to Stop Hypertension." The DASH eating plan is a healthy eating plan that has been shown to reduce high blood pressure (hypertension). Additional health benefits may include reducing the risk of type 2 diabetes mellitus, heart disease, and stroke. The DASH eating plan may also help with weight loss. WHAT DO I NEED TO KNOW ABOUT THE DASH EATING PLAN? For the DASH eating plan, you will follow these general guidelines:  Choose foods with a percent daily value for sodium of less than 5% (as listed on  the food label).  Use salt-free seasonings or herbs instead of table salt or sea salt.  Check with your health care provider or pharmacist before using salt substitutes.  Eat lower-sodium products, often labeled as "lower sodium" or "no salt added."  Eat fresh foods.  Eat more vegetables, fruits, and low-fat dairy products.  Choose whole grains. Look for the word "whole" as the first word in the ingredient list.  Choose fish and skinless chicken or [place] more often than red meat. Limit fish,  poultry, and meat to 6 oz (170 g) each day.  Limit sweets, desserts, sugars, and sugary drinks.  Choose heart-healthy fats.  Limit cheese to 1 oz (28 g) per day.  Eat more home-cooked food and less restaurant, buffet, and fast food.  Limit fried foods.  Cook foods using methods other than frying.  Limit canned vegetables. If you do use them, rinse them well to decrease the sodium.  When eating at a restaurant, ask that your food be prepared with less salt, or no salt if possible. WHAT FOODS CAN I EAT? Seek help from a dietitian for individual calorie needs. Grains Whole grain or whole wheat bread. Brown rice. Whole grain or whole wheat pasta. Quinoa, bulgur, and whole grain cereals. Low-sodium cereals. Corn or whole wheat flour tortillas. Whole grain cornbread. Whole grain crackers. Low-sodium crackers. Vegetables Fresh or frozen vegetables (raw, steamed, roasted, or grilled). Low-sodium or reduced-sodium tomato and vegetable juices. Low-sodium or reduced-sodium tomato sauce and paste. Low-sodium or reduced-sodium canned vegetables.  Fruits All fresh, canned (in natural juice), or frozen fruits. Meat and Other Protein Products Ground beef (85% or leaner), grass-fed beef, or beef trimmed of fat. Skinless chicken or [place]. Ground chicken or [place]. Pork trimmed of fat. All fish and seafood. Eggs. Dried beans, peas, or lentils. Unsalted nuts and seeds. Unsalted canned beans. Dairy Low-fat dairy products, such as skim or 1% milk, 2% or reduced-fat cheeses, low-fat ricotta or cottage cheese, or plain low-fat yogurt. Low-sodium or reduced-sodium cheeses. Fats and Oils Tub margarines without trans fats. Light or reduced-fat mayonnaise and salad dressings (reduced sodium). Avocado. Safflower, olive, or canola oils. Natural peanut or almond butter. Other Unsalted popcorn and pretzels. The items listed above may not be a complete list of recommended foods or beverages. Contact your dietitian  for more options. WHAT FOODS ARE NOT RECOMMENDED? Grains White bread. White pasta. White rice. Refined cornbread. Bagels and croissants. Crackers that contain trans fat. Vegetables Creamed or fried vegetables. Vegetables in a cheese sauce. Regular canned vegetables. Regular canned tomato sauce and paste. Regular tomato and vegetable juices. Fruits Dried fruits. Canned fruit in light or heavy syrup. Fruit juice. Meat and Other Protein Products Fatty cuts of meat. Ribs, chicken wings, bacon, sausage, bologna, salami, chitterlings, fatback, hot dogs, bratwurst, and packaged luncheon meats. Salted nuts and seeds. Canned beans with salt. Dairy Whole or 2% milk, cream, half-and-half, and cream cheese. Whole-fat or sweetened yogurt. Full-fat cheeses or blue cheese. Nondairy creamers and whipped toppings. Processed cheese, cheese spreads, or cheese curds. Condiments Onion and garlic salt, seasoned salt, table salt, and sea salt. Canned and packaged gravies. Worcestershire sauce. Tartar sauce. Barbecue sauce. Teriyaki sauce. Soy sauce, including reduced sodium. Steak sauce. Fish sauce. Oyster sauce. Cocktail sauce. Horseradish. Ketchup and mustard. Meat flavorings and tenderizers. Bouillon cubes. Hot sauce. Tabasco sauce. Marinades. Taco seasonings. Relishes. Fats and Oils Butter, stick margarine, lard, shortening, ghee, and bacon fat. Coconut, palm kernel, or palm oils. Regular salad dressings. Other Angie Fava and  olives. Salted popcorn and pretzels. The items listed above may not be a complete list of foods and beverages to avoid. Contact your dietitian for more information. WHERE CAN I FIND MORE INFORMATION? National Heart, Lung, and Blood Institute: travelstabloid.com Document Released: 02/24/2011 Document Revised: 07/22/2013 Document Reviewed: 01/09/2013 [facility] Patient Information 2015 [place], [place]. This information is not intended to replace advice given to you  by your health care provider. Make sure you discuss any questions you have with your health care provider.

## 2018-04-17 LAB — CBC WITH DIFFERENTIAL/PLATELET
Absolute Monocytes: 367 cells/uL (ref 200–950)
Basophils Absolute: 50 cells/uL (ref 0–200)
Basophils Relative: 0.7 %
Eosinophils Absolute: 158 cells/uL (ref 15–500)
Eosinophils Relative: 2.2 %
HCT: 45.1 % — ABNORMAL HIGH (ref 35.0–45.0)
Hemoglobin: 14.9 g/dL (ref 11.7–15.5)
Lymphs Abs: 3917 cells/uL — ABNORMAL HIGH (ref 850–3900)
MCH: 27.6 pg (ref 27.0–33.0)
MCHC: 33 g/dL (ref 32.0–36.0)
MCV: 83.5 fL (ref 80.0–100.0)
MPV: 9.9 fL (ref 7.5–12.5)
Monocytes Relative: 5.1 %
Neutro Abs: 2707 cells/uL (ref 1500–7800)
Neutrophils Relative %: 37.6 %
PLATELETS: 405 10*3/uL — AB (ref 140–400)
RBC: 5.4 10*6/uL — ABNORMAL HIGH (ref 3.80–5.10)
RDW: 12.9 % (ref 11.0–15.0)
TOTAL LYMPHOCYTE: 54.4 %
WBC: 7.2 10*3/uL (ref 3.8–10.8)

## 2018-04-17 LAB — COMPLETE METABOLIC PANEL WITH GFR
AG Ratio: 1.8 (calc) (ref 1.0–2.5)
ALBUMIN MSPROF: 4.8 g/dL (ref 3.6–5.1)
ALT: 38 U/L — ABNORMAL HIGH (ref 6–29)
AST: 18 U/L (ref 10–35)
Alkaline phosphatase (APISO): 59 U/L (ref 33–130)
BUN/Creatinine Ratio: 12 (calc) (ref 6–22)
BUN: 14 mg/dL (ref 7–25)
CO2: 29 mmol/L (ref 20–32)
Calcium: 10.2 mg/dL (ref 8.6–10.4)
Chloride: 101 mmol/L (ref 98–110)
Creat: 1.13 mg/dL — ABNORMAL HIGH (ref 0.50–1.05)
GFR, Est African American: 65 mL/min/{1.73_m2} (ref >=60)
GFR, Est Non African American: 56 mL/min/{1.73_m2} — ABNORMAL LOW (ref >=60)
Globulin: 2.6 g/dL (calc) (ref 1.9–3.7)
Glucose, Bld: 148 mg/dL — ABNORMAL HIGH (ref 65–99)
POTASSIUM: 3.9 mmol/L (ref 3.5–5.3)
Sodium: 142 mmol/L (ref 135–146)
Total Bilirubin: 0.5 mg/dL (ref 0.2–1.2)
Total Protein: 7.4 g/dL (ref 6.1–8.1)

## 2018-04-17 LAB — TSH: TSH: 1.92 mIU/L

## 2018-04-17 LAB — LIPID PANEL
Cholesterol: 173 mg/dL (ref <200)
HDL: 41 mg/dL — ABNORMAL LOW (ref >50)
LDL Cholesterol (Calc): 85 mg/dL (calc)
Non-HDL Cholesterol (Calc): 132 mg/dL (calc) — ABNORMAL HIGH (ref <130)
Total CHOL/HDL Ratio: 4.2 (calc) (ref <5.0)
Triglycerides: 353 mg/dL — ABNORMAL HIGH (ref <150)

## 2018-04-17 LAB — MAGNESIUM: Magnesium: 1.9 mg/dL (ref 1.5–2.5)

## 2018-04-17 LAB — HEMOGLOBIN A1C
EAG (MMOL/L): 9.5 (calc)
Hgb A1c MFr Bld: 7.6 % of total Hgb — ABNORMAL HIGH (ref <5.7)
Mean Plasma Glucose: 171 (calc)

## 2018-04-25 LAB — HM MAMMOGRAPHY

## 2018-05-01 ENCOUNTER — Other Ambulatory Visit: Payer: Self-pay | Admitting: Internal Medicine

## 2018-05-01 MED ORDER — AZITHROMYCIN 250 MG PO TABS: ORAL_TABLET | ORAL | 1 refills | Status: DC

## 2018-05-01 MED ORDER — PREDNISONE 20 MG PO TABS: ORAL_TABLET | ORAL | 0 refills | Status: DC

## 2018-05-07 ENCOUNTER — Other Ambulatory Visit: Payer: Self-pay | Admitting: Physician Assistant

## 2018-05-20 ENCOUNTER — Other Ambulatory Visit: Payer: Self-pay | Admitting: Physician Assistant

## 2018-05-20 DIAGNOSIS — Z6837 Body mass index (BMI) 37.0-37.9, adult: Principal | ICD-10-CM

## 2018-06-08 ENCOUNTER — Other Ambulatory Visit: Payer: Self-pay | Admitting: Physician Assistant

## 2018-07-08 ENCOUNTER — Other Ambulatory Visit: Payer: Self-pay | Admitting: Internal Medicine

## 2018-08-01 ENCOUNTER — Ambulatory Visit: Payer: Self-pay | Admitting: Physician Assistant

## 2018-08-17 ENCOUNTER — Other Ambulatory Visit: Payer: Self-pay | Admitting: Adult Health

## 2018-08-17 ENCOUNTER — Other Ambulatory Visit: Payer: Self-pay | Admitting: Internal Medicine

## 2018-08-17 MED ORDER — SITAGLIPTIN PHOSPHATE 100 MG PO TABS: ORAL_TABLET | ORAL | 0 refills | Status: DC

## 2018-08-30 NOTE — Progress Notes (Signed)
Assessment and Plan:   Essential hypertension - continue medications, DASH diet, exercise and monitor at home. Call if greater than 130/80.  -     CBC with Differential/Platelet -     BASIC METABOLIC PANEL WITH GFR -     Hepatic function panel -     TSH  Fatty liver LONG discussion about cirrhosis and fatty liver -     Hepatic function panel   Type 2 diabetes mellitus with other diabetic kidney complication, without long-term current use of insulin ([place]) Discussed general issues about diabetes pathophysiology and management., Educational material distributed., Suggested low cholesterol diet., Encouraged aerobic exercise., Discussed foot care., Reminded to get yearly retinal exam. Will get eye exam Will continue farxiga Will bring food log -     Hemoglobin A1c  Class 2 severe obesity due to excess calories with serious comorbidity and body mass index (BMI) of 37.0 to 37.9 in adult [facility]) - follow up 1 months for progress monitoring - increase veggies, decrease carbs - long discussion about weight loss, diet, and exercise - bring food log  Mixed hyperlipidemia -continue medications, check lipids, decrease fatty foods, increase activity.  Not at goal of less than 70, recheck today but may need to increase dose -     Lipid panel  Medication management -     Magnesium  Continue diet and meds as discussed. Further disposition pending results of labs.  HPI 53 y.o. female  presents for 3 month follow up with hypertension, hyperlipidemia, diabetes and vitamin D.   Her blood pressure has been controlled at home, today their BP is BP: 132/88 She does not workout. She denies chest pain, shortness of breath, dizziness.     She is on cholesterol medication, on pravastatin and denies myalgias. Her cholesterol is not at goal. The cholesterol last visit was:   Lab Results  Component Value Date   CHOL 173 04/16/2018   HDL 41 (L) 04/16/2018   LDLCALC 85 04/16/2018   TRIG 353 (H) 04/16/2018    CHOLHDL 4.2 04/16/2018    She has been working on diet and exercise for diabetes with CKD, she was on invokana but insurance was not covering, she wants to try jardiance, she is on MF and , and denies foot ulcerations, hyperglycemia, hypoglycemia , increased appetite, nausea, paresthesia of the feet, polydipsia, polyuria, visual disturbances, vomiting and weight loss. Last A1C in the office was:  Lab Results  Component Value Date   HGBA1C 7.6 (H) 04/16/2018   Lab Results  Component Value Date   GFRNONAA 2 (L) 04/16/2018   Patient is on Vitamin D supplement.  Lab Results  Component Value Date   VD25OH 67 01/08/2018     She continues to have stress and worry about her mother.  She reports that the insurance company promised her home health would be able to do more than they actually can.  She is still unable to convince her to go to a nursing home. BMI is Body mass index is 36.11 kg/m., she is working on diet and exercise. Wt Readings from Last 3 Encounters:  09/03/18 217 lb (98.4 kg)  04/16/18 219 lb 12.8 oz (99.7 kg)  01/08/18 219 lb 9.6 oz (99.6 kg)      Current Medications:  Current Outpatient Medications on File Prior to Visit  Medication Sig Dispense Refill  . aspirin EC 81 MG tablet Take 81 mg by mouth daily.    Marland Kitchen atorvastatin (LIPITOR) 20 MG tablet TAKE 1 TABLET ONCE  DAILY. 90 tablet 0  . buPROPion (WELLBUTRIN XL) 150 MG 24 hr tablet Take 1 tablet every Morning for Mood 90 tablet 0  . Cholecalciferol (VITAMIN D-3) 5000 UNITS TABS Take by mouth daily.    . dapagliflozin propanediol (FARXIGA) 10 MG TABS tablet Take 1 tablet Daily for Diabetes 90 tablet 0  . glucose blood test strip Use as instructed 100 each 12  . glucose monitoring kit (FREESTYLE) monitoring kit 1 each by Does not apply route as needed for other. 1 each 0  . lisinopril (ZESTRIL) 20 MG tablet Take 1 tablet Daily for BP 90 tablet 1  . lisinopril-hydrochlorothiazide (ZESTORETIC) 20-12.5 MG tablet Take 1  tablet Daily for BP & Fluid 90 tablet 0  . Magnesium 500 MG CAPS Take 1 capsule by mouth daily. Taking 961m daily    . metFORMIN (GLUCOPHAGE-XR) 500 MG 24 hr tablet TAKE (2) TABLETS TWICE DAILY. 360 tablet 0  . omeprazole (PRILOSEC) 20 MG capsule Take 1 capsule Daily for Acid Indigestion & Reflux 90 capsule 0  . sitaGLIPtin (JANUVIA) 100 MG tablet Take 1 tablet Daily for Diabetes 90 tablet 0  . topiramate (TOPAMAX) 100 MG tablet Take 1 tablet 1 hour before sleep (Patient not taking: Reported on 09/03/2018) 30 tablet 2   No current facility-administered medications on file prior to visit.     Medical History:  Past Medical History:  Diagnosis Date  . Colitis 07/11/2012  . Colon polyps 07/11/2012   TUBULOVILLOUS ADENOMA  . Diabetes mellitus   . Exercise-induced asthma    outside asthma   . Hernia   . Hyperlipidemia   . Hypertension   . Iron deficiency   . Kidney stones   . Obesity   . OSA on CPAP   . Vitamin D deficiency     Allergies:  Allergies  Allergen Reactions  . Welchol [Colesevelam Hcl] Hives    HIves     Review of Systems:  Review of Systems  Constitutional: Negative for chills, fever and malaise/fatigue.  HENT: Negative for congestion, ear pain and sore throat.   Eyes: Negative.   Respiratory: Negative for cough, shortness of breath and wheezing.   Cardiovascular: Negative for chest pain, palpitations and leg swelling.  Gastrointestinal: Negative for abdominal pain, blood in stool, constipation, diarrhea, heartburn and melena.  Genitourinary: Negative.   Skin: Negative.   Neurological: Negative for dizziness, sensory change, loss of consciousness and headaches.  Psychiatric/Behavioral: Negative for depression. The patient is not nervous/anxious and does not have insomnia.     Family history- Review and unchanged  Social history- Review and unchanged  Physical Exam: BP 132/88   Pulse 63   Temp 97.7 F (36.5 C)   Ht _0  (1.651 m)   Wt 217 lb (98.4 kg)    SpO2 97%   BMI 36.11 kg/m  Wt Readings from Last 3 Encounters:  09/03/18 217 lb (98.4 kg)  04/16/18 219 lb 12.8 oz (99.7 kg)  01/08/18 219 lb 9.6 oz (99.6 kg)    General Appearance: Morbidly obese, Well nourished well developed, in no apparent distress. Eyes: PERRLA, EOMs, conjunctiva no swelling or erythema ENT/Mouth: Ear canals normal without obstruction, swelling, erythma, discharge.  TMs normal bilaterally.  Oropharynx moist, clear, without exudate, or postoropharyngeal swelling. Neck: Supple, thyroid normal,no cervical adenopathy  Respiratory: Respiratory effort normal, Breath sounds clear A&P without rhonchi, wheeze, or rale.  No retractions, no accessory usage. Cardio: RRR with no MRGs. Brisk peripheral pulses without edema.  Abdomen: Soft, + BS,  Non tender, no guarding, rebound, hernias, masses. Musculoskeletal: Full ROM, 5/5 strength, Normal gait Skin: Warm, dry without rashes, lesions, ecchymosis.  Neuro: Awake and oriented X 3, Cranial nerves intact. Normal muscle tone, no cerebellar symptoms. Psych: Normal affect, Insight and Judgment appropriate.    Vicie Mutters, PA-C 11:34 AM [facility]

## 2018-09-03 ENCOUNTER — Encounter: Payer: Self-pay | Admitting: Physician Assistant

## 2018-09-03 ENCOUNTER — Other Ambulatory Visit: Payer: Self-pay

## 2018-09-03 ENCOUNTER — Ambulatory Visit: Payer: Managed Care, Other (non HMO) | Admitting: Physician Assistant

## 2018-09-03 VITALS — BP 132/88 | HR 63 | Temp 97.7°F | Ht 65.0 in | Wt 217.0 lb

## 2018-09-03 DIAGNOSIS — K760 Fatty (change of) liver, not elsewhere classified: Secondary | ICD-10-CM

## 2018-09-03 DIAGNOSIS — I10 Essential (primary) hypertension: Secondary | ICD-10-CM | POA: Diagnosis not present

## 2018-09-03 DIAGNOSIS — E559 Vitamin D deficiency, unspecified: Secondary | ICD-10-CM | POA: Diagnosis not present

## 2018-09-03 DIAGNOSIS — E782 Mixed hyperlipidemia: Secondary | ICD-10-CM | POA: Diagnosis not present

## 2018-09-03 DIAGNOSIS — E1129 Type 2 diabetes mellitus with other diabetic kidney complication: Secondary | ICD-10-CM | POA: Diagnosis not present

## 2018-09-03 DIAGNOSIS — Z79899 Other long term (current) drug therapy: Secondary | ICD-10-CM | POA: Diagnosis not present

## 2018-09-03 DIAGNOSIS — Z6837 Body mass index (BMI) 37.0-37.9, adult: Secondary | ICD-10-CM

## 2018-09-03 MED ORDER — PHENTERMINE HCL 37.5 MG PO TABS: 37.5000 mg | ORAL_TABLET | Freq: Every day | ORAL | 2 refills | Status: DC

## 2018-09-03 MED ORDER — VASCEPA 1 G PO CAPS: 2.0000 g | ORAL_CAPSULE | Freq: Two times a day (BID) | ORAL | 3 refills | Status: DC

## 2018-09-03 NOTE — Patient Instructions (Addendum)
Please do vitamin C 1000mg  twice a day- stop if you have any worsening heart burn or diarrhea.   You can take tylenol for fevers above 101 if you feel uncomfortable. Remember a fever itself is not bad, the tylenol is more for your comfort. See the max of tylenol below.  Please do breathing exercises. Make sure that you are taking deep breaths and trying to walk around the room as much as possible.  Please lay "prone" or on your belly for up to 4 hours a day, you can split this up to 30 minute or 1 hour increments but this helps get oxygen to certain places of your lungs.  Please get on 81 mg of aspirin unless you are unable to tolerate this or have a contraindication.  Please pump your fee like your are pedaling a bike to prevent clots in your legs.   Push fluids with Gatorade or electrolyte replacement in addition to water, aim for 100-120 oz a day if no contraindications.  If you have worsening shortness of breath, unable to complete full sentences, are panting, can not hold your breath for longer than 25 seconds please contact the office immediately for call 911. Let them know you are being monitored for coronavirus.   TOPAMAX Going to start you on topamax, start on 1/2 pill for 3-5 nights, can increase to a whole pill for 1-2 weeks. Can the take 1/2 pill the AM and 1 pill at night for 1-2 weeks and can take 1 pill twice a day  Take with supper This medication is good for weight loss, headaches, pain This medication can cause numbness, tingling and can cause brain fog- stop if you get these  Let me know how you are doing with this.   Topiramate tablets What is this medicine? TOPIRAMATE (toe PYRE a mate) is used to treat seizures in adults or children with epilepsy. It is also used for the prevention of migraine headaches. This medicine may be used for other purposes; ask your health care provider or pharmacist if you have questions. COMMON BRAND NAME(S): Topamax, Topiragen What should I  tell my health care provider before I take this medicine? They need to know if you have any of these conditions: -bleeding disorders -cirrhosis of the liver or liver disease -diarrhea -glaucoma -kidney stones or kidney disease -low blood counts, like low white cell, platelet, or red cell counts -lung disease like asthma, obstructive pulmonary disease, emphysema -metabolic acidosis -on a ketogenic diet -schedule for surgery or a procedure -suicidal thoughts, plans, or attempt; a previous suicide attempt by you or a family member -an unusual or allergic reaction to topiramate, other medicines, foods, dyes, or preservatives -pregnant or trying to get pregnant -breast-feeding How should I use this medicine? Take this medicine by mouth with a glass of water. Follow the directions on the prescription label. Do not crush or chew. You may take this medicine with meals. Take your medicine at regular intervals. Do not take it more often than directed. Talk to your pediatrician regarding the use of this medicine in children. Special care may be needed. While this drug may be prescribed for children as young as 70 years of age for selected conditions, precautions do apply. Overdosage: If you think you have taken too much of this medicine contact a poison control center or emergency room at once. NOTE: This medicine is only for you. Do not share this medicine with others. What if I miss a dose? If you miss a  dose, take it as soon as you can. If your next dose is to be taken in less than 6 hours, then do not take the missed dose. Take the next dose at your regular time. Do not take double or extra doses. What may interact with this medicine? Do not take this medicine with any of the following medications: -probenecid This medicine may also interact with the following medications: -acetazolamide -alcohol -amitriptyline -aspirin and aspirin-like medicines -birth control pills -certain medicines for  depression -certain medicines for seizures -certain medicines that treat or prevent blood clots like warfarin, enoxaparin, dalteparin, apixaban, dabigatran, and rivaroxaban -digoxin -hydrochlorothiazide -lithium -medicines for pain, sleep, or muscle relaxation -metformin -methazolamide -NSAIDS, medicines for pain and inflammation, like ibuprofen or naproxen -pioglitazone -risperidone This list may not describe all possible interactions. Give your health care provider a list of all the medicines, herbs, non-prescription drugs, or dietary supplements you use. Also tell them if you smoke, drink alcohol, or use illegal drugs. Some items may interact with your medicine. What should I watch for while using this medicine? Visit your doctor or health care professional for regular checks on your progress. Do not stop taking this medicine suddenly. This increases the risk of seizures if you are using this medicine to control epilepsy. Wear a medical identification bracelet or chain to say you have epilepsy or seizures, and carry a card that lists all your medicines. This medicine can decrease sweating and increase your body temperature. Watch for signs of deceased sweating or fever, especially in children. Avoid extreme heat, hot baths, and saunas. Be careful about exercising, especially in hot weather. Contact your health care provider right away if you notice a fever or decrease in sweating. You should drink plenty of fluids while taking this medicine. If you have had kidney stones in the past, this will help to reduce your chances of forming kidney stones. If you have stomach pain, with nausea or vomiting and yellowing of your eyes or skin, call your doctor immediately. You may get drowsy, dizzy, or have blurred vision. Do not drive, use machinery, or do anything that needs mental alertness until you know how this medicine affects you. To reduce dizziness, do not sit or stand up quickly, especially if you  are an older patient. Alcohol can increase drowsiness and dizziness. Avoid alcoholic drinks. If you notice blurred vision, eye pain, or other eye problems, seek medical attention at once for an eye exam. The use of this medicine may increase the chance of suicidal thoughts or actions. Pay special attention to how you are responding while on this medicine. Any worsening of mood, or thoughts of suicide or dying should be reported to your health care professional right away. This medicine may increase the chance of developing metabolic acidosis. If left untreated, this can cause kidney stones, bone disease, or slowed growth in children. Symptoms include breathing fast, fatigue, loss of appetite, irregular heartbeat, or loss of consciousness. Call your doctor immediately if you experience any of these side effects. Also, tell your doctor about any surgery you plan on having while taking this medicine since this may increase your risk for metabolic acidosis. Birth control pills may not work properly while you are taking this medicine. Talk to your doctor about using an extra method of birth control. Women who become pregnant while using this medicine may enroll in the [place] Pregnancy Registry by calling [phone]. This registry collects information about the safety of antiepileptic drug use during pregnancy.  What side effects may I notice from receiving this medicine? Side effects that you should report to your doctor or health care professional as soon as possible: -allergic reactions like skin rash, itching or hives, swelling of the face, lips, or tongue -decreased sweating and/or rise in body temperature -depression -difficulty breathing, fast or irregular breathing patterns -difficulty speaking -difficulty walking or controlling muscle movements -hearing impairment -redness, blistering, peeling or loosening of the skin, including inside the mouth -tingling, pain or  numbness in the hands or feet -unusual bleeding or bruising -unusually weak or tired -worsening of mood, thoughts or actions of suicide or dying Side effects that usually do not require medical attention (report to your doctor or health care professional if they continue or are bothersome): -altered taste -back pain, joint or muscle aches and pains -diarrhea, or constipation -headache -loss of appetite -nausea -stomach upset, indigestion -tremors This list may not describe all possible side effects. Call your doctor for medical advice about side effects. You may report side effects to FDA at 1-800-FDA-1088. Where should I keep my medicine? Keep out of the reach of children. Store at room temperature between 15 and 30 degrees C (59 and 86 degrees F) in a tightly closed container. Protect from moisture. Throw away any unused medicine after the expiration date. NOTE: This sheet is a summary. It may not cover all possible information. If you have questions about this medicine, talk to your doctor, pharmacist, or health care provider.  2018 Elsevier/Gold Standard (2013-03-11 23:17:57)  Phentermine  While taking the medication we may ask that you come into the office once a month for the first month for a blood pressure check and EKG.   Also please bring in a food log for that visit to review. It is helpful if you bring in a food diary or use an app on your phone such as myfitnesspal to record your calorie intake, especially in the beginning. BRING FOR YOUR FIRST VISIT.  After that first initial visit, we will want to see you once every 2-3 months to monitor your weight, blood pressure, and heart rate.   In addition we can help answer your questions about diet, exercise, and help you every step of the way with your weight loss journey.  You can start out on 1/3 to 1/2 a pill in the morning and if you are tolerating it well you can increase to one pill daily. I also have some patients that  take 1/3 or 1/2 at lunch to help prevent night time eating.  This medication is cheapest CASH pay at [facility] is 14-17 dollars and you do NOT need a membership to get meds from there.   It causes dry mouth and constipation in almost every patient, so try to get 80-100 oz of water a day and increase fiber such as veggies. You can add on a stool softener if you would like.   It can give you energy however it can also cause some people to be shaky, anxious or have palpitations. Stop this medication if that happens and contact the office.   If this medication does not work for you there are several medications that we can try to help rewire your brain in addition to making healthier habits.   What is this medicine? PHENTERMINE (FEN ter meen) decreases your appetite. This medicine is intended to be used in addition to a healthy reduced calorie diet and exercise. The best results are achieved this way.  This medicine is only indicated for short-term use. Eventually your weight loss may level out and the medication will no longer be needed.   How should I use this medicine? Take this medicine by mouth. Follow the directions on the prescription label. The tablets should stay in the bottle until immediately before you take your dose. Take your doses at regular intervals. Do not take your medicine more often than directed.  Overdosage: If you think you have taken too much of this medicine contact a poison control center or emergency room at once. NOTE: This medicine is only for you. Do not share this medicine with others.  What if I miss a dose? If you miss a dose, take it as soon as you can. If it is almost time for your next dose, take only that dose. Do not take double or extra doses. Do not increase or in any way change your dose without consulting your doctor.  What should I watch for while using this medicine? Notify your physician immediately if you become short of breath  while doing your normal activities. Do not take this medicine within 6 hours of bedtime. It can keep you from getting to sleep. Avoid drinks that contain caffeine and try to stick to a regular bedtime every night. Do not stand or sit up quickly, especially if you are an older patient. This reduces the risk of dizzy or fainting spells. Avoid alcoholic drinks.  What side effects may I notice from receiving this medicine? Side effects that you should report to your doctor or health care professional as soon as possible: -chest pain, palpitations -depression or severe changes in mood -increased blood pressure -irritability -nervousness or restlessness -severe dizziness -shortness of breath -problems urinating -unusual swelling of the legs -vomiting  Side effects that usually do not require medical attention (report to your doctor or health care professional if they continue or are bothersome): -blurred vision or other eye problems -changes in sexual ability or desire -constipation or diarrhea -difficulty sleeping -dry mouth or unpleasant taste -headache -nausea This list may not describe all possible side effects. Call your doctor for medical advice about side effects. You may report side effects to FDA at 1-800-FDA-1088.

## 2018-09-04 LAB — CBC WITH DIFFERENTIAL/PLATELET
Absolute Monocytes: 302 cells/uL (ref 200–950)
Basophils Absolute: 51 cells/uL (ref 0–200)
Basophils Relative: 0.9 %
Eosinophils Absolute: 108 cells/uL (ref 15–500)
Eosinophils Relative: 1.9 %
HCT: 45 % (ref 35.0–45.0)
Hemoglobin: 15 g/dL (ref 11.7–15.5)
Lymphs Abs: 2445 cells/uL (ref 850–3900)
MCH: 27.7 pg (ref 27.0–33.0)
MCHC: 33.3 g/dL (ref 32.0–36.0)
MCV: 83 fL (ref 80.0–100.0)
MPV: 10 fL (ref 7.5–12.5)
Monocytes Relative: 5.3 %
Neutro Abs: 2793 cells/uL (ref 1500–7800)
Neutrophils Relative %: 49 %
Platelets: 340 10*3/uL (ref 140–400)
RBC: 5.42 10*6/uL — ABNORMAL HIGH (ref 3.80–5.10)
RDW: 14 % (ref 11.0–15.0)
Total Lymphocyte: 42.9 %
WBC: 5.7 10*3/uL (ref 3.8–10.8)

## 2018-09-04 LAB — LIPID PANEL
Cholesterol: 172 mg/dL (ref <200)
HDL: 39 mg/dL — ABNORMAL LOW (ref >=50)
LDL Cholesterol (Calc): 96 mg/dL (calc)
Non-HDL Cholesterol (Calc): 133 mg/dL (calc) — ABNORMAL HIGH (ref <130)
Total CHOL/HDL Ratio: 4.4 (calc) (ref <5.0)
Triglycerides: 245 mg/dL — ABNORMAL HIGH (ref <150)

## 2018-09-04 LAB — COMPLETE METABOLIC PANEL WITH GFR
AG Ratio: 2 (calc) (ref 1.0–2.5)
ALT: 60 U/L — ABNORMAL HIGH (ref 6–29)
AST: 38 U/L — ABNORMAL HIGH (ref 10–35)
Albumin: 4.8 g/dL (ref 3.6–5.1)
Alkaline phosphatase (APISO): 61 U/L (ref 37–153)
BUN: 14 mg/dL (ref 7–25)
CO2: 31 mmol/L (ref 20–32)
Calcium: 10.5 mg/dL — ABNORMAL HIGH (ref 8.6–10.4)
Chloride: 100 mmol/L (ref 98–110)
Creat: 0.95 mg/dL (ref 0.50–1.05)
GFR, Est African American: 80 mL/min/{1.73_m2} (ref >=60)
GFR, Est Non African American: 69 mL/min/{1.73_m2} (ref >=60)
Globulin: 2.4 g/dL (calc) (ref 1.9–3.7)
Glucose, Bld: 143 mg/dL — ABNORMAL HIGH (ref 65–99)
Potassium: 4.5 mmol/L (ref 3.5–5.3)
Sodium: 142 mmol/L (ref 135–146)
Total Bilirubin: 0.7 mg/dL (ref 0.2–1.2)
Total Protein: 7.2 g/dL (ref 6.1–8.1)

## 2018-09-04 LAB — TSH: TSH: 1.12 mIU/L

## 2018-09-04 LAB — HEMOGLOBIN A1C
Hgb A1c MFr Bld: 8.9 % of total Hgb — ABNORMAL HIGH (ref <5.7)
Mean Plasma Glucose: 209 (calc)
eAG (mmol/L): 11.6 (calc)

## 2018-09-04 LAB — VITAMIN D 25 HYDROXY (VIT D DEFICIENCY, FRACTURES): Vit D, 25-Hydroxy: 75 ng/mL (ref 30–100)

## 2018-09-04 LAB — MAGNESIUM: Magnesium: 2.1 mg/dL (ref 1.5–2.5)

## 2018-09-14 ENCOUNTER — Other Ambulatory Visit: Payer: Self-pay | Admitting: Adult Health

## 2018-09-28 MED ORDER — SULFAMETHOXAZOLE-TRIMETHOPRIM 800-160 MG PO TABS: 1.0000 | ORAL_TABLET | Freq: Two times a day (BID) | ORAL | 0 refills | Status: DC

## 2018-10-01 ENCOUNTER — Ambulatory Visit: Payer: Managed Care, Other (non HMO) | Admitting: Physician Assistant

## 2018-10-01 ENCOUNTER — Other Ambulatory Visit: Payer: Self-pay

## 2018-10-01 ENCOUNTER — Encounter: Payer: Self-pay | Admitting: Physician Assistant

## 2018-10-01 VITALS — BP 130/76 | HR 100 | Temp 97.3°F | Ht 65.0 in | Wt 219.8 lb

## 2018-10-01 DIAGNOSIS — N611 Abscess of the breast and nipple: Secondary | ICD-10-CM

## 2018-10-01 NOTE — Progress Notes (Signed)
   Subjective:    Patient ID: Jenny Singleton, female    DOB: [birth year], 53 y.o.   MRN: [id number]  HPI 53 y.o. WF presents with boil on left breast. She has noticed tenderness, warmth on medial superior left breast x 1 week. She has tried warm compresses and tylenol without help.  She denies nipple discharge, fever, chills.  She had recent MGM with Dr. Ouida Sills that was normal 2020  Blood pressure 130/76, pulse 100, temperature (!) 97.3 F (36.3 C), height 5\' 5"  (1.651 m), weight 219 lb 12.8 oz (99.7 kg), SpO2 98 %.  Review of Systems See HPI    Objective:   Physical Exam Left breast with erythema, warmth and fluctuance at left superior medial to the nipple. No adenopathy appreciated Patient is not in stress No nipple discharge able to be expressed, no breast masses appreciated.   PROCEDURE NOTE: I&D of Abscess Verbal consent obtained. Local anesthesia with 1 cc of 1% lidocaine. Site cleansed with alcohol  Incision of 0.5 cm was made using a 11 blade, discharge of copious amounts of pus and serosanguinous fluid. Cleansed and dressed. After care instructions provided. Patient to return to clinic on as needed     Assessment & Plan:  Left breast abscess Apply hot compresses frequently to promote drainage. Oral antibiotics -- see med orders. I & D procedure as above. RTC PRN- if not better will get MGM/US however appears infectious

## 2018-10-01 NOTE — Patient Instructions (Signed)
Skin Abscess  A skin abscess is an infected area on or under your skin that contains a collection of pus and other material. An abscess may also be called a furuncle, carbuncle, or boil. An abscess can occur in or on almost any part of your body. Some abscesses break open (rupture) on their own. Most continue to get worse unless they are treated. The infection can spread deeper into the body and eventually into your blood, which can make you feel ill. Treatment usually involves draining the abscess. What are the causes? An abscess occurs when germs, like bacteria, pass through your skin and cause an infection. This may be caused by:  A scrape or cut on your skin.  A puncture wound through your skin, including a needle injection or insect bite.  Blocked oil or sweat glands.  Blocked and infected hair follicles.  A cyst that forms beneath your skin (sebaceous cyst) and becomes infected. What increases the risk? This condition is more likely to develop in people who:  Have a weak body defense system (immune system).  Have diabetes.  Have dry and irritated skin.  Get frequent injections or use illegal IV drugs.  Have a foreign body in a wound, such as a splinter.  Have problems with their lymph system or veins. What are the signs or symptoms? Symptoms of this condition include:  A painful, firm bump under the skin.  A bump with pus at the top. This may break through the skin and drain. Other symptoms include:  Redness surrounding the abscess site.  Warmth.  Swelling of the lymph nodes (glands) near the abscess.  Tenderness.  A sore on the skin. How is this diagnosed? This condition may be diagnosed based on:  A physical exam.  Your medical history.  A sample of pus. This may be used to find out what is causing the infection.  Blood tests.  Imaging tests, such as an ultrasound, CT scan, or MRI. How is this treated? A small abscess that drains on its own may not  need treatment. Treatment for larger abscesses may include:  Moist heat or heat pack applied to the area several times a day.  A procedure to drain the abscess (incision and drainage).  Antibiotic medicines. For a severe abscess, you may first get antibiotics through an IV and then change to antibiotics by mouth. Follow these instructions at home: Medicines   Take over-the-counter and prescription medicines only as told by your health care provider.  If you were prescribed an antibiotic medicine, take it as told by your health care provider. Do not stop taking the antibiotic even if you start to feel better. Abscess care   If you have an abscess that has not drained, apply heat to the affected area. Use the heat source that your health care provider recommends, such as a moist heat pack or a heating pad. ? Place a towel between your skin and the heat source. ? Leave the heat on for 20-30 minutes. ? Remove the heat if your skin turns bright red. This is especially important if you are unable to feel pain, heat, or cold. You may have a greater risk of getting burned.  Follow instructions from your health care provider about how to take care of your abscess. Make sure you: ? Cover the abscess with a bandage (dressing). ? Change your dressing or gauze as told by your health care provider. ? Wash your hands with soap and water before you change the   dressing or gauze. If soap and water are not available, use hand sanitizer.  Check your abscess every day for signs of a worsening infection. Check for: ? More redness, swelling, or pain. ? More fluid or blood. ? Warmth. ? More pus or a bad smell. General instructions  To avoid spreading the infection: ? Do not share personal care items, towels, or hot tubs with others. ? Avoid making skin contact with other people.  Keep all follow-up visits as told by your health care provider. This is important. Contact a health care provider if you  have:  More redness, swelling, or pain around your abscess.  More fluid or blood coming from your abscess.  Warm skin around your abscess.  More pus or a bad smell coming from your abscess.  A fever.  Muscle aches.  Chills or a general ill feeling. Get help right away if you:  Have severe pain.  See red streaks on your skin spreading away from the abscess. Summary  A skin abscess is an infected area on or under your skin that contains a collection of pus and other material.  A small abscess that drains on its own may not need treatment.  Treatment for larger abscesses may include having a procedure to drain the abscess and taking an antibiotic. This information is not intended to replace advice given to you by your health care provider. Make sure you discuss any questions you have with your health care provider. Document Released: 12/15/2004 Document Revised: 06/28/2018 Document Reviewed: 04/20/2017 Elsevier Patient Education  2020 [employer]  

## 2018-10-09 ENCOUNTER — Other Ambulatory Visit: Payer: Self-pay | Admitting: Adult Health

## 2018-10-09 MED ORDER — SULFAMETHOXAZOLE-TRIMETHOPRIM 800-160 MG PO TABS: 1.0000 | ORAL_TABLET | Freq: Two times a day (BID) | ORAL | 0 refills | Status: DC

## 2018-10-11 MED ORDER — DOXYCYCLINE HYCLATE 100 MG PO CAPS: ORAL_CAPSULE | ORAL | 0 refills | Status: DC

## 2018-10-18 ENCOUNTER — Other Ambulatory Visit: Payer: Self-pay | Admitting: Adult Health

## 2018-11-17 ENCOUNTER — Other Ambulatory Visit: Payer: Self-pay | Admitting: Internal Medicine

## 2018-12-15 ENCOUNTER — Other Ambulatory Visit: Payer: Self-pay | Admitting: Internal Medicine

## 2018-12-30 ENCOUNTER — Other Ambulatory Visit: Payer: Self-pay | Admitting: Internal Medicine

## 2019-01-01 MED ORDER — BIMATOPROST 0.03 % OP SOLN: 1.0000 [drp] | Freq: Every day | OPHTHALMIC | 12 refills | Status: DC

## 2019-01-09 NOTE — Progress Notes (Deleted)
Complete Physical  Assessment and Plan:  Encounter for general adult medical examination with abnormal findings - CBC with Differential/Platelet - BASIC METABOLIC PANEL WITH GFR - Hepatic function panel - Magnesium  Essential hypertension - continue medications, DASH diet, exercise and monitor at home. Call if greater than 130/80.  - Urinalysis, Routine w reflex microscopic (not at [facility]) - Microalbumin / creatinine urine ratio - EKG 12-Lead - TSH  Type 2 diabetes mellitus with other diabetic kidney complication, without long-term current use of insulin ([place]) Discussed general issues about diabetes pathophysiology and management., Educational material distributed., Suggested low cholesterol diet., Encouraged aerobic exercise., Discussed foot care., Reminded to get yearly retinal exam. - Hemoglobin A1c   Iron deficiency anemia, unspecified - Iron and TIBC - Vitamin B12  Fatty liver Check labs, avoid tylenol, alcohol, weight loss advised.   Transaminitis -due to fatty liver   Obesity - - follow up 3 months for progress monitoring - long discussion about weight loss, diet, and exercise, will start the patient on phentermine- hand out given and AE's discussed, will do close follow up.   Hyperlipidemia -cont pravastatin - Lipid panel  Vitamin D deficiency -cont supplement - VITAMIN D 25 Hydroxy (Vit-D Deficiency, Fractures)  Discussed med's effects and SE's. Screening labs and tests as requested with regular follow-up as recommended.  HPI  53 y.o. female  presents for a complete physical.  Her blood pressure has been controlled at home, today their BP is  .  She does not workout. She denies chest pain, shortness of breath, dizziness.  Her mom is now in nursing home and is in hospice, this is less stress for her.   She is on cholesterol medication and denies myalgias. Her cholesterol is not at goal. The cholesterol last visit was:  Lab Results  Component Value Date    CHOL 172 09/03/2018   HDL 39 (L) 09/03/2018   LDLCALC 96 09/03/2018   TRIG 245 (H) 09/03/2018   CHOLHDL 4.4 09/03/2018  . She has been working on diet and exercise for diabetes with CKD, she is not on bASA, she is on ACE/ARB and denies foot ulcerations, hyperglycemia, hypoglycemia , increased appetite, nausea, paresthesia of the feet, polydipsia, polyuria, visual disturbances, vomiting and weight loss. Last A1C in the office was:  Lab Results  Component Value Date   HGBA1C 8.9 (H) 09/03/2018    Lab Results  Component Value Date   GFRNONAA 69 09/03/2018   Patient is on Vitamin D supplement.   Lab Results  Component Value Date   VD25OH 75 09/03/2018     BMI is There is no height or weight on file to calculate BMI., she is working on diet and exercise. She is on phentermine and has had some weight loss, she has not started the topamax.  Wt Readings from Last 3 Encounters:  10/01/18 219 lb 12.8 oz (99.7 kg)  09/03/18 217 lb (98.4 kg)  04/16/18 219 lb 12.8 oz (99.7 kg)     Current Medications:  Current Outpatient Medications on File Prior to Visit  Medication Sig Dispense Refill  . aspirin EC 81 MG tablet Take 81 mg by mouth daily.    Marland Kitchen atorvastatin (LIPITOR) 20 MG tablet Take 1 tablet Daily for Cholesterol 90 tablet 0  . bimatoprost (LUMIGAN) 0.03 % ophthalmic solution Place 1 drop into both eyes at bedtime. 2.5 mL 12  . buPROPion (WELLBUTRIN XL) 150 MG 24 hr tablet Take 1 tablet Daily for Mood 90 tablet 1  . Cholecalciferol (  VITAMIN D-3) 5000 UNITS TABS Take by mouth daily.    . dapagliflozin propanediol (FARXIGA) 10 MG TABS tablet Take 1 tablet Daily for Diabetes 90 tablet 1  . doxycycline (VIBRAMYCIN) 100 MG capsule Take 1 capsule twice daily with food 20 capsule 0  . glucose blood test strip Use as instructed 100 each 12  . glucose monitoring kit (FREESTYLE) monitoring kit 1 each by Does not apply route as needed for other. 1 each 0  . Icosapent Ethyl (VASCEPA) 1 g CAPS Take  2 capsules (2 g total) by mouth 2 (two) times daily. 380 capsule 3  . lisinopril (ZESTRIL) 20 MG tablet Take 1 tablet Daily for BP 90 tablet 3  . lisinopril-hydrochlorothiazide (ZESTORETIC) 20-12.5 MG tablet Take 1 tablet Daily for BP & Diabetic Kidney Protection 90 tablet 1  . Magnesium 500 MG CAPS Take 1 capsule by mouth daily. Taking 957m daily    . metFORMIN (GLUCOPHAGE-XR) 500 MG 24 hr tablet Take 2 tablets 2 x /day with Meals for Diabetes 360 tablet 1  . omeprazole (PRILOSEC) 20 MG capsule Take 1 capsule Daily for Indigestion & Heatburn 90 capsule 1  . phentermine (ADIPEX-P) 37.5 MG tablet Take 1 tablet (37.5 mg total) by mouth daily before breakfast. 30 tablet 2  . sitaGLIPtin (JANUVIA) 100 MG tablet TAKE 1 TABLET ONCE DAILY FOR DIABETES. 30 tablet 2  . sulfamethoxazole-trimethoprim (BACTRIM DS) 800-160 MG tablet Take 1 tablet by mouth 2 (two) times daily. 28 tablet 0  . topiramate (TOPAMAX) 100 MG tablet Take 1 tablet 1 hour before sleep 30 tablet 2   No current facility-administered medications on file prior to visit.     Health Maintenance:   Immunization History  Administered Date(s) Administered  . Influenza Whole 12/19/2008  . PPD Test 05/23/2013  . Td 03/21/2006   Tetanus: 2008 DUE  Flu vaccine: 2016 LNUU:VOZDGUpost ablation, no further periods.   Pap: 2017 MGM: 2018 DUE will see Dr. AOuida SillsDEXA: Not due Colonoscopy: 04/2017 EGD: 2014 Last Dental Exam: Dr. WJudeth HornLast [facility] Ex[facility]    Patient Care Team: MUnk Pinto MD as PCP - General (Internal Medicine) SLadene Artist MD as Consulting Physician (Gastroenterology) RRemer Macho MD as Attending Physician (Ophthalmology) ALenore Cordia MD (Obstetrics and Gynecology) RFrederik Pear MD as Consulting Physician (Orthopedic Surgery) WRolan Bucco MD as Consulting Physician (Urology) FSherlyn Lick MD (Dermatology)   Medical History:  Past Medical History:  Diagnosis Date   . Colitis 07/11/2012  . Colon polyps 07/11/2012   TUBULOVILLOUS ADENOMA  . Diabetes mellitus   . Exercise-induced asthma    outside asthma   . Hernia   . Hyperlipidemia   . Hypertension   . Iron deficiency   . Kidney stones   . Obesity   . OSA on CPAP   . Vitamin D deficiency    Allergies Allergies  Allergen Reactions  . Welchol [Colesevelam Hcl] Hives    HIves    SURGICAL HISTORY She  has a past surgical history that includes Tonsilectomy, adenoidectomy, bilateral myringotomy and tubes; Ablation; Abdominoplasty; Esophagogastroduodenoscopy (07/11/2012); Colonoscopy w/ biopsies (07/11/2012); Hernia repair; and Colonoscopy. FAMILY HISTORY Her family history includes Colon cancer (age of onset: 788 in her father. She was adopted. SOCIAL HISTORY She  reports that she quit smoking about 33 years ago. She has a 2.00 pack-year smoking history. She has never used smokeless tobacco. She reports current alcohol use. She reports that she does not use drugs.  Review  of Systems: Review of Systems  Constitutional: Negative for chills, fever and malaise/fatigue.  HENT: Negative for congestion, ear pain and sore throat.   Eyes: Negative.   Respiratory: Negative for cough, shortness of breath and wheezing.   Cardiovascular: Negative for chest pain, palpitations and leg swelling.  Gastrointestinal: Negative for abdominal pain, blood in stool, constipation, diarrhea, heartburn and melena.  Genitourinary: Negative.   Skin: Negative.   Neurological: Negative for dizziness, sensory change, loss of consciousness and headaches.  Psychiatric/Behavioral: Negative for depression. The patient is not nervous/anxious and does not have insomnia.     Physical Exam: Estimated body mass index is 36.58 kg/m as calculated from the following:   Height as of 10/01/18: 5' 5"  (1.651 m).   Weight as of 10/01/18: 219 lb 12.8 oz (99.7 kg). There were no vitals taken for this visit.  General Appearance: Well  nourished well developed, in no apparent distress.  Eyes: PERRLA, EOMs, conjunctiva no swelling or erythema ENT/Mouth: Ear canals normal without obstruction, swelling, erythema, or discharge.  TMs normal bilaterally with no erythema, bulging, retraction, or loss of landmark.  Oropharynx moist and clear with no exudate, erythema, or swelling.   Neck: Supple, thyroid normal. No bruits.  No cervical adenopathy Respiratory: Respiratory effort normal, Breath sounds clear A&P without wheeze, rhonchi, rales.   Cardio: RRR without murmurs, rubs or gallops. Brisk peripheral pulses without edema.  Chest: symmetric, with normal excursions Breasts: Deferred to obgyn Abdomen: Soft, nontender, no guarding, rebound, mild bulging superior of periumbilical region without defined hernia noted on exam.  Exam difficult secondary to body habitus, masses, or organomegaly.  Lymphatics: Non tender without lymphadenopathy.  Genitourinary: Deferred to Obgyn Musculoskeletal: Full ROM all peripheral extremities,5/5 strength, and normal gait.  Skin: Warm, dry without rashes, lesions, ecchymosis. Neuro: Awake and oriented X 3, Cranial nerves intact, reflexes equal bilaterally. Normal muscle tone, no cerebellar symptoms. Sensation intact.  Psych:  normal affect, Insight and Judgment appropriate.   EKG: WNL no changes.  AORTA SCAN: defer  Over 40 minutes of exam, counseling, chart review and critical decision making was performed  Vicie Mutters 1:35 PM [facility]

## 2019-01-14 ENCOUNTER — Encounter: Payer: Self-pay | Admitting: Internal Medicine

## 2019-01-14 ENCOUNTER — Encounter: Payer: Self-pay | Admitting: Physician Assistant

## 2019-01-18 ENCOUNTER — Other Ambulatory Visit: Payer: Self-pay | Admitting: Internal Medicine

## 2019-02-16 ENCOUNTER — Other Ambulatory Visit: Payer: Self-pay | Admitting: Physician Assistant

## 2019-04-03 ENCOUNTER — Other Ambulatory Visit: Payer: Self-pay

## 2019-04-03 ENCOUNTER — Ambulatory Visit (INDEPENDENT_AMBULATORY_CARE_PROVIDER_SITE_OTHER): Payer: Managed Care, Other (non HMO) | Admitting: Orthopedic Surgery

## 2019-04-03 ENCOUNTER — Ambulatory Visit (INDEPENDENT_AMBULATORY_CARE_PROVIDER_SITE_OTHER): Payer: Managed Care, Other (non HMO)

## 2019-04-03 ENCOUNTER — Encounter: Payer: Self-pay | Admitting: Orthopedic Surgery

## 2019-04-03 VITALS — Ht 65.0 in | Wt 210.0 lb

## 2019-04-03 DIAGNOSIS — M79644 Pain in right finger(s): Secondary | ICD-10-CM

## 2019-04-03 DIAGNOSIS — M67441 Ganglion, right hand: Secondary | ICD-10-CM

## 2019-04-04 ENCOUNTER — Encounter: Payer: Self-pay | Admitting: Orthopedic Surgery

## 2019-04-04 NOTE — Progress Notes (Signed)
Office Visit Note   Patient: Jenny Singleton           Date of Birth: [birth year]           MRN: [id number] Visit Date: 04/03/2019 Requested by: Unk Pinto, [address],  [place] [address] PCP: Unk Pinto, MD  Subjective: Chief Complaint  Patient presents with  . Right Middle Finger - Pain    HPI: Omeka is a right-hand-dominant patient with right long finger DIP cyst.'s been going on for about 2 weeks.'s been getting bigger and is painful and interferes with her writing and activities of daily living.  Hard for her to use scissors also.  Not taking any medication for pain.  She has done research about this on the Internet.              ROS: All systems reviewed are negative as they relate to the chief complaint within the history of present illness.  Patient denies  fevers or chills.   Assessment & Plan: Visit Diagnoses:  1. Pain of right middle finger   2. Digital mucous cyst of finger of right hand     Plan: Impression is ganglion cyst right DIP joint long finger.  It is quite tense.  We discussed aspiration versus excision versus observation.  She would like to have this removed.  The risk and benefits of removal are discussed including but limited to infection nerve vessel damage incomplete healing as well as worsening of pre-existing DIP joint arthritis.  In general she does have a little bit less range of motion at the DIP joint on the right compared to the left.  Patient understands risk benefits.  All questions answered.  Follow-Up Instructions: No follow-ups on file.   Orders:  Orders Placed This Encounter  Procedures  . XR Finger Middle Right   No orders of the defined types were placed in this encounter.     Procedures: No procedures performed   Clinical Data: No additional findings.  Objective: Vital Signs: Ht 5\' 5"  (1.651 m)   Wt 210 lb (95.3 kg)   BMI 34.95 kg/m   Physical Exam:   Constitutional: Patient appears  well-developed HEENT:  Head: Normocephalic Eyes:EOM are normal Neck: Normal range of motion Cardiovascular: Normal rate Pulmonary/chest: Effort normal Neurologic: Patient is alert Skin: Skin is warm Psychiatric: Patient has normal mood and affect    Ortho Exam: Ortho exam demonstrates intact extension and flexion of the right middle finger.  She does have a ganglion cyst on the dorsal radial aspect of the DIP joint.  The IP joint on that hand has about 20 degrees less flexion compared to the left-hand side.  Collaterals are stable.  No infection is present.  This is definitely a cystic structure and not any type of glomus tumor.  Specialty Comments:  No specialty comments available.  Imaging: XR Finger Middle Right  Result Date: 04/04/2019 AP lateral right middle finger reviewed.  Mild degenerative changes noted in the DIP and PIP joint.  No acute fracture dislocation or malalignment.  Bone density appears normal.    PMFS History: Patient Active Problem List   Diagnosis Date Noted  . Hyperlipidemia, mixed 01/03/2018  . Vitamin D deficiency 01/03/2018  . Medication management 01/03/2018  . 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 Transaminitis 07/10/2012  . Fatty liver 07/10/2012  . Iron deficiency anemia 07/10/2012  . Obesity 09/02/2010  . OBSTRUCTIVE SLEEP APNEA 06/12/2007  . Exercise induced  bronchospasm 06/12/2007  . Essential hypertension 12/01/2006   Past Medical History:  Diagnosis Date  . Colitis 07/11/2012  . Colon polyps 07/11/2012   TUBULOVILLOUS ADENOMA  . Diabetes mellitus   . Exercise-induced asthma    outside asthma   . Hernia   . Hyperlipidemia   . Hypertension   . Iron deficiency   . Kidney stones   . Obesity   . OSA on CPAP   . Vitamin D deficiency     Family History  Adopted: Yes  Problem Relation Age of Onset  . Colon cancer Father 64    Past Surgical History:  Procedure Laterality Date  . ABDOMINOPLASTY    . ABLATION    .  COLONOSCOPY    . COLONOSCOPY W/ BIOPSIES  07/11/2012  . ESOPHAGOGASTRODUODENOSCOPY  07/11/2012   Normal   . HERNIA REPAIR     2017, has had repaired x 3, [place]  . TONSILECTOMY, ADENOIDECTOMY, BILATERAL MYRINGOTOMY AND TUBES     Social History   Occupational History    Employer: [employer]   Tobacco Use  . Smoking status: Former Smoker    Packs/day: 1.00    Years: 2.00    Pack years: 2.00    Quit date: 03/21/1985    Years since quitting: 34.0  . Smokeless tobacco: Never Used  . Tobacco comment: smoked age [age]  Substance and Sexual Activity  . Alcohol use: Yes    Comment: rare  . Drug use: No  . Sexual activity: Yes    Birth control/protection: None

## 2019-04-05 ENCOUNTER — Telehealth: Payer: Self-pay | Admitting: Orthopedic Surgery

## 2019-04-05 NOTE — Telephone Encounter (Signed)
hmmm

## 2019-04-05 NOTE — Telephone Encounter (Signed)
FYI

## 2019-04-05 NOTE — Telephone Encounter (Signed)
The right middle finger cyst excision (60 minute case)  for Dr. Marlou Sa has been scheduled for this patient at [facility] for Jan 25th at [facility].  Numerous attempts have been made to contact patient to provide surgery information.  Voice mail messages left yesterday and today.  Today the instructions and information have been sent to patient's email address on file, which provides surgery location,date, time, and preparation/arrangements including post op appointment.  Should the patient not respond to email or make contact with our office to secure this surgery date, case will be cancelled and the case to follow on Jan 25th at 10am will move up.

## 2019-04-11 ENCOUNTER — Telehealth: Payer: Self-pay | Admitting: Orthopedic Surgery

## 2019-04-11 NOTE — Telephone Encounter (Signed)
fyi

## 2019-04-11 NOTE — Telephone Encounter (Signed)
FYI:    Patient was scheduled at [facility] on Jan 25th at [facility] for right middle finger cyst excision with Dr. Marlou Sa.  Patient sent me an email asking to cancel the surgery as she is unable to pay the 2k upfront and cancelling now would allow her to get her finances in order.  I did email the patient back providing an option to schedule this surgery at [facility] where she would be allowed to have the surgery and work on a payment plan without having to delay the procedure. At this time I have not heard back from the patient.

## 2019-04-11 NOTE — Telephone Encounter (Signed)
Ok thx.

## 2019-04-15 ENCOUNTER — Encounter: Payer: Managed Care, Other (non HMO) | Admitting: Physician Assistant

## 2019-04-15 ENCOUNTER — Encounter: Payer: Managed Care, Other (non HMO) | Admitting: Adult Health

## 2019-04-24 ENCOUNTER — Inpatient Hospital Stay: Payer: Managed Care, Other (non HMO) | Admitting: Orthopedic Surgery

## 2019-05-07 DIAGNOSIS — E1122 Type 2 diabetes mellitus with diabetic chronic kidney disease: Secondary | ICD-10-CM | POA: Insufficient documentation

## 2019-05-07 DIAGNOSIS — N182 Chronic kidney disease, stage 2 (mild): Secondary | ICD-10-CM | POA: Insufficient documentation

## 2019-05-07 DIAGNOSIS — E1169 Type 2 diabetes mellitus with other specified complication: Secondary | ICD-10-CM | POA: Insufficient documentation

## 2019-05-07 NOTE — Progress Notes (Deleted)
Complete Physical  Assessment and Plan:  Encounter for general adult medical examination with abnormal findings - CBC with Differential/Platelet - BASIC METABOLIC PANEL WITH GFR - Hepatic function panel - Magnesium  Essential hypertension - continue medications, DASH diet, exercise and monitor at home. Call if greater than 130/80.  - Urinalysis, Routine w reflex microscopic (not at [facility]) - Microalbumin / creatinine urine ratio - EKG 12-Lead - TSH  Type 2 diabetes mellitus with other diabetic kidney complication, without long-term current use of insulin ([place]) Discussed general issues about diabetes pathophysiology and management., Educational material distributed., Suggested low cholesterol diet., Encouraged aerobic exercise., Discussed foot care., Reminded to get yearly retinal exam. - Hemoglobin A1c   Iron deficiency anemia, unspecified - Iron and TIBC - Vitamin B12  Fatty liver Check labs, avoid tylenol, alcohol, weight loss advised.   Transaminitis -due to fatty liver   Obesity - - follow up 3 months for progress monitoring - long discussion about weight loss, diet, and exercise, will start the patient on phentermine- hand out given and AE's discussed, will do close follow up.   Hyperlipidemia -cont pravastatin - Lipid panel  Vitamin D deficiency -cont supplement - VITAMIN D 25 Hydroxy (Vit-D Deficiency, Fractures)  Discussed med's effects and SE's. Screening labs and tests as requested with regular follow-up as recommended.  HPI  54 y.o. female  presents for a complete physical.  Her blood pressure has been controlled at home, today their BP is  .  She does not workout. She denies chest pain, shortness of breath, dizziness.   BMI is There is no height or weight on file to calculate BMI., she is working on diet and exercise. Suppose to be on phentermine and topamax Wt Readings from Last 3 Encounters:  04/03/19 210 lb (95.3 kg)  10/01/18 219 lb 12.8 oz (99.7 kg)   09/03/18 217 lb (98.4 kg)   She has been working on diet and exercise for  diabetes with CKD on ACE/ARB Hyperlipidemia NOT at goal of less than 70- on lipitor 47m and vascepa  she is not on bASA Farxiga Metformin  Januvia  denies foot ulcerations, hyperglycemia, hypoglycemia , increased appetite, nausea, paresthesia of the feet, polydipsia, polyuria, visual disturbances, vomiting and weight loss Last A1C in the office was:  Lab Results  Component Value Date   HGBA1C 8.9 (H) 09/03/2018   Lab Results  Component Value Date   CHOL 172 09/03/2018   HDL 39 (L) 09/03/2018   LDLCALC 96 09/03/2018   TRIG 245 (H) 09/03/2018   CHOLHDL 4.4 09/03/2018    Lab Results  Component Value Date   GFRNONAA 69 09/03/2018   Patient is on Vitamin D supplement.   Lab Results  Component Value Date   VD25OH 75 09/03/2018      Current Medications:   Current Outpatient Medications (Endocrine & Metabolic):  .  dapagliflozin propanediol (FARXIGA) 10 MG TABS tablet, Take 1 tablet Daily for Diabetes .  metFORMIN (GLUCOPHAGE-XR) 500 MG 24 hr tablet, Take 2 tablets 2 x /day with Meals for Diabetes .  sitaGLIPtin (JANUVIA) 100 MG tablet, Take 1 tablet Daily for Diabetes  Current Outpatient Medications (Cardiovascular):  .  atorvastatin (LIPITOR) 20 MG tablet, Take 1 tablet Daily for Cholesterol .  Icosapent Ethyl (VASCEPA) 1 g CAPS, Take 2 capsules (2 g total) by mouth 2 (two) times daily. .Marland Kitchen lisinopril (ZESTRIL) 20 MG tablet, Take 1 tablet Daily for BP .  lisinopril-hydrochlorothiazide (ZESTORETIC) 20-12.5 MG tablet, Take 1 tablet Daily for  BP & Diabetic Kidney Protection   Current Outpatient Medications (Analgesics):  .  aspirin EC 81 MG tablet, Take 81 mg by mouth daily.   Current Outpatient Medications (Other):  .  bimatoprost (LUMIGAN) 0.03 % ophthalmic solution, Place 1 drop into both eyes at bedtime. Marland Kitchen  buPROPion (WELLBUTRIN XL) 150 MG 24 hr tablet, Take 1 tablet Daily for Mood .   Cholecalciferol (VITAMIN D-3) 5000 UNITS TABS, Take by mouth daily. Marland Kitchen  doxycycline (VIBRAMYCIN) 100 MG capsule, Take 1 capsule twice daily with food .  glucose blood test strip, Use as instructed .  glucose monitoring kit (FREESTYLE) monitoring kit, 1 each by Does not apply route as needed for other. .  Magnesium 500 MG CAPS, Take 1 capsule by mouth daily. Taking 956m daily .  omeprazole (PRILOSEC) 20 MG capsule, Take 1 capsule Daily for Indigestion & Heatburn .  phentermine (ADIPEX-P) 37.5 MG tablet, Take 1 tablet (37.5 mg total) by mouth daily before breakfast. .  sulfamethoxazole-trimethoprim (BACTRIM DS) 800-160 MG tablet, Take 1 tablet by mouth 2 (two) times daily. .Marland Kitchen topiramate (TOPAMAX) 100 MG tablet, Take 1 tablet 1 hour before sleep  Health Maintenance:   Immunization History  Administered Date(s) Administered  . Influenza Whole 12/19/2008  . PPD Test 05/23/2013  . Td 03/21/2006   Tetanus: 2008 DUE  Flu vaccine: 2016 [id number] ablation, no further periods.   Pap: 2017 MGM: 2018 DUE will see Dr. AOuida SillsDEXA: Not due Colonoscopy: 04/2017 EGD: 2014 Last Dental Ex[facility]    Patient Care Team: MUnk Pinto MD as PCP - General (Internal Medicine) SLadene Artist MD as Consulting Physician (Gastroenterology) RRemer Macho MD as Attending Physician (Ophthalmology) ALenore Cordia MD (Obstetrics and Gynecology) RFrederik Pear MD as Consulting Physician (Orthopedic Surgery) WRolan Bucco MD as Consulting Physician (Urology) FSherlyn Lick MD (Dermatology)   Medical History:  Past Medical History:  Diagnosis Date  . Colitis 07/11/2012  . Colon polyps 07/11/2012   TUBULOVILLOUS ADENOMA  . Diabetes mellitus   . Exercise-induced asthma    outside asthma   . Hernia   . Hyperlipidemia   . Hypertension   . Iron deficiency   . Kidney stones   . Obesity   . OSA on CPAP   . Vitamin D deficiency     Allergies Allergies  Allergen Reactions  . Welchol [Colesevelam Hcl] Hives    HIves    SURGICAL HISTORY She  has a past surgical history that includes Tonsilectomy, adenoidectomy, bilateral myringotomy and tubes; Ablation; Abdominoplasty; Esophagogastroduodenoscopy (07/11/2012); Colonoscopy w/ biopsies (07/11/2012); Hernia repair; and Colonoscopy. FAMILY HISTORY Her family history includes Colon cancer (age of onset: 740 in her father. She was adopted. SOCIAL HISTORY She  reports that she quit smoking about 34 years ago. She has a 2.00 pack-year smoking history. She has never used smokeless tobacco. She reports current alcohol use. She reports that she does not use drugs.  Review of Systems: Review of Systems  Constitutional: Negative for chills, fever and malaise/fatigue.  HENT: Negative for congestion, ear pain and sore throat.   Eyes: Negative.   Respiratory: Negative for cough, shortness of breath and wheezing.   Cardiovascular: Negative for chest pain, palpitations and leg swelling.  Gastrointestinal: Negative for abdominal pain, blood in stool, constipation, diarrhea, heartburn and melena.  Genitourinary: Negative.   Skin: Negative.   Neurological: Negative for dizziness, sensory change, loss of consciousness and headaches.  Psychiatric/Behavioral: Negative for depression.  The patient is not nervous/anxious and does not have insomnia.     Physical Exam: Estimated body mass index is 34.95 kg/m as calculated from the following:   Height as of 04/03/19: _0  (1.651 m).   Weight as of 04/03/19: 210 lb (95.3 kg). There were no vitals taken for this visit.  General Appearance: Well nourished well developed, in no apparent distress.  Eyes: PERRLA, EOMs, conjunctiva no swelling or erythema ENT/Mouth: Ear canals normal without obstruction, swelling, erythema, or discharge.  TMs normal bilaterally with no erythema, bulging, retraction, or loss of landmark.  Oropharynx moist and  clear with no exudate, erythema, or swelling.   Neck: Supple, thyroid normal. No bruits.  No cervical adenopathy Respiratory: Respiratory effort normal, Breath sounds clear A&P without wheeze, rhonchi, rales.   Cardio: RRR without murmurs, rubs or gallops. Brisk peripheral pulses without edema.  Chest: symmetric, with normal excursions Breasts: Deferred to obgyn Abdomen: Soft, nontender, no guarding, rebound, mild bulging superior of periumbilical region without defined hernia noted on exam.  Exam difficult secondary to body habitus, masses, or organomegaly.  Lymphatics: Non tender without lymphadenopathy.  Genitourinary: Deferred to Obgyn Musculoskeletal: Full ROM all peripheral extremities,5/5 strength, and normal gait.  Skin: Warm, dry without rashes, lesions, ecchymosis. Neuro: Awake and oriented X 3, Cranial nerves intact, reflexes equal bilaterally. Normal muscle tone, no cerebellar symptoms. Sensation intact.  Psych:  normal affect, Insight and Judgment appropriate.   EKG: WNL no changes.  AORTA SCAN: defer  Over 40 minutes of exam, counseling, chart review and critical decision making was performed  Vicie Mutters 12:41 PM [facility]

## 2019-05-09 ENCOUNTER — Encounter: Payer: Managed Care, Other (non HMO) | Admitting: Physician Assistant

## 2019-05-10 NOTE — Progress Notes (Signed)
Complete Physical  Assessment and Plan:  Encounter for general adult medical examination with abnormal findings - CBC with Differential/Platelet - BASIC METABOLIC PANEL WITH GFR - Hepatic function panel - Magnesium  Essential hypertension - continue medications, DASH diet, exercise and monitor at home. Call if greater than 130/80.  - Urinalysis, Routine w reflex microscopic (not at [facility]) - Microalbumin / creatinine urine ratio - EKG 12-Lead - TSH  Type 2 diabetes mellitus with other diabetic kidney complication, without long-term current use of insulin ([place]) Discussed general issues about diabetes pathophysiology and management., Educational material distributed., Suggested low cholesterol diet., Encouraged aerobic exercise., Discussed foot care., Reminded to get yearly retinal exam. - Hemoglobin A1c - will do trial of rybelsus for weight loss- does not want injectable   Iron deficiency anemia, unspecified - Iron and TIBC - Vitamin B12  Fatty liver Check labs, avoid tylenol, alcohol, weight loss advised.   Transaminitis -due to fatty liver   Obesity - - follow up 3 months for progress monitoring - long discussion about weight loss, diet, and exercise, will start the patient on phentermine- hand out given and AE's discussed, will do close follow up.   Hyperlipidemia -cont pravastatin - Lipid panel  Vitamin D deficiency -cont supplement - VITAMIN D 25 Hydroxy (Vit-D Deficiency, Fractures)  Discussed med's effects and SE's. Screening labs and tests as requested with regular follow-up as recommended.  HPI  54 y.o. female  presents for a complete physical.  Her blood pressure has been controlled at home, today their BP is BP: 120/90.  She does not workout. She denies chest pain, shortness of breath, dizziness.   Her mom passed in Oct 30th, she is still dealing with that, has not done probate or her house.   BMI is Body mass index is 37.42 kg/m., she is working on diet  and exercise. Has been on phentermine and topamax in the past, and she would like to restart phentermine- her son is getting married in April.  Wt Readings from Last 3 Encounters:  05/13/19 218 lb (98.9 kg)  04/03/19 210 lb (95.3 kg)  10/01/18 219 lb 12.8 oz (99.7 kg)   She has been working on diet and exercise for  diabetes with CKD on ACE/ARB Hyperlipidemia NOT at goal of less than 70- on lipitor 89m and vascepa  she is not on bASA Farxiga Metformin  Januvia  denies foot ulcerations, hyperglycemia, hypoglycemia , increased appetite, nausea, paresthesia of the feet, polydipsia, polyuria, visual disturbances, vomiting and weight loss  Last A1C in the office was:  Lab Results  Component Value Date   HGBA1C 8.9 (H) 09/03/2018   Lab Results  Component Value Date   CHOL 172 09/03/2018   HDL 39 (L) 09/03/2018   LDLCALC 96 09/03/2018   TRIG 245 (H) 09/03/2018   CHOLHDL 4.4 09/03/2018    Lab Results  Component Value Date   GFRNONAA 69 09/03/2018   Patient is on Vitamin D supplement.   Lab Results  Component Value Date   VD25OH 75 09/03/2018      Current Medications:   Current Outpatient Medications (Endocrine & Metabolic):  .  dapagliflozin propanediol (FARXIGA) 10 MG TABS tablet, Take 1 tablet Daily for Diabetes .  metFORMIN (GLUCOPHAGE-XR) 500 MG 24 hr tablet, Take 2 tablets 2 x /day with Meals for Diabetes .  sitaGLIPtin (JANUVIA) 100 MG tablet, Take 1 tablet Daily for Diabetes  Current Outpatient Medications (Cardiovascular):  .  atorvastatin (LIPITOR) 20 MG tablet, Take 1 tablet Daily  for Cholesterol .  Icosapent Ethyl (VASCEPA) 1 g CAPS, Take 2 capsules (2 g total) by mouth 2 (two) times daily. Marland Kitchen  lisinopril (ZESTRIL) 20 MG tablet, Take 1 tablet Daily for BP .  lisinopril-hydrochlorothiazide (ZESTORETIC) 20-12.5 MG tablet, Take 1 tablet Daily for BP & Diabetic Kidney Protection   Current Outpatient Medications (Analgesics):  .  aspirin EC 81 MG tablet, Take 81 mg  by mouth daily.   Current Outpatient Medications (Other):  .  bimatoprost (LUMIGAN) 0.03 % ophthalmic solution, Place 1 drop into both eyes at bedtime. Marland Kitchen  buPROPion (WELLBUTRIN XL) 150 MG 24 hr tablet, Take 1 tablet Daily for Mood .  Cholecalciferol (VITAMIN D-3) 5000 UNITS TABS, Take by mouth daily. Marland Kitchen  glucose blood test strip, Use as instructed .  glucose monitoring kit (FREESTYLE) monitoring kit, 1 each by Does not apply route as needed for other. .  Magnesium 500 MG CAPS, Take 1 capsule by mouth daily. Taking 919m daily .  omeprazole (PRILOSEC) 20 MG capsule, Take 1 capsule Daily for Indigestion & Heatburn .  phentermine (ADIPEX-P) 37.5 MG tablet, Take 1 tablet (37.5 mg total) by mouth daily before breakfast.  Health Maintenance:   Immunization History  Administered Date(s) Administered  . Influenza Whole 12/19/2008  . PPD Test 05/23/2013  . Td 03/21/2006   Tetanus: 2008 declines Flu vaccine: 12/2018 [id number] ablation, no further periods.   Pap: 2020 MGM: 04/2018 will see Dr. AOuida SillsDEXA: Not due Colonoscopy: 04/2017 EGD: 2014 Last Dental Ex[facility]    Patient Care Team: MUnk Pinto MD as PCP - General (Internal Medicine) SLadene Artist MD as Consulting Physician (Gastroenterology) RRemer Macho MD as Attending Physician (Ophthalmology) ALenore Cordia MD (Obstetrics and Gynecology) RFrederik Pear MD as Consulting Physician (Orthopedic Surgery) WRolan Bucco MD as Consulting Physician (Urology) FSherlyn Lick MD (Dermatology)   Medical History:  Past Medical History:  Diagnosis Date  . Colitis 07/11/2012  . Colon polyps 07/11/2012   TUBULOVILLOUS ADENOMA  . Diabetes mellitus   . Exercise-induced asthma    outside asthma   . Hernia   . Hyperlipidemia   . Hypertension   . Iron deficiency   . Kidney stones   . Obesity   . OSA on CPAP   . Vitamin D deficiency    Allergies Allergies   Allergen Reactions  . Welchol [Colesevelam Hcl] Hives    HIves    SURGICAL HISTORY She  has a past surgical history that includes Tonsilectomy, adenoidectomy, bilateral myringotomy and tubes; Ablation; Abdominoplasty; Esophagogastroduodenoscopy (07/11/2012); Colonoscopy w/ biopsies (07/11/2012); Hernia repair; and Colonoscopy. FAMILY HISTORY Her family history includes Colon cancer (age of onset: 750 in her father. She was adopted. SOCIAL HISTORY She  reports that she quit smoking about 34 years ago. She has a 2.00 pack-year smoking history. She has never used smokeless tobacco. She reports current alcohol use. She reports that she does not use drugs.  Review of Systems: Review of Systems  Constitutional: Negative for chills, fever and malaise/fatigue.  HENT: Negative for congestion, ear pain and sore throat.   Eyes: Negative.   Respiratory: Negative for cough, shortness of breath and wheezing.   Cardiovascular: Negative for chest pain, palpitations and leg swelling.  Gastrointestinal: Negative for abdominal pain, blood in stool, constipation, diarrhea, heartburn and melena.  Genitourinary: Negative.   Skin: Negative.   Neurological: Negative for dizziness, sensory change, loss of consciousness and headaches.  Psychiatric/Behavioral: Negative for depression.  The patient is not nervous/anxious and does not have insomnia.     Physical Exam: Estimated body mass index is 37.42 kg/m as calculated from the following:   Height as of this encounter: _0  (1.626 m).   Weight as of this encounter: 218 lb (98.9 kg). BP 120/90   Pulse 93   Temp (!) 97.5 F (36.4 C)   Ht _1  (1.626 m)   Wt 218 lb (98.9 kg)   SpO2 98%   BMI 37.42 kg/m   General Appearance: Well nourished well developed, in no apparent distress.  Eyes: PERRLA, EOMs, conjunctiva no swelling or erythema ENT/Mouth: Ear canals normal without obstruction, swelling, erythema, or discharge.  TMs normal bilaterally with no  erythema, bulging, retraction, or loss of landmark.  Oropharynx moist and clear with no exudate, erythema, or swelling.   Neck: Supple, thyroid normal. No bruits.  No cervical adenopathy Respiratory: Respiratory effort normal, Breath sounds clear A&P without wheeze, rhonchi, rales.   Cardio: RRR without murmurs, rubs or gallops. Brisk peripheral pulses without edema.  Chest: symmetric, with normal excursions Breasts: Deferred to obgyn Abdomen: Soft, nontender, no guarding, rebound, mild bulging superior of periumbilical region without defined hernia noted on exam.  Exam difficult secondary to body habitus, masses, or organomegaly.  Lymphatics: Non tender without lymphadenopathy.  Genitourinary: Deferred to Obgyn Musculoskeletal: Full ROM all peripheral extremities,5/5 strength, and normal gait.  Skin: Warm, dry without rashes, lesions, ecchymosis. Neuro: Awake and oriented X 3, Cranial nerves intact, reflexes equal bilaterally. Normal muscle tone, no cerebellar symptoms. Sensation intact.  Psych:  normal affect, Insight and Judgment appropriate.   EKG: WNL no changes.  AORTA SCAN: defer  Over 40 minutes of exam, counseling, chart review and critical decision making was performed  Vicie Mutters 10:25 AM [facility]

## 2019-05-13 ENCOUNTER — Encounter: Payer: Self-pay | Admitting: Physician Assistant

## 2019-05-13 ENCOUNTER — Other Ambulatory Visit: Payer: Self-pay

## 2019-05-13 ENCOUNTER — Ambulatory Visit: Payer: Managed Care, Other (non HMO) | Admitting: Physician Assistant

## 2019-05-13 VITALS — BP 120/90 | HR 93 | Temp 97.5°F | Ht 64.0 in | Wt 218.0 lb

## 2019-05-13 DIAGNOSIS — J45990 Exercise induced bronchospasm: Secondary | ICD-10-CM

## 2019-05-13 DIAGNOSIS — Z136 Encounter for screening for cardiovascular disorders: Secondary | ICD-10-CM | POA: Diagnosis not present

## 2019-05-13 DIAGNOSIS — E559 Vitamin D deficiency, unspecified: Secondary | ICD-10-CM

## 2019-05-13 DIAGNOSIS — Z13220 Encounter for screening for lipoid disorders: Secondary | ICD-10-CM | POA: Diagnosis not present

## 2019-05-13 DIAGNOSIS — Z130 Encounter for screening for diseases of the blood and blood-forming organs and certain disorders involving the immune mechanism: Secondary | ICD-10-CM

## 2019-05-13 DIAGNOSIS — E782 Mixed hyperlipidemia: Secondary | ICD-10-CM

## 2019-05-13 DIAGNOSIS — Z131 Encounter for screening for diabetes mellitus: Secondary | ICD-10-CM

## 2019-05-13 DIAGNOSIS — Z79899 Other long term (current) drug therapy: Secondary | ICD-10-CM

## 2019-05-13 DIAGNOSIS — Z1389 Encounter for screening for other disorder: Secondary | ICD-10-CM

## 2019-05-13 DIAGNOSIS — Z0000 Encounter for general adult medical examination without abnormal findings: Secondary | ICD-10-CM | POA: Diagnosis not present

## 2019-05-13 DIAGNOSIS — G4733 Obstructive sleep apnea (adult) (pediatric): Secondary | ICD-10-CM

## 2019-05-13 DIAGNOSIS — I10 Essential (primary) hypertension: Secondary | ICD-10-CM | POA: Diagnosis not present

## 2019-05-13 DIAGNOSIS — Z0001 Encounter for general adult medical examination with abnormal findings: Secondary | ICD-10-CM

## 2019-05-13 DIAGNOSIS — E1169 Type 2 diabetes mellitus with other specified complication: Secondary | ICD-10-CM

## 2019-05-13 DIAGNOSIS — D509 Iron deficiency anemia, unspecified: Secondary | ICD-10-CM

## 2019-05-13 DIAGNOSIS — K760 Fatty (change of) liver, not elsewhere classified: Secondary | ICD-10-CM

## 2019-05-13 DIAGNOSIS — R7401 Elevation of levels of liver transaminase levels: Secondary | ICD-10-CM

## 2019-05-13 DIAGNOSIS — E785 Hyperlipidemia, unspecified: Secondary | ICD-10-CM

## 2019-05-13 DIAGNOSIS — E1129 Type 2 diabetes mellitus with other diabetic kidney complication: Secondary | ICD-10-CM

## 2019-05-13 MED ORDER — RYBELSUS 3 MG PO TABS: 3.0000 mg | ORAL_TABLET | Freq: Every day | ORAL | 0 refills | Status: DC

## 2019-05-13 MED ORDER — RYBELSUS 7 MG PO TABS: 7.0000 mg | ORAL_TABLET | ORAL | 0 refills | Status: DC

## 2019-05-13 MED ORDER — PHENTERMINE HCL 37.5 MG PO TABS: 37.5000 mg | ORAL_TABLET | Freq: Every day | ORAL | 2 refills | Status: DC

## 2019-05-13 NOTE — Patient Instructions (Addendum)
We will start you on Rybelsus You have to take 3 mg for 1 month- then go up to 7 mg int he 2nd month Take it with water only in the morning 30 mins before food  It should NOT be run through your insurance, get the pharmacy to run it with the savings card that you can get from one of these areas below  Text READY to [phone] to get a copay savings and text message to help you start RYBELSUS  Or visit [email or web address] to download a Savings Card   Phentermine  While taking the medication we may ask that you come into the office once a month. The first month we will get an EKG on you.   PLEASE BRING A FOOD LONG TO THE FIRST VISIT.   It is helpful if you bring in a food diary or use an app on your phone such as myfitnesspal to record your calorie intake, especially in the beginning.  After that first initial visit, we will want to see you once a month for accountability.  In addition we can help answer your questions about diet, exercise, and help you every step of the way with your weight loss journey.  HOW TO START THE MEDICATION You can start out on 1/2 a pill in the morning  FOR THE FIRST MONTH.  WE WILL THEN INCREASE TO 1 PILL AFTER THAT 1ST VISIT.   COST OF THE MEDICATION This medication is cheapest CASH pay at [facility] is 14-17 dollars and you do NOT need a membership to get meds from there.   SIDE EFFECTS It causes dry mouth and constipation in almost every patient, so try to get 80-100 oz of water a day and increase fiber such as veggies. You can add on a stool softener if you would like.   It can give you energy however it can also cause some people to be shaky, anxious or have palpitations. Stop this medication if that happens and contact the office.   If this medication does not work for you there are several medications that we can try to help rewire your brain in addition to making healthier habits.   What is this medicine? PHENTERMINE (FEN ter  meen) decreases your appetite. This medicine is intended to be used in addition to a healthy reduced calorie diet and exercise. The best results are achieved this way. This medicine is only indicated for short-term use. Eventually your weight loss may level out and the medication will no longer be needed.   How should I use this medicine? Take this medicine by mouth. Follow the directions on the prescription label. The tablets should stay in the bottle until immediately before you take your dose. Take your doses at regular intervals. Do not take your medicine more often than directed.  Overdosage: If you think you have taken too much of this medicine contact a poison control center or emergency room at once. NOTE: This medicine is only for you. Do not share this medicine with others.  What if I miss a dose? If you miss a dose, take it as soon as you can. If it is almost time for your next dose, take only that dose. Do not take double or extra doses. Do not increase or in any way change your dose without consulting your doctor.  What should I watch for while using this medicine? Notify your physician immediately if you become short of breath while doing your normal  activities. Do not take this medicine within 6 hours of bedtime. It can keep you from getting to sleep. Avoid drinks that contain caffeine and try to stick to a regular bedtime every night. Do not stand or sit up quickly, especially if you are an older patient. This reduces the risk of dizzy or fainting spells. Avoid alcoholic drinks.  What side effects may I notice from receiving this medicine? Side effects that you should report to your doctor or health care professional as soon as possible: -chest pain, palpitations -depression or severe changes in mood -increased blood pressure -irritability -nervousness or restlessness -severe dizziness -shortness of breath -problems urinating -unusual swelling of the legs -vomiting  Side  effects that usually do not require medical attention (report to your doctor or health care professional if they continue or are bothersome): -blurred vision or other eye problems -changes in sexual ability or desire -constipation or diarrhea -difficulty sleeping -dry mouth or unpleasant taste -headache -nausea This list may not describe all possible side effects. Call your doctor for medical advice about side effects. You may report side effects to FDA at 1-800-FDA-1088.   If an appointment is not available please go to [email or web address] to sign up for notification when additional vaccine appointments are available.   [facility] and [facility] is going to start caring the vaccine for patients 59 and older so please check in with your local pharmacy to see if they will be starting to give the vaccines.     If you have further questions or concerns about the vaccine process, please visit  [email or web address]  Individuals seeking information about the vaccines and state's phased distribution plan can learn more by going to - [email or web address]  [email or web address]

## 2019-05-14 LAB — COMPLETE METABOLIC PANEL WITH GFR
AG Ratio: 1.8 (calc) (ref 1.0–2.5)
ALT: 53 U/L — ABNORMAL HIGH (ref 6–29)
AST: 35 U/L (ref 10–35)
Albumin: 4.6 g/dL (ref 3.6–5.1)
Alkaline phosphatase (APISO): 67 U/L (ref 37–153)
BUN: 15 mg/dL (ref 7–25)
CO2: 29 mmol/L (ref 20–32)
Calcium: 10.5 mg/dL — ABNORMAL HIGH (ref 8.6–10.4)
Chloride: 101 mmol/L (ref 98–110)
Creat: 0.75 mg/dL (ref 0.50–1.05)
GFR, Est African American: 105 mL/min/{1.73_m2} (ref >=60)
GFR, Est Non African American: 91 mL/min/{1.73_m2} (ref >=60)
Globulin: 2.6 g/dL (calc) (ref 1.9–3.7)
Glucose, Bld: 160 mg/dL — ABNORMAL HIGH (ref 65–99)
Potassium: 4.7 mmol/L (ref 3.5–5.3)
Sodium: 142 mmol/L (ref 135–146)
Total Bilirubin: 0.7 mg/dL (ref 0.2–1.2)
Total Protein: 7.2 g/dL (ref 6.1–8.1)

## 2019-05-14 LAB — CBC WITH DIFFERENTIAL/PLATELET
Absolute Monocytes: 307 cells/uL (ref 200–950)
Basophils Absolute: 51 cells/uL (ref 0–200)
Basophils Relative: 0.7 %
Eosinophils Absolute: 394 cells/uL (ref 15–500)
Eosinophils Relative: 5.4 %
HCT: 45.5 % — ABNORMAL HIGH (ref 35.0–45.0)
Hemoglobin: 15.2 g/dL (ref 11.7–15.5)
Lymphs Abs: 2686 cells/uL (ref 850–3900)
MCH: 28.3 pg (ref 27.0–33.0)
MCHC: 33.4 g/dL (ref 32.0–36.0)
MCV: 84.6 fL (ref 80.0–100.0)
MPV: 10.1 fL (ref 7.5–12.5)
Monocytes Relative: 4.2 %
Neutro Abs: 3862 cells/uL (ref 1500–7800)
Neutrophils Relative %: 52.9 %
Platelets: 309 10*3/uL (ref 140–400)
RBC: 5.38 10*6/uL — ABNORMAL HIGH (ref 3.80–5.10)
RDW: 13.7 % (ref 11.0–15.0)
Total Lymphocyte: 36.8 %
WBC: 7.3 10*3/uL (ref 3.8–10.8)

## 2019-05-14 LAB — URINALYSIS, ROUTINE W REFLEX MICROSCOPIC
Bilirubin Urine: NEGATIVE
Hgb urine dipstick: NEGATIVE
Ketones, ur: NEGATIVE
Leukocytes,Ua: NEGATIVE
Nitrite: NEGATIVE
Protein, ur: NEGATIVE
Specific Gravity, Urine: 1.033 (ref 1.001–1.03)
pH: <=5 (ref 5.0–8.0)

## 2019-05-14 LAB — HEMOGLOBIN A1C
Hgb A1c MFr Bld: 10 % of total Hgb — ABNORMAL HIGH (ref <5.7)
Mean Plasma Glucose: 240 (calc)
eAG (mmol/L): 13.3 (calc)

## 2019-05-14 LAB — MICROALBUMIN / CREATININE URINE RATIO
Creatinine, Urine: 70 mg/dL (ref 20–275)
Microalb Creat Ratio: 7 mcg/mg creat (ref <30)
Microalb, Ur: 0.5 mg/dL

## 2019-05-14 LAB — IRON, TOTAL/TOTAL IRON BINDING CAP
%SAT: 19 % (calc) (ref 16–45)
Iron: 73 ug/dL (ref 45–160)
TIBC: 391 mcg/dL (calc) (ref 250–450)

## 2019-05-14 LAB — LIPID PANEL
Cholesterol: 181 mg/dL (ref <200)
HDL: 43 mg/dL — ABNORMAL LOW (ref >=50)
LDL Cholesterol (Calc): 97 mg/dL (calc)
Non-HDL Cholesterol (Calc): 138 mg/dL (calc) — ABNORMAL HIGH (ref <130)
Total CHOL/HDL Ratio: 4.2 (calc) (ref <5.0)
Triglycerides: 285 mg/dL — ABNORMAL HIGH (ref <150)

## 2019-05-14 LAB — TSH: TSH: 0.81 mIU/L

## 2019-05-14 LAB — VITAMIN B12: Vitamin B-12: 375 pg/mL (ref 200–1100)

## 2019-05-14 LAB — VITAMIN D 25 HYDROXY (VIT D DEFICIENCY, FRACTURES): Vit D, 25-Hydroxy: 54 ng/mL (ref 30–100)

## 2019-05-14 LAB — MAGNESIUM: Magnesium: 2 mg/dL (ref 1.5–2.5)

## 2019-06-11 ENCOUNTER — Other Ambulatory Visit: Payer: Self-pay | Admitting: Internal Medicine

## 2019-06-24 ENCOUNTER — Ambulatory Visit: Payer: Managed Care, Other (non HMO) | Admitting: Physician Assistant

## 2019-06-27 NOTE — Progress Notes (Signed)
54 y.o.female presents for a follow up after being on phentermine for weight loss.   Her son is getting married this month so she wanted to try some weight loss, has tolerated  Phentermine in the past and started on Rybelsus 3 mg, just recently started on Rybelsus 7 mg for a week. She has not been on the phentermine, not wanting to introduce too many things.  While on the medication they have lost 3 lbs since last visit which was 4 weeks ago.  They deny palpitations, anxiety, trouble sleeping, elevated BP.   BP Readings from Last 3 Encounters:  07/01/19 128/76  05/13/19 120/90  10/01/18 130/76   BMI is Body mass index is 36.9 kg/m., she is working on diet and exercise. Wt Readings from Last 3 Encounters:  07/01/19 215 lb (97.5 kg)  05/13/19 218 lb (98.9 kg)  04/03/19 210 lb (95.3 kg)    Lab Results  Component Value Date   HGBA1C 10.0 (H) 05/13/2019    Medications:  Current Outpatient Medications (Endocrine & Metabolic):  .  dapagliflozin propanediol (FARXIGA) 10 MG TABS tablet, Take 1 tablet Daily for Diabetes .  metFORMIN (GLUCOPHAGE-XR) 500 MG 24 hr tablet, Take 2 tablets 2 x /day with Meals for Diabetes .  Semaglutide (RYBELSUS) 7 MG TABS, Take 7 mg by mouth every morning. .  sitaGLIPtin (JANUVIA) 100 MG tablet, Take 1 tablet Daily for Diabetes  Current Outpatient Medications (Cardiovascular):  .  atorvastatin (LIPITOR) 20 MG tablet, Take 1 tablet Daily for Cholesterol .  lisinopril (ZESTRIL) 20 MG tablet, Take 1 tablet Daily for BP .  lisinopril-hydrochlorothiazide (ZESTORETIC) 20-12.5 MG tablet, Take 1 tablet Daily for BP & Diabetic Kidney Protection   Current Outpatient Medications (Analgesics):  .  aspirin EC 81 MG tablet, Take 81 mg by mouth daily.   Current Outpatient Medications (Other):  .  bimatoprost (LUMIGAN) 0.03 % ophthalmic solution, Place 1 drop into both eyes at bedtime. Marland Kitchen  buPROPion (WELLBUTRIN XL) 150 MG 24 hr tablet, Take 1 tablet every Morning for  Mood, Focus & Concentration. .  Cholecalciferol (VITAMIN D-3) 5000 UNITS TABS, Take by mouth daily. Marland Kitchen  glucose blood test strip, Use as instructed .  glucose monitoring kit (FREESTYLE) monitoring kit, 1 each by Does not apply route as needed for other. .  Magnesium 500 MG CAPS, Take 1 capsule by mouth daily. Taking 956m daily .  omeprazole (PRILOSEC) 20 MG capsule, Take 1 capsule Daily for Indigestion & Heatburn .  phentermine (ADIPEX-P) 37.5 MG tablet, Take 1 tablet (37.5 mg total) by mouth daily before breakfast.  ROS: All negative except for above  Physical exam: Vitals:   07/01/19 1607  BP: 128/76  Pulse: 72  Temp: (!) 97.5 F (36.4 C)  SpO2: 98%   Physical Exam Constitutional:      Appearance: She is well-developed.  HENT:     Head: Normocephalic and atraumatic.     Right Ear: External ear normal.     Left Ear: External ear normal.  Eyes:     Conjunctiva/sclera: Conjunctivae normal.     Pupils: Pupils are equal, round, and reactive to light.  Neck:     Thyroid: No thyromegaly.  Cardiovascular:     Rate and Rhythm: Normal rate and regular rhythm.     Heart sounds: Normal heart sounds. No murmur. No friction rub. No gallop.   Pulmonary:     Effort: Pulmonary effort is normal. No respiratory distress.     Breath sounds: Normal breath sounds.  No wheezing.  Abdominal:     General: Bowel sounds are normal. There is no distension.     Palpations: Abdomen is soft. There is no mass.     Tenderness: There is no abdominal tenderness. There is no guarding or rebound.  Musculoskeletal:        General: Normal range of motion.     Cervical back: Normal range of motion and neck supple.  Lymphadenopathy:     Cervical: No cervical adenopathy.  Skin:    General: Skin is warm and dry.  Neurological:     Mental Status: She is alert and oriented to person, place, and time.     Cranial Nerves: No cranial nerve deficit.     Coordination: Coordination normal.     Deep Tendon  Reflexes: Reflexes normal.     Assessment: Obesity with co morbid conditions.  Diabetes  Plan: She will work on meal planning, intentional eating, and increasing water.  She has been instructed to work up to a goal of 150 minutes of combined cardio and strengthening exercise per week for weight loss and overall health benefits. We discussed the following Behavioral Modification Strategies today: increasing lean protein intake, decreasing simple carbohydrates, increasing vegetables, increase H20 intake, decrease eating out, no skipping meals, work on meal planning and easy cooking plans, keeping healthy foods in the home, and planning for success.   She has agreed to follow-up with our clinic in 2 months. She was informed of the importance of frequent follow-up visits to maximize her success with intensive lifestyle modifications for her multiple health conditions.  Medication refill: phentermine start on 1/2 pill and continue 7 mg of the Reybelsus  Future Appointments  Date Time Provider [place]  08/26/2019  8:45 AM Vicie Mutters, PA-C [id number] None

## 2019-07-01 ENCOUNTER — Ambulatory Visit: Payer: Managed Care, Other (non HMO) | Admitting: Physician Assistant

## 2019-07-01 ENCOUNTER — Encounter: Payer: Self-pay | Admitting: Physician Assistant

## 2019-07-01 ENCOUNTER — Other Ambulatory Visit: Payer: Self-pay

## 2019-07-01 DIAGNOSIS — E785 Hyperlipidemia, unspecified: Secondary | ICD-10-CM

## 2019-07-01 DIAGNOSIS — E1129 Type 2 diabetes mellitus with other diabetic kidney complication: Secondary | ICD-10-CM

## 2019-07-01 DIAGNOSIS — E1169 Type 2 diabetes mellitus with other specified complication: Secondary | ICD-10-CM | POA: Diagnosis not present

## 2019-07-01 MED ORDER — RYBELSUS 7 MG PO TABS: 7.0000 mg | ORAL_TABLET | ORAL | 2 refills | Status: DC

## 2019-07-01 NOTE — Patient Instructions (Addendum)
Check out  Mini habits for weight loss book  2 free apps for tracking food is myfitness pal  loseit  If you want more structured weight loss that you have to pay for, you can look into  [employer]  weight watchers  Phentermine  While taking the medication we may ask that you come into the office once a month. The first month we will get an EKG on you.   PLEASE BRING A FOOD LONG TO THE FIRST VISIT.   It is helpful if you bring in a food diary or use an app on your phone such as myfitnesspal to record your calorie intake, especially in the beginning.  After that first initial visit, we will want to see you once a month for accountability.  In addition we can help answer your questions about diet, exercise, and help you every step of the way with your weight loss journey.  HOW TO START THE MEDICATION You can start out on 1/2 a pill in the morning  FOR THE FIRST MONTH.  WE WILL THEN INCREASE TO 1 PILL AFTER THAT 1ST VISIT.   COST OF THE MEDICATION This medication is cheapest CASH pay at [facility] is 14-17 dollars and you do NOT need a membership to get meds from there.   SIDE EFFECTS It causes dry mouth and constipation in almost every patient, so try to get 80-100 oz of water a day and increase fiber such as veggies. You can add on a stool softener if you would like.   It can give you energy however it can also cause some people to be shaky, anxious or have palpitations. Stop this medication if that happens and contact the office.   If this medication does not work for you there are several medications that we can try to help rewire your brain in addition to making healthier habits.   What is this medicine? PHENTERMINE (FEN ter meen) decreases your appetite. This medicine is intended to be used in addition to a healthy reduced calorie diet and exercise. The best results are achieved this way. This medicine is only indicated for short-term use. Eventually your weight  loss may level out and the medication will no longer be needed.   How should I use this medicine? Take this medicine by mouth. Follow the directions on the prescription label. The tablets should stay in the bottle until immediately before you take your dose. Take your doses at regular intervals. Do not take your medicine more often than directed.  Overdosage: If you think you have taken too much of this medicine contact a poison control center or emergency room at once. NOTE: This medicine is only for you. Do not share this medicine with others.  What if I miss a dose? If you miss a dose, take it as soon as you can. If it is almost time for your next dose, take only that dose. Do not take double or extra doses. Do not increase or in any way change your dose without consulting your doctor.  What should I watch for while using this medicine? Notify your physician immediately if you become short of breath while doing your normal activities. Do not take this medicine within 6 hours of bedtime. It can keep you from getting to sleep. Avoid drinks that contain caffeine and try to stick to a regular bedtime every night. Do not stand or sit up quickly, especially if you are an older patient. This reduces  the risk of dizzy or fainting spells. Avoid alcoholic drinks.  What side effects may I notice from receiving this medicine? Side effects that you should report to your doctor or health care professional as soon as possible: -chest pain, palpitations -depression or severe changes in mood -increased blood pressure -irritability -nervousness or restlessness -severe dizziness -shortness of breath -problems urinating -unusual swelling of the legs -vomiting  Side effects that usually do not require medical attention (report to your doctor or health care professional if they continue or are bothersome): -blurred vision or other eye problems -changes in sexual ability or desire -constipation or  diarrhea -difficulty sleeping -dry mouth or unpleasant taste -headache -nausea This list may not describe all possible side effects. Call your doctor for medical advice about side effects. You may report side effects to FDA at 1-800-FDA-1088.  General eating tips  What to Avoid . Avoid added sugars o Often added sugar can be found in processed foods such as many condiments, dry cereals, cakes, cookies, chips, crisps, crackers, candies, sweetened drinks, etc.  o Read labels and AVOID/DECREASE use of foods with the following in their ingredient list: Sugar, fructose, high fructose corn syrup, sucrose, glucose, maltose, dextrose, molasses, cane sugar, brown sugar, any type of syrup, agave nectar, etc.   . Avoid snacking in between meals- drink water or if you feel you need a snack, pick a high water content snack such as cucumbers, watermelon, or any veggie.  Marland Kitchen Avoid foods made with flour o If you are going to eat food made with flour, choose those made with whole-grains; and, minimize your consumption as much as is tolerable . Avoid processed foods o These foods are generally stocked in the middle of the grocery store.  o Focus on shopping on the perimeter of the grocery.  What to Include . Vegetables o GREEN LEAFY VEGETABLES: Kale, spinach, mustard greens, collard greens, cabbage, broccoli, etc. o OTHER: Asparagus, cauliflower, eggplant, carrots, peas, Brussel sprouts, tomatoes, bell peppers, zucchini, beets, cucumbers, etc. . Grains, seeds, and legumes o Beans: kidney beans, black eyed peas, garbanzo beans, black beans, pinto beans, etc. o Whole, unrefined grains: brown rice, barley, bulgur, oatmeal, etc. . Healthy fats  o Avoid highly processed fats such as vegetable oil o Examples of healthy fats: avocado, olives, virgin olive oil, dark chocolate (?72% Cocoa), nuts (peanuts, almonds, walnuts, cashews, pecans, etc.) o Please still do small amount of these healthy fats, they are dense in  calories.  . Low - Moderate Intake of Animal Sources of Protein o Meat sources: chicken, [place], salmon, tuna. Limit to 4 ounces of meat at one time or the size of your palm. o Consider limiting dairy sources, but when choosing dairy focus on: PLAIN [place] yogurt, cottage cheese, high-protein milk . Fruit o Choose berries

## 2019-07-09 MED ORDER — OMEPRAZOLE 40 MG PO CPDR: 40.0000 mg | DELAYED_RELEASE_CAPSULE | Freq: Every day | ORAL | 6 refills | Status: DC

## 2019-07-10 ENCOUNTER — Other Ambulatory Visit: Payer: Self-pay | Admitting: Internal Medicine

## 2019-08-07 MED ORDER — PHENTERMINE HCL 37.5 MG PO TABS: 37.5000 mg | ORAL_TABLET | Freq: Every day | ORAL | 2 refills | Status: DC

## 2019-08-11 ENCOUNTER — Other Ambulatory Visit: Payer: Self-pay | Admitting: Physician Assistant

## 2019-08-22 NOTE — Progress Notes (Deleted)
Assessment and Plan:   Essential hypertension - continue medications, DASH diet, exercise and monitor at home. Call if greater than 130/80.  -     CBC with Differential/Platelet -     BASIC METABOLIC PANEL WITH GFR -     Hepatic function panel -     TSH  Fatty liver LONG discussion about cirrhosis and fatty liver -     Hepatic function panel   Type 2 diabetes mellitus with other diabetic kidney complication, without long-term current use of insulin ([place]) Discussed general issues about diabetes pathophysiology and management., Educational material distributed., Suggested low cholesterol diet., Encouraged aerobic exercise., Discussed foot care., Reminded to get yearly retinal exam. Will get eye exam Will continue farxiga Will bring food log -     Hemoglobin A1c  Class 2 severe obesity due to excess calories with serious comorbidity and body mass index (BMI) of 37.0 to 37.9 in adult [facility]) - follow up 1 months for progress monitoring - increase veggies, decrease carbs - long discussion about weight loss, diet, and exercise - bring food log  Mixed hyperlipidemia -continue medications, check lipids, decrease fatty foods, increase activity.  Not at goal of less than 70, recheck today but may need to increase dose -     Lipid panel  Medication management -     Magnesium  Continue diet and meds as discussed. Further disposition pending results of labs.  HPI 54 y.o. female  presents for 3 month follow up with hypertension, hyperlipidemia, diabetes and vitamin D.   Her blood pressure has been controlled at home, today their BP is   She does not workout. She denies chest pain, shortness of breath, dizziness.   BMI is There is no height or weight on file to calculate BMI., she is working on diet and exercise. Wt Readings from Last 3 Encounters:  07/01/19 215 lb (97.5 kg)  05/13/19 218 lb (98.9 kg)  04/03/19 210 lb (95.3 kg)    She has been working on diet and exercise for  diabetes  with CKD on ACE/ARB Hyperlipidemia NOT at goal of less than 70- on lipitor 3m and vascepa  she is not on bASA Add rybelsus last visit Farxiga Metformin  Januvia  denies foot ulcerations, hyperglycemia, hypoglycemia , increased appetite, nausea, paresthesia of the feet, polydipsia, polyuria, visual disturbances, vomiting and weight loss  Last A1C in the office was:   Lab Results  Component Value Date   HGBA1C 10.0 (H) 05/13/2019   Lab Results  Component Value Date   GFRNONAA 91 05/13/2019   Lab Results  Component Value Date   CHOL 181 05/13/2019   HDL 43 (L) 05/13/2019   LDLCALC 97 05/13/2019   TRIG 285 (H) 05/13/2019   CHOLHDL 4.2 05/13/2019    Patient is on Vitamin D supplement.  Lab Results  Component Value Date   VD25OH 54 05/13/2019       Current Medications:   Current Outpatient Medications (Endocrine & Metabolic):  .  dapagliflozin propanediol (FARXIGA) 10 MG TABS tablet, Take 1 tablet Daily for Diabetes .  metFORMIN (GLUCOPHAGE-XR) 500 MG 24 hr tablet, TAKE 2 TABLETS TWICE DAILY WITH MEALS. .Marland Kitchen Semaglutide (RYBELSUS) 7 MG TABS, Take 7 mg by mouth every morning. .  sitaGLIPtin (JANUVIA) 100 MG tablet, Take 1 tablet Daily for Diabetes  Current Outpatient Medications (Cardiovascular):  .  atorvastatin (LIPITOR) 20 MG tablet, TAKE 1 TABLET ONCE DAILY FOR CHOLESTEROL. .Marland Kitchen lisinopril (ZESTRIL) 20 MG tablet, Take 1 tablet Daily  for BP .  lisinopril-hydrochlorothiazide (ZESTORETIC) 20-12.5 MG tablet, Take 1 tablet Daily for BP & Diabetic Kidney Protection   Current Outpatient Medications (Analgesics):  .  aspirin EC 81 MG tablet, Take 81 mg by mouth daily.   Current Outpatient Medications (Other):  .  bimatoprost (LUMIGAN) 0.03 % ophthalmic solution, Place 1 drop into both eyes at bedtime. Marland Kitchen  buPROPion (WELLBUTRIN XL) 150 MG 24 hr tablet, TAKE 1 TABLET IN THE MORNING. Marland Kitchen  Cholecalciferol (VITAMIN D-3) 5000 UNITS TABS, Take by mouth daily. Marland Kitchen  glucose blood test  strip, Use as instructed .  glucose monitoring kit (FREESTYLE) monitoring kit, 1 each by Does not apply route as needed for other. .  Magnesium 500 MG CAPS, Take 1 capsule by mouth daily. Taking 932m daily .  omeprazole (PRILOSEC) 40 MG capsule, Take 1 capsule (40 mg total) by mouth daily. .  phentermine (ADIPEX-P) 37.5 MG tablet, Take 1 tablet (37.5 mg total) by mouth daily before breakfast.  Medical History:  Past Medical History:  Diagnosis Date  . Colitis 07/11/2012  . Colon polyps 07/11/2012   TUBULOVILLOUS ADENOMA  . Diabetes mellitus   . Exercise-induced asthma    outside asthma   . Hernia   . Hyperlipidemia   . Hypertension   . Iron deficiency   . Kidney stones   . Obesity   . OSA on CPAP   . Vitamin D deficiency     Allergies:  Allergies  Allergen Reactions  . Welchol [Colesevelam Hcl] Hives    HIves     Review of Systems:  Review of Systems  Constitutional: Negative for chills, fever and malaise/fatigue.  HENT: Negative for congestion, ear pain and sore throat.   Eyes: Negative.   Respiratory: Negative for cough, shortness of breath and wheezing.   Cardiovascular: Negative for chest pain, palpitations and leg swelling.  Gastrointestinal: Negative for abdominal pain, blood in stool, constipation, diarrhea, heartburn and melena.  Genitourinary: Negative.   Skin: Negative.   Neurological: Negative for dizziness, sensory change, loss of consciousness and headaches.  Psychiatric/Behavioral: Negative for depression. The patient is not nervous/anxious and does not have insomnia.     Family history- Review and unchanged  Social history- Review and unchanged  Physical Exam: There were no vitals taken for this visit. Wt Readings from Last 3 Encounters:  07/01/19 215 lb (97.5 kg)  05/13/19 218 lb (98.9 kg)  04/03/19 210 lb (95.3 kg)    General Appearance: Morbidly obese, Well nourished well developed, in no apparent distress. Eyes: PERRLA, EOMs, conjunctiva no  swelling or erythema ENT/Mouth: Ear canals normal without obstruction, swelling, erythma, discharge.  TMs normal bilaterally.  Oropharynx moist, clear, without exudate, or postoropharyngeal swelling. Neck: Supple, thyroid normal,no cervical adenopathy  Respiratory: Respiratory effort normal, Breath sounds clear A&P without rhonchi, wheeze, or rale.  No retractions, no accessory usage. Cardio: RRR with no MRGs. Brisk peripheral pulses without edema.  Abdomen: Soft, + BS,  Non tender, no guarding, rebound, hernias, masses. Musculoskeletal: Full ROM, 5/5 strength, Normal gait Skin: Warm, dry without rashes, lesions, ecchymosis.  Neuro: Awake and oriented X 3, Cranial nerves intact. Normal muscle tone, no cerebellar symptoms. Psych: Normal affect, Insight and Judgment appropriate.    AVicie Mutters PA-C 10:41 AM [facility]

## 2019-08-26 ENCOUNTER — Ambulatory Visit: Payer: Managed Care, Other (non HMO) | Admitting: Physician Assistant

## 2019-09-08 ENCOUNTER — Other Ambulatory Visit: Payer: Self-pay | Admitting: Physician Assistant

## 2019-09-09 NOTE — Progress Notes (Signed)
Assessment and Plan:   Essential hypertension - continue medications, DASH diet, exercise and monitor at home. Call if greater than 130/80.  -     CBC with Differential/Platelet -     BASIC METABOLIC PANEL WITH GFR -     Hepatic function panel -     TSH  Fatty liver LONG discussion about cirrhosis and fatty liver -     Hepatic function panel   Type 2 diabetes mellitus with other diabetic kidney complication, without long-term current use of insulin ([place]) Discussed general issues about diabetes pathophysiology and management., Educational material distributed., Suggested low cholesterol diet., Encouraged aerobic exercise., Discussed foot care., Reminded to get yearly retinal exam. Will try for freestyle libre Increase rybelsus to 14 mg daily Follow up 3 months  Class 2 severe obesity due to excess calories with serious comorbidity and body mass index (BMI) of 37.0 to 37.9 in adult [facility]) - follow up 1 months for progress monitoring - increase veggies, decrease carbs - long discussion about weight loss, diet, and exercise - bring food log  Mixed hyperlipidemia -continue medications, check lipids, decrease fatty foods, increase activity.  May need to adjust if ldl not below 70 -     Lipid panel  Medication management -     Magnesium  Continue diet and meds as discussed. Further disposition pending results of labs.  HPI 54 y.o. female  presents for 3 month follow up with hypertension, hyperlipidemia, diabetes and vitamin D.   Her blood pressure has been controlled at home, today their BP is   She does not workout. She denies chest pain, shortness of breath, dizziness.   BMI is There is no height or weight on file to calculate BMI., she is working on diet and exercise.She has had a lot of stress with her husband having had MI with 7 way bypass in June but her son was married in aprili and now is expecting a baby in Jan, will not find out sex Wt Readings from Last 3 Encounters:   07/01/19 215 lb (97.5 kg)  05/13/19 218 lb (98.9 kg)  04/03/19 210 lb (95.3 kg)    She has been working on diet and exercise for  diabetes with CKD on ACE/ARB Hyperlipidemia NOT at goal of less than 70- on lipitor '20mg'$  and vascepa  she is not on bASA She is on  rybelsus 7 mg since last visit Farxiga Metformin  Januvia  denies foot ulcerations, hyperglycemia, hypoglycemia , increased appetite, nausea, paresthesia of the feet, polydipsia, polyuria, visual disturbances, vomiting and weight loss  Last A1C in the office was:   Lab Results  Component Value Date   HGBA1C 10.0 (H) 05/13/2019   Lab Results  Component Value Date   GFRNONAA 91 05/13/2019   Lab Results  Component Value Date   CHOL 181 05/13/2019   HDL 43 (L) 05/13/2019   LDLCALC 97 05/13/2019   TRIG 285 (H) 05/13/2019   CHOLHDL 4.2 05/13/2019    Patient is on Vitamin D supplement.  Lab Results  Component Value Date   VD25OH 54 05/13/2019       Current Medications:   Current Outpatient Medications (Endocrine & Metabolic):  .  dapagliflozin propanediol (FARXIGA) 10 MG TABS tablet, Take 1 tablet Daily for Diabetes .  metFORMIN (GLUCOPHAGE-XR) 500 MG 24 hr tablet, Take 2 tablets    2 x /day with Meals for Diabetes .  Semaglutide (RYBELSUS) 7 MG TABS, Take 7 mg by mouth every morning. .  sitaGLIPtin (  JANUVIA) 100 MG tablet, Take 1 tablet Daily for Diabetes  Current Outpatient Medications (Cardiovascular):  .  atorvastatin (LIPITOR) 20 MG tablet, Take 1 tablet Daily for Cholesterol .  lisinopril (ZESTRIL) 20 MG tablet, Take 1 tablet Daily for BP .  lisinopril-hydrochlorothiazide (ZESTORETIC) 20-12.5 MG tablet, Take 1 tablet Daily for BP & Diabetic Kidney Protection   Current Outpatient Medications (Analgesics):  .  aspirin EC 81 MG tablet, Take 81 mg by mouth daily.   Current Outpatient Medications (Other):  .  bimatoprost (LUMIGAN) 0.03 % ophthalmic solution, Place 1 drop into both eyes at bedtime. Marland Kitchen   buPROPion (WELLBUTRIN XL) 150 MG 24 hr tablet, Take 1 tablet every Morning for Mood, Focus & Concentration .  Cholecalciferol (VITAMIN D-3) 5000 UNITS TABS, Take by mouth daily. Marland Kitchen  glucose blood test strip, Use as instructed .  glucose monitoring kit (FREESTYLE) monitoring kit, 1 each by Does not apply route as needed for other. .  Magnesium 500 MG CAPS, Take 1 capsule by mouth daily. Taking '950mg'$  daily .  omeprazole (PRILOSEC) 40 MG capsule, Take 1 capsule (40 mg total) by mouth daily. .  phentermine (ADIPEX-P) 37.5 MG tablet, Take 1 tablet (37.5 mg total) by mouth daily before breakfast.  Medical History:  Past Medical History:  Diagnosis Date  . Colitis 07/11/2012  . Colon polyps 07/11/2012   TUBULOVILLOUS ADENOMA  . Diabetes mellitus   . Exercise-induced asthma    outside asthma   . Hernia   . Hyperlipidemia   . Hypertension   . Iron deficiency   . Kidney stones   . Obesity   . OSA on CPAP   . Vitamin D deficiency     Allergies:  Allergies  Allergen Reactions  . Welchol [Colesevelam Hcl] Hives    HIves     Review of Systems:  Review of Systems  Constitutional: Negative for chills, fever and malaise/fatigue.  HENT: Negative for congestion, ear pain and sore throat.   Eyes: Negative.   Respiratory: Negative for cough, shortness of breath and wheezing.   Cardiovascular: Negative for chest pain, palpitations and leg swelling.  Gastrointestinal: Negative for abdominal pain, blood in stool, constipation, diarrhea, heartburn and melena.  Genitourinary: Negative.   Skin: Negative.   Neurological: Negative for dizziness, sensory change, loss of consciousness and headaches.  Psychiatric/Behavioral: Negative for depression. The patient is not nervous/anxious and does not have insomnia.     Family history- Review and unchanged  Social history- Review and unchanged  Physical Exam: There were no vitals taken for this visit. Wt Readings from Last 3 Encounters:  07/01/19 215  lb (97.5 kg)  05/13/19 218 lb (98.9 kg)  04/03/19 210 lb (95.3 kg)    General Appearance: Morbidly obese, Well nourished well developed, in no apparent distress. Eyes: PERRLA, EOMs, conjunctiva no swelling or erythema ENT/Mouth: Ear canals normal without obstruction, swelling, erythma, discharge.  TMs normal bilaterally.  Oropharynx moist, clear, without exudate, or postoropharyngeal swelling. Neck: Supple, thyroid normal,no cervical adenopathy  Respiratory: Respiratory effort normal, Breath sounds clear A&P without rhonchi, wheeze, or rale.  No retractions, no accessory usage. Cardio: RRR with no MRGs. Brisk peripheral pulses without edema.  Abdomen: Soft, + BS,  Non tender, no guarding, rebound, hernias, masses. Musculoskeletal: Full ROM, 5/5 strength, Normal gait Skin: Warm, dry without rashes, lesions, ecchymosis.  Neuro: Awake and oriented X 3, Cranial nerves intact. Normal muscle tone, no cerebellar symptoms. Psych: Normal affect, Insight and Judgment appropriate.    Vicie Mutters, PA-C 1:06  PM [facility]

## 2019-09-11 ENCOUNTER — Ambulatory Visit: Payer: Managed Care, Other (non HMO) | Admitting: Physician Assistant

## 2019-09-11 ENCOUNTER — Other Ambulatory Visit: Payer: Self-pay

## 2019-09-11 ENCOUNTER — Encounter: Payer: Self-pay | Admitting: Physician Assistant

## 2019-09-11 VITALS — BP 132/74 | HR 74 | Temp 97.5°F | Wt 213.0 lb

## 2019-09-11 DIAGNOSIS — Z79899 Other long term (current) drug therapy: Secondary | ICD-10-CM | POA: Diagnosis not present

## 2019-09-11 DIAGNOSIS — K760 Fatty (change of) liver, not elsewhere classified: Secondary | ICD-10-CM

## 2019-09-11 DIAGNOSIS — D509 Iron deficiency anemia, unspecified: Secondary | ICD-10-CM | POA: Diagnosis not present

## 2019-09-11 DIAGNOSIS — E559 Vitamin D deficiency, unspecified: Secondary | ICD-10-CM | POA: Diagnosis not present

## 2019-09-11 DIAGNOSIS — I10 Essential (primary) hypertension: Secondary | ICD-10-CM

## 2019-09-11 DIAGNOSIS — E1129 Type 2 diabetes mellitus with other diabetic kidney complication: Secondary | ICD-10-CM

## 2019-09-11 DIAGNOSIS — E1169 Type 2 diabetes mellitus with other specified complication: Secondary | ICD-10-CM

## 2019-09-11 DIAGNOSIS — G4733 Obstructive sleep apnea (adult) (pediatric): Secondary | ICD-10-CM

## 2019-09-11 DIAGNOSIS — E782 Mixed hyperlipidemia: Secondary | ICD-10-CM | POA: Diagnosis not present

## 2019-09-11 DIAGNOSIS — E785 Hyperlipidemia, unspecified: Secondary | ICD-10-CM

## 2019-09-11 MED ORDER — FREESTYLE LIBRE 14 DAY SENSOR MISC: 1.0000 "application " | 4 refills | Status: DC

## 2019-09-11 MED ORDER — RYBELSUS 14 MG PO TABS: 14.0000 mg | ORAL_TABLET | Freq: Every day | ORAL | 3 refills | Status: DC

## 2019-09-11 MED ORDER — FREESTYLE LIBRE 14 DAY READER DEVI: 1.0000 | 4 refills | Status: DC

## 2019-09-11 NOTE — Patient Instructions (Addendum)
GET SQUATTY POTTY OR SOMETHING SIMILAR GET ON MIRALAX DAILY INCREASE WATER- 4 OZ A DAY   Diabetes or even increased sugars put you at 300% increased risk of heart attack and stroke.  ALSO BEING DIABETIC YOU MAY NOT HAVE ANY PAIN WITH A HEART ATTACK.  Even worse of a chance of no pain if you are a woman.  It is very unlikely that you will have any pain with a heart attack. Likely your symptoms will be very subtle, even for very severe disease.  Your symptoms for a heart attack will likely occur when you exert your self or exercise and include: Shortness of breath Sweating Nausea Dizziness Fast or irregular heart beats Fatigue   It makes me feel better if my diabetics get their heart rate up with exercise once or twice a week and pay close attention to your body. If there is ANY change in your exercise capacity or if you have symptoms above, please STOP and call 911 or call to come to the office.   PLEASE REMEMBER:  Diabetes is preventable! Up to 10 percent of complications and morbidities among individuals with type 2 diabetes can be prevented, delayed, or effectively treated and minimized with regular visits to a health professional, appropriate monitoring and medication, and a healthy diet and lifestyle.   Here is some information to help you keep your heart healthy: Move it! - Aim for 30 mins of activity every day. Take it slowly at first. Talk to [place] before starting any new exercise program.   Lose it.  -Body Mass Index (BMI) can indicate if you need to lose weight. A healthy range is 18.5-24.9. For a BMI calculator, go to [email or web address]  Waist Management -Excess abdominal fat is a risk factor for heart disease, diabetes, asthma, stroke and more. Ideal waist circumference is less than 35" for women and less than 40" for men.   Eat Right -focus on fruits, vegetables, whole grains, and meals you make yourself. Avoid foods with trans fat and high sugar/sodium content.   Snooze or  Snore? - Loud snoring can be a sign of sleep apnea, a significant risk factor for high blood pressure, heart attach, stroke, and heart arrhythmias.  Kick the habit -Quit Smoking! Avoid second hand smoke. A single cigarette raises your blood pressure for 20 mins and increases the risk of heart attack and stroke for the next 24 hours.   Are Aspirin and Supplements right for you? -Add ENTERIC COATED low dose 81 mg Aspirin daily OR can do every other day if you have easy bruising to protect your heart and head. As well as to reduce risk of Colon Cancer by 20 %, Skin Cancer by 26 % , Melanoma by 46% and Pancreatic cancer by 60%  Say "No to Stress -There may be little you can do about problems that cause stress. However, techniques such as long walks, meditation, and exercise can help you manage it.   Start Now! - Make changes one at a time and set reasonable goals to increase your likelihood of success.     General eating tips  What to Avoid . Avoid added sugars o Often added sugar can be found in processed foods such as many condiments, dry cereals, cakes, cookies, chips, crisps, crackers, candies, sweetened drinks, etc.  o Read labels and AVOID/DECREASE use of foods with the following in their ingredient list: Sugar, fructose, high fructose corn syrup, sucrose, glucose, maltose, dextrose, molasses, cane sugar, brown sugar, any type of syrup,  agave nectar, etc.   . Avoid snacking in between meals- drink water or if you feel you need a snack, pick a high water content snack such as cucumbers, watermelon, or any veggie.  Marland Kitchen Avoid foods made with flour o If you are going to eat food made with flour, choose those made with whole-grains; and, minimize your consumption as much as is tolerable . Avoid processed foods o These foods are generally stocked in the middle of the grocery store.  o Focus on shopping on the perimeter of the grocery.  What to Include . Vegetables o GREEN LEAFY VEGETABLES:  Kale, spinach, mustard greens, collard greens, cabbage, broccoli, etc. o OTHER: Asparagus, cauliflower, eggplant, carrots, peas, Brussel sprouts, tomatoes, bell peppers, zucchini, beets, cucumbers, etc. . Grains, seeds, and legumes o Beans: kidney beans, black eyed peas, garbanzo beans, black beans, pinto beans, etc. o Whole, unrefined grains: brown rice, barley, bulgur, oatmeal, etc. . Healthy fats  o Avoid highly processed fats such as vegetable oil o Examples of healthy fats: avocado, olives, virgin olive oil, dark chocolate (?72% Cocoa), nuts (peanuts, almonds, walnuts, cashews, pecans, etc.) o Please still do small amount of these healthy fats, they are dense in calories.  . Low - Moderate Intake of Animal Sources of Protein o Meat sources: chicken, [place], salmon, tuna. Limit to 4 ounces of meat at one time or the size of your palm. o Consider limiting dairy sources, but when choosing dairy focus on: PLAIN [place] yogurt, cottage cheese, high-protein milk . Fruit o Choose berries

## 2019-09-12 LAB — COMPLETE METABOLIC PANEL WITH GFR
AG Ratio: 1.8 (calc) (ref 1.0–2.5)
ALT: 41 U/L — ABNORMAL HIGH (ref 6–29)
AST: 26 U/L (ref 10–35)
Albumin: 4.5 g/dL (ref 3.6–5.1)
Alkaline phosphatase (APISO): 59 U/L (ref 37–153)
BUN: 20 mg/dL (ref 7–25)
CO2: 29 mmol/L (ref 20–32)
Calcium: 10 mg/dL (ref 8.6–10.4)
Chloride: 102 mmol/L (ref 98–110)
Creat: 1.04 mg/dL (ref 0.50–1.05)
GFR, Est African American: 71 mL/min/{1.73_m2} (ref >=60)
GFR, Est Non African American: 61 mL/min/{1.73_m2} (ref >=60)
Globulin: 2.5 g/dL (calc) (ref 1.9–3.7)
Glucose, Bld: 126 mg/dL — ABNORMAL HIGH (ref 65–99)
Potassium: 4.4 mmol/L (ref 3.5–5.3)
Sodium: 141 mmol/L (ref 135–146)
Total Bilirubin: 0.5 mg/dL (ref 0.2–1.2)
Total Protein: 7 g/dL (ref 6.1–8.1)

## 2019-09-12 LAB — LIPID PANEL
Cholesterol: 130 mg/dL (ref <200)
HDL: 47 mg/dL — ABNORMAL LOW (ref >=50)
LDL Cholesterol (Calc): 56 mg/dL (calc)
Non-HDL Cholesterol (Calc): 83 mg/dL (calc) (ref <130)
Total CHOL/HDL Ratio: 2.8 (calc) (ref <5.0)
Triglycerides: 200 mg/dL — ABNORMAL HIGH (ref <150)

## 2019-09-12 LAB — CBC WITH DIFFERENTIAL/PLATELET
Absolute Monocytes: 321 cells/uL (ref 200–950)
Basophils Absolute: 44 cells/uL (ref 0–200)
Basophils Relative: 0.6 %
Eosinophils Absolute: 445 cells/uL (ref 15–500)
Eosinophils Relative: 6.1 %
HCT: 44.8 % (ref 35.0–45.0)
Hemoglobin: 14.5 g/dL (ref 11.7–15.5)
Lymphs Abs: 2767 cells/uL (ref 850–3900)
MCH: 28.2 pg (ref 27.0–33.0)
MCHC: 32.4 g/dL (ref 32.0–36.0)
MCV: 87.2 fL (ref 80.0–100.0)
MPV: 9.9 fL (ref 7.5–12.5)
Monocytes Relative: 4.4 %
Neutro Abs: 3723 cells/uL (ref 1500–7800)
Neutrophils Relative %: 51 %
Platelets: 327 10*3/uL (ref 140–400)
RBC: 5.14 10*6/uL — ABNORMAL HIGH (ref 3.80–5.10)
RDW: 13.1 % (ref 11.0–15.0)
Total Lymphocyte: 37.9 %
WBC: 7.3 10*3/uL (ref 3.8–10.8)

## 2019-09-12 LAB — VITAMIN D 25 HYDROXY (VIT D DEFICIENCY, FRACTURES): Vit D, 25-Hydroxy: 66 ng/mL (ref 30–100)

## 2019-09-12 LAB — FERRITIN: Ferritin: 51 ng/mL (ref 16–232)

## 2019-09-12 LAB — HEMOGLOBIN A1C
Hgb A1c MFr Bld: 7.8 % of total Hgb — ABNORMAL HIGH (ref <5.7)
Mean Plasma Glucose: 177 (calc)
eAG (mmol/L): 9.8 (calc)

## 2019-09-12 LAB — MAGNESIUM: Magnesium: 2.2 mg/dL (ref 1.5–2.5)

## 2019-09-12 LAB — IRON, TOTAL/TOTAL IRON BINDING CAP
%SAT: 16 % (calc) (ref 16–45)
Iron: 64 ug/dL (ref 45–160)
TIBC: 405 mcg/dL (calc) (ref 250–450)

## 2019-09-12 LAB — TSH: TSH: 1.34 mIU/L

## 2019-09-23 ENCOUNTER — Other Ambulatory Visit: Payer: Self-pay | Admitting: Physician Assistant

## 2019-10-02 ENCOUNTER — Other Ambulatory Visit: Payer: Self-pay | Admitting: Physician Assistant

## 2019-10-11 ENCOUNTER — Other Ambulatory Visit: Payer: Self-pay | Admitting: Internal Medicine

## 2019-11-09 ENCOUNTER — Other Ambulatory Visit: Payer: Self-pay | Admitting: Internal Medicine

## 2019-11-10 ENCOUNTER — Other Ambulatory Visit: Payer: Self-pay | Admitting: Physician Assistant

## 2019-12-09 ENCOUNTER — Other Ambulatory Visit: Payer: Self-pay | Admitting: Internal Medicine

## 2019-12-11 NOTE — Progress Notes (Signed)
Assessment and Plan:  Salenditits? Versus lymph- has improved Check sjogren's Do sour candy, heating pad, massage - go to ER if any trouble swallowing, unable to eat, SOB.   Essential hypertension - continue medications, DASH diet, exercise and monitor at home. Call if greater than 130/80.  -     CBC with Differential/Platelet -     BASIC METABOLIC PANEL WITH GFR -     Hepatic function panel -     TSH  Fatty liver LONG discussion about cirrhosis and fatty liver -     Hepatic function panel   Type 2 diabetes mellitus with other diabetic kidney complication, without long-term current use of insulin ([facility]) Discussed general issues about diabetes pathophysiology and management., Educational material distributed., Suggested low cholesterol diet., Encouraged aerobic exercise., Discussed foot care., Reminded to get yearly retinal exam.        freestyle libre Continue rybelsus to 14 mg daily Follow up 3 months  Class 2 severe obesity due to excess calories with serious comorbidity and body mass index (BMI) of 37.0 to 37.9 in adult [facility]) - follow up 3 months for progress monitoring - increase veggies, decrease carbs - long discussion about weight loss, diet, and exercise - bring food log- continue weight loss- goal is 175  Mixed hyperlipidemia -continue medications, check lipids, decrease fatty foods, increase activity.  -     Lipid panel  Medication management -     Magnesium  Continue diet and meds as discussed. Further disposition pending results of labs.  HPI 54 y.o. female  presents for 3 month follow up with hypertension, hyperlipidemia, diabetes and vitamin D.  She contacted the office 12/06/2019    Her blood pressure has been controlled at home, today their BP is BP: 126/70 She does not workout. She denies chest pain, shortness of breath, dizziness.   BMI is Body mass index is 35.53 kg/m., she is working on diet and exercise.She has had a lot of stress with her husband  having had MI with 7 way bypass in June but her son was married in april and now is expecting a baby in Jan, will not find out sex Wt Readings from Last 3 Encounters:  12/12/19 207 lb (93.9 kg)  09/11/19 213 lb (96.6 kg)  07/01/19 215 lb (97.5 kg)   She has been working on diet and exercise for  diabetes with CKD on ACE/ARB Hyperlipidemia NOT at goal of less than 70- on lipitor 45m and vascepa  she is not on bASA She is on  rybelsus 14 mg since last visit Farxiga Metformin  Januvia  denies foot ulcerations, hyperglycemia, hypoglycemia , increased appetite, nausea, paresthesia of the feet, polydipsia, polyuria, visual disturbances, vomiting and weight loss  Last A1C in the office was:   Lab Results  Component Value Date   HGBA1C 7.8 (H) 09/11/2019   Lab Results  Component Value Date   GFRNONAA 61 09/11/2019   Lab Results  Component Value Date   CHOL 130 09/11/2019   HDL 47 (L) 09/11/2019   LDLCALC 56 09/11/2019   TRIG 200 (H) 09/11/2019   CHOLHDL 2.8 09/11/2019    Patient is on Vitamin D supplement.  Lab Results  Component Value Date   VD25OH 66 09/11/2019       Current Medications:   Current Outpatient Medications (Endocrine & Metabolic):  .  dapagliflozin propanediol (FARXIGA) 10 MG TABS tablet, Take 1 tablet Daily for Diabetes .  metFORMIN (GLUCOPHAGE-XR) 500 MG 24 hr tablet, Take  2 tablets     2 x /day      with Meals for Diabetes .  Semaglutide (RYBELSUS) 14 MG TABS, Take 14 mg by mouth daily.  Current Outpatient Medications (Cardiovascular):  .  atorvastatin (LIPITOR) 20 MG tablet, Take    1 tablet     Daily     for Cholesterol .  icosapent Ethyl (VASCEPA) 1 g capsule, TAKE (2) CAPSULES BY MOUTH TWICE DAILY. Marland Kitchen  lisinopril (ZESTRIL) 20 MG tablet, Take 1 tablet Daily for BP .  lisinopril-hydrochlorothiazide (ZESTORETIC) 20-12.5 MG tablet, Take 1 tablet    Daily    for BP & Diabetic Kidney Protection   Current Outpatient Medications (Analgesics):  .   aspirin EC 81 MG tablet, Take 81 mg by mouth daily.   Current Outpatient Medications (Other):  .  bimatoprost (LUMIGAN) 0.03 % ophthalmic solution, Place 1 drop into both eyes at bedtime. Marland Kitchen  buPROPion (WELLBUTRIN XL) 150 MG 24 hr tablet, Take    1 tablet     Daily     for Mood .  Cholecalciferol (VITAMIN D-3) 5000 UNITS TABS, Take by mouth daily. .  Continuous Blood Gluc Receiver (FREESTYLE LIBRE 14 DAY READER) DEVI, 1 Device by Does not apply route every 14 (fourteen) days. .  Continuous Blood Gluc Sensor (FREESTYLE LIBRE 14 DAY SENSOR) MISC, Apply 1 application topically every 14 (fourteen) days. Marland Kitchen  glucose blood test strip, Use as instructed .  glucose monitoring kit (FREESTYLE) monitoring kit, 1 each by Does not apply route as needed for other. .  Magnesium 500 MG CAPS, Take 1 capsule by mouth daily. Taking 961m daily .  omeprazole (PRILOSEC) 40 MG capsule, Take 1 capsule (40 mg total) by mouth daily.  Medical History:  Past Medical History:  Diagnosis Date  . Colitis 07/11/2012  . Colon polyps 07/11/2012   TUBULOVILLOUS ADENOMA  . Diabetes mellitus   . Exercise-induced asthma    outside asthma   . Hernia   . Hyperlipidemia   . Hypertension   . Iron deficiency   . Kidney stones   . Obesity   . OSA on CPAP   . Vitamin D deficiency     Allergies:  Allergies  Allergen Reactions  . Welchol [Colesevelam Hcl] Hives    HIves     Review of Systems:  Review of Systems  Constitutional: Negative for chills, fever and malaise/fatigue.  HENT: Negative for congestion, ear pain and sore throat.   Eyes: Negative.   Respiratory: Negative for cough, shortness of breath and wheezing.   Cardiovascular: Negative for chest pain, palpitations and leg swelling.  Gastrointestinal: Negative for abdominal pain, blood in stool, constipation, diarrhea, heartburn and melena.  Genitourinary: Negative.   Skin: Negative.   Neurological: Negative for dizziness, sensory change, loss of  consciousness and headaches.  Psychiatric/Behavioral: Negative for depression. The patient is not nervous/anxious and does not have insomnia.     Family history- Review and unchanged  Social history- Review and unchanged  Physical Exam: BP 126/70   Pulse 66   Temp 97.7 F (36.5 C)   Wt 207 lb (93.9 kg)   SpO2 96%   BMI 35.53 kg/m  Wt Readings from Last 3 Encounters:  12/12/19 207 lb (93.9 kg)  09/11/19 213 lb (96.6 kg)  07/01/19 215 lb (97.5 kg)    General Appearance: Morbidly obese, Well nourished well developed, in no apparent distress. Eyes: PERRLA, EOMs, conjunctiva no swelling or erythema ENT/Mouth: Ear canals normal without obstruction, swelling, erythma,  discharge.  TMs normal bilaterally.  Oropharynx moist, clear, without exudate, or postoropharyngeal swelling. Neck: Supple, thyroid normal,no cervical adenopathy  Respiratory: Respiratory effort normal, Breath sounds clear A&P without rhonchi, wheeze, or rale.  No retractions, no accessory usage. Cardio: RRR with no MRGs. Brisk peripheral pulses without edema.  Abdomen: Soft, + BS,  Non tender, no guarding, rebound, hernias, masses. Musculoskeletal: Full ROM, 5/5 strength, Normal gait Skin: Warm, dry without rashes, lesions, ecchymosis.  Neuro: Awake and oriented X 3, Cranial nerves intact. Normal muscle tone, no cerebellar symptoms. Psych: Normal affect, Insight and Judgment appropriate.    Vicie Mutters, PA-C 10:52 AM [facility]

## 2019-12-12 ENCOUNTER — Ambulatory Visit: Payer: Managed Care, Other (non HMO) | Admitting: Physician Assistant

## 2019-12-12 ENCOUNTER — Other Ambulatory Visit: Payer: Self-pay

## 2019-12-12 ENCOUNTER — Encounter: Payer: Self-pay | Admitting: Physician Assistant

## 2019-12-12 VITALS — BP 126/70 | HR 66 | Temp 97.7°F | Wt 207.0 lb

## 2019-12-12 DIAGNOSIS — I10 Essential (primary) hypertension: Secondary | ICD-10-CM

## 2019-12-12 DIAGNOSIS — E782 Mixed hyperlipidemia: Secondary | ICD-10-CM

## 2019-12-12 DIAGNOSIS — E1129 Type 2 diabetes mellitus with other diabetic kidney complication: Secondary | ICD-10-CM

## 2019-12-12 DIAGNOSIS — Z79899 Other long term (current) drug therapy: Secondary | ICD-10-CM

## 2019-12-12 DIAGNOSIS — E1169 Type 2 diabetes mellitus with other specified complication: Secondary | ICD-10-CM

## 2019-12-12 DIAGNOSIS — R682 Dry mouth, unspecified: Secondary | ICD-10-CM

## 2019-12-12 DIAGNOSIS — E785 Hyperlipidemia, unspecified: Secondary | ICD-10-CM

## 2019-12-12 DIAGNOSIS — E559 Vitamin D deficiency, unspecified: Secondary | ICD-10-CM

## 2019-12-12 NOTE — Patient Instructions (Signed)
Do sugar free sour candies Do heating pad for at least 20 mins 2-3 x a day Drink lots of water if you are able  If not better will do ultrasound More of a salenditis   Parotitis   Parotitis is inflammation of one or both of your parotid glands. These glands produce saliva. They are found on each side of your face, below and in front of your earlobes. The saliva that they produce comes out of tiny openings (ducts) inside your cheeks. Parotitis may cause sudden swelling and pain (acute parotitis). It can also cause repeated episodes of swelling and pain or continued swelling that may or may not be painful (chronic parotitis). What are the causes? This condition may be caused by:  Infections from bacteria.  Infections from viruses, such as mumps or HIV.  Blockage (obstruction) of saliva flow through the parotid glands. This can be from a stone, scar tissue, or a tumor.  Diseases that cause your body's defense system (immune system) to attack healthy cells in your salivary glands. These are called autoimmune diseases. What increases the risk? You are more likely to develop this condition if:  You are 44 years old or older.  You do not drink enough fluids (are dehydrated).  You drink too much alcohol.  You have: ? A dry mouth. ? Poor dental hygiene. ? Diabetes. ? Gout. ? A long-term illness.  You have had radiation treatments to the head and neck.  You take certain medicines. What are the signs or symptoms? Symptoms of this condition depend on the cause. Symptoms may include:  Swelling under and in front of the ear. This may get worse after eating.  Redness of the skin over the parotid gland.  Pain and tenderness over the parotid gland. This may get worse after eating.  Fever or chills.  Pus coming from the ducts inside the mouth.  Dry mouth.  A bad taste in the mouth. How is this diagnosed? This condition may be diagnosed based on:  Your medical  history.  A physical exam.  Tests to find the cause of the parotitis. These may include: ? Doing blood tests to check for an autoimmune disease or infections from a virus. ? Taking a fluid sample from the parotid gland and testing it for infection. ? Injecting the ducts of the parotid gland with a dye and then taking X-rays (sialogram). ? Having other imaging tests of the gland, such as X-rays, ultrasound, MRI, or CT scan. ? Checking the opening of the gland for a stone or obstruction. ? Placing a needle into the gland to remove tissue for a biopsy (fine needle aspiration). How is this treated? Treatment for this condition depends on the cause. Treatment may include:  Antibiotic medicine for a bacterial infection.  Drinking more fluids.  Removing a stone or obstruction.  Treating an underlying disease that is causing parotitis.  Surgery to drain an infection, remove a growth, or remove the whole gland (parotidectomy). Treatment may not be needed if parotid swelling goes away with home care. Follow these instructions at home: Medicines    Take over-the-counter and prescription medicines only as told by your health care provider.  If you were prescribed an antibiotic medicine, take it as told by your health care provider. Do not stop taking the antibiotic even if you start to feel better. Managing pain and swelling  If directed, apply heat to the affected area as often as told by your health care provider. Use the  heat source that your health care provider recommends, such as a moist heat pack or a heating pad. To apply the heat: ? Place a towel between your skin and the heat source. ? Leave the heat on for 20-30 minutes. ? Remove the heat if your skin turns bright red. This is especially important if you are unable to feel pain, heat, or cold. You may have a greater risk of getting burned.  Gargle with a salt-water mixture 3-4 times a day or as needed. To make a salt-water  mixture, completely dissolve -1 tsp (3-6 g) of salt in 1 cup (237 mL) of warm water.  Gently massage the parotid glands as told by your health care provider. General instructions    Drink enough fluid to keep your urine pale yellow.  Keep your mouth clean and moist.  Try sucking on sour candy. This may help to make your mouth less dry by stimulating the flow of saliva.  Maintain good oral health. ? Brush your teeth at least two times a day. ? Floss your teeth every day. ? See your dentist regularly.  Do not use any products that contain nicotine or tobacco, such as cigarettes, e-cigarettes, and chewing tobacco. If you need help quitting, ask your health care provider.  Do not drink alcohol.  Keep all follow-up visits as told by your health care provider. This is important. Contact a health care provider if:  You have a fever or chills.  You have new symptoms.  Your symptoms get worse.  Your symptoms do not improve with treatment. Get help right away if:  You have difficulty breathing or swallowing because of the swollen gland. Summary  Parotitis is inflammation of one or both of your parotid glands.  Symptoms include pain and swelling under and in front of the ear. They may also include a fever and a bad taste in your mouth.  This condition may be treated with antibiotics, increasing fluids, or surgery.  In some cases, parotitis may go away on its own without treatment.  You should drink plenty of fluids, maintain good oral hygiene, and avoid tobacco products. This information is not intended to replace advice given to you by your health care provider. Make sure you discuss any questions you have with your health care provider. Document Released: 08/27/2001 Document Revised: 10/03/2017 Document Reviewed: 10/03/2017 Elsevier Interactive Patient Education  2019 [employer].

## 2019-12-13 LAB — CBC WITH DIFFERENTIAL/PLATELET
Absolute Monocytes: 343 cells/uL (ref 200–950)
Basophils Absolute: 49 cells/uL (ref 0–200)
Basophils Relative: 0.7 %
Eosinophils Absolute: 406 cells/uL (ref 15–500)
Eosinophils Relative: 5.8 %
HCT: 46.8 % — ABNORMAL HIGH (ref 35.0–45.0)
Hemoglobin: 15.2 g/dL (ref 11.7–15.5)
Lymphs Abs: 3234 cells/uL (ref 850–3900)
MCH: 27.7 pg (ref 27.0–33.0)
MCHC: 32.5 g/dL (ref 32.0–36.0)
MCV: 85.4 fL (ref 80.0–100.0)
MPV: 9.8 fL (ref 7.5–12.5)
Monocytes Relative: 4.9 %
Neutro Abs: 2968 cells/uL (ref 1500–7800)
Neutrophils Relative %: 42.4 %
Platelets: 348 10*3/uL (ref 140–400)
RBC: 5.48 10*6/uL — ABNORMAL HIGH (ref 3.80–5.10)
RDW: 13.9 % (ref 11.0–15.0)
Total Lymphocyte: 46.2 %
WBC: 7 10*3/uL (ref 3.8–10.8)

## 2019-12-13 LAB — COMPLETE METABOLIC PANEL WITH GFR
AG Ratio: 1.7 (calc) (ref 1.0–2.5)
ALT: 45 U/L — ABNORMAL HIGH (ref 6–29)
AST: 30 U/L (ref 10–35)
Albumin: 4.5 g/dL (ref 3.6–5.1)
Alkaline phosphatase (APISO): 58 U/L (ref 37–153)
BUN: 20 mg/dL (ref 7–25)
CO2: 30 mmol/L (ref 20–32)
Calcium: 10.4 mg/dL (ref 8.6–10.4)
Chloride: 101 mmol/L (ref 98–110)
Creat: 0.93 mg/dL (ref 0.50–1.05)
GFR, Est African American: 81 mL/min/{1.73_m2} (ref >=60)
GFR, Est Non African American: 70 mL/min/{1.73_m2} (ref >=60)
Globulin: 2.6 g/dL (calc) (ref 1.9–3.7)
Glucose, Bld: 101 mg/dL — ABNORMAL HIGH (ref 65–99)
Potassium: 4.3 mmol/L (ref 3.5–5.3)
Sodium: 141 mmol/L (ref 135–146)
Total Bilirubin: 0.9 mg/dL (ref 0.2–1.2)
Total Protein: 7.1 g/dL (ref 6.1–8.1)

## 2019-12-13 LAB — TSH: TSH: 0.98 mIU/L

## 2019-12-13 LAB — LIPID PANEL
Cholesterol: 132 mg/dL (ref <200)
HDL: 40 mg/dL — ABNORMAL LOW (ref >=50)
LDL Cholesterol (Calc): 70 mg/dL (calc)
Non-HDL Cholesterol (Calc): 92 mg/dL (calc) (ref <130)
Total CHOL/HDL Ratio: 3.3 (calc) (ref <5.0)
Triglycerides: 137 mg/dL (ref <150)

## 2019-12-13 LAB — SJOGRENS SYNDROME-A EXTRACTABLE NUCLEAR ANTIBODY: SSA (Ro) (ENA) Antibody, IgG: <1 AI

## 2019-12-13 LAB — HEMOGLOBIN A1C
Hgb A1c MFr Bld: 7.5 % of total Hgb — ABNORMAL HIGH (ref <5.7)
Mean Plasma Glucose: 169 (calc)
eAG (mmol/L): 9.3 (calc)

## 2019-12-13 LAB — SJOGRENS SYNDROME-B EXTRACTABLE NUCLEAR ANTIBODY: SSB (La) (ENA) Antibody, IgG: <1 AI

## 2019-12-31 ENCOUNTER — Other Ambulatory Visit: Payer: Self-pay | Admitting: Internal Medicine

## 2020-01-07 ENCOUNTER — Other Ambulatory Visit: Payer: Self-pay | Admitting: Internal Medicine

## 2020-01-11 ENCOUNTER — Other Ambulatory Visit: Payer: Self-pay | Admitting: Adult Health

## 2020-01-12 ENCOUNTER — Other Ambulatory Visit: Payer: Self-pay | Admitting: Internal Medicine

## 2020-01-13 ENCOUNTER — Other Ambulatory Visit: Payer: Self-pay | Admitting: Internal Medicine

## 2020-01-13 DIAGNOSIS — E1169 Type 2 diabetes mellitus with other specified complication: Secondary | ICD-10-CM

## 2020-01-13 DIAGNOSIS — E1129 Type 2 diabetes mellitus with other diabetic kidney complication: Secondary | ICD-10-CM

## 2020-01-13 MED ORDER — RYBELSUS 14 MG PO TABS: ORAL_TABLET | ORAL | 0 refills | Status: DC

## 2020-01-14 ENCOUNTER — Encounter: Payer: Managed Care, Other (non HMO) | Admitting: Physician Assistant

## 2020-02-10 ENCOUNTER — Other Ambulatory Visit: Payer: Self-pay | Admitting: Internal Medicine

## 2020-02-10 DIAGNOSIS — K219 Gastro-esophageal reflux disease without esophagitis: Secondary | ICD-10-CM

## 2020-02-10 MED ORDER — OMEPRAZOLE 40 MG PO CPDR: DELAYED_RELEASE_CAPSULE | ORAL | 0 refills | Status: DC

## 2020-02-17 ENCOUNTER — Other Ambulatory Visit: Payer: Self-pay | Admitting: Internal Medicine

## 2020-03-10 ENCOUNTER — Other Ambulatory Visit: Payer: Self-pay | Admitting: Adult Health Nurse Practitioner

## 2020-03-10 ENCOUNTER — Other Ambulatory Visit: Payer: Self-pay | Admitting: Internal Medicine

## 2020-03-12 ENCOUNTER — Other Ambulatory Visit: Payer: Self-pay | Admitting: Internal Medicine

## 2020-03-15 NOTE — Progress Notes (Signed)
FOLLOW UP 3 MONTH  Assessment and Plan:    Essential hypertension Continue current medications: lisinopril 35m and lisinopril 20/HCTZ12.525mdaily Monitor blood pressure at home; call if consistently over 130/80 Continue DASH diet.   Reminder to go to the ER if any CP, SOB, nausea, dizziness, severe HA, changes vision/speech, left arm numbness and tingling and jaw pain. -     CBC with Differential/Platelet -     BASIC METABOLIC PANEL WITH GFR -     Hepatic function panel -     TSH  Mixed hyperlipidemia Continue medications: Atorvastatin 2058maily Discussed dietary and exercise modifications Low fat diet -     Lipid panel  Fatty liver LONG discussion about cirrhosis and fatty liver -     Hepatic function panel   Type 2 diabetes mellitus with other diabetic kidney complication, without long-term current use of insulin ([place] general issues about diabetes pathophysiology and management., Educational material distributed., Suggested low cholesterol diet., Encouraged aerobic exercise., Discussed foot care., Reminded to get yearly retinal exam.        freestyle libre Continue rybelsus to 14 mg daily, farxiga 21m5metformin 500mg4m tablets BID. Follow up 3 months  Class 2 severe obesity due to excess calories with serious comorbidity and body mass index (BMI) of 37.0 to 37.9 in adult (HCC)[facility] - increase veggies, decrease carbs - long discussion about weight loss, diet, and exercise - bring food log- continue weight loss- goal is 175  Gastroesophageal reflux disease without esophagitis Doing well at this time Continue: omprazole 40mg 2my Diet discussed Monitor for triggers Avoid food with high acid content Avoid excessive cafeine Increase water intake  Vitamin D deficiency Continue supplementation to maintain goal of 70-100 Taking Vitamin D 5,000IU daily Defervitamin D level  Morbid obesity (HCC) Discussed dietary  and exercise modifications  Iron deficiency anemia, unspecified iron deficiency anemia type      No supplementation  OBSTRUCTIVE SLEEP APNEA Continue CPAP/BiPAP, using nightly for at least 8 hours  Helping with daytime fatigue Weight loss still advised Discussed mask & tubing hygeine  Medication management Continued   Further disposition pending results if labs check today. Discussed med's effects and SE's.   Over 30 minutes of face to face interview, exam, counseling, chart review, and critical decision making was performed.    HPI 54 [id number].o27female  presents for 3 month follow up with hypertension, hyperlipidemia, diabetes and vitamin D.  She was last seen on 12/12/19 for three month follow up.  Reports she is getting over a sinus infection.  Reports that she still has sinus drainage at times.  She eneed up calling telehealth place as it would not go away with symptoms management.  She complete a course of amoxicillin and is feeling much better.    Her blood pressure has been controlled at home, today their BP is BP: 124/88 She does not workout. She denies chest pain, shortness of breath, dizziness.   BMI is Body mass index is 34.67 kg/m., she is working on diet and exercise.She has had a lot of stress with her husband having had MI with 7 way bypass in June but her son was married in april and now is expecting a baby in Jan, will not find out sex Wt Readings from Last 3 Encounters:  03/16/20 202 lb (91.6 kg)  12/12/19 207 lb (93.9 kg)  09/11/19 213 lb (96.6 kg)   She has been working on diet  and exercise for  diabetes with CKD on ACE/ARB Hyperlipidemia NOT at goal of less than 70- on lipitor 42m and vascepa  she is not on bASA She is on  rybelsus 14 mg since last visit Farxiga Metformin  Januvia  denies foot ulcerations, hyperglycemia, hypoglycemia , increased appetite, nausea, paresthesia of the feet, polydipsia, polyuria, visual disturbances, vomiting and weight loss  Last  A1C in the office was:   Lab Results  Component Value Date   HGBA1C 7.5 (H) 12/12/2019   Lab Results  Component Value Date   GFRNONAA 70 12/12/2019   Lab Results  Component Value Date   CHOL 132 12/12/2019   HDL 40 (L) 12/12/2019   LDLCALC 70 12/12/2019   TRIG 137 12/12/2019   CHOLHDL 3.3 12/12/2019    Patient is on Vitamin D supplement.  Lab Results  Component Value Date   VD25OH 66 09/11/2019       Current Medications:   Current Outpatient Medications (Endocrine & Metabolic):  .  dapagliflozin propanediol (FARXIGA) 10 MG TABS tablet, Take     1 tablet     Daily       for Diabetes .  metFORMIN (GLUCOPHAGE-XR) 500 MG 24 hr tablet, TAKE 2 TABLETS TWICE DAILY WITH MEALS. .Marland Kitchen Semaglutide (RYBELSUS) 14 MG TABS, Take     1 tablet     Daily       for Diabetes  Current Outpatient Medications (Cardiovascular):  .  atorvastatin (LIPITOR) 20 MG tablet, Take     1 tablet     Daily    for Cholesterol .  icosapent Ethyl (VASCEPA) 1 g capsule, Take     2 capsules      2 x /day      for Triglycerides (Blood Fats) .  lisinopril (ZESTRIL) 20 MG tablet, Take      1 tablet     Daily      for BP .  lisinopril-hydrochlorothiazide (ZESTORETIC) 20-12.5 MG tablet, Take 1 tablet    Daily    for BP & Diabetic Kidney Protection   Current Outpatient Medications (Analgesics):  .  aspirin EC 81 MG tablet, Take 81 mg by mouth daily.   Current Outpatient Medications (Other):  .  bimatoprost (LUMIGAN) 0.03 % ophthalmic solution, Place 1 drop into both eyes at bedtime. .Marland Kitchen buPROPion (WELLBUTRIN XL) 150 MG 24 hr tablet, Take 1 tablet every Morning for Mood, Focus & Concentration .  Cholecalciferol (VITAMIN D-3) 5000 UNITS TABS, Take by mouth daily. .  Continuous Blood Gluc Receiver (FREESTYLE LIBRE 14 DAY READER) DEVI, 1 Device by Does not apply route every 14 (fourteen) days. .  Continuous Blood Gluc Sensor (FREESTYLE LIBRE 14 DAY SENSOR) MISC, Apply 1 application topically every 14 (fourteen) days. .Marland Kitchen  glucose blood test strip, Use as instructed .  glucose monitoring kit (FREESTYLE) monitoring kit, 1 each by Does not apply route as needed for other. .  Magnesium 500 MG CAPS, Take 1 capsule by mouth daily. Taking 9541mdaily .  omeprazole (PRILOSEC) 40 MG capsule, Take      1 capsule      Daily        to Prevent Indigestion & Heartburn  Medical History:  Past Medical History:  Diagnosis Date  . Colitis 07/11/2012  . Colon polyps 07/11/2012   TUBULOVILLOUS ADENOMA  . Diabetes mellitus   . Exercise-induced asthma    outside asthma   . Hernia   . Hyperlipidemia   . Hypertension   .  Iron deficiency   . Kidney stones   . Obesity   . OSA on CPAP   . Vitamin D deficiency     Allergies:  Allergies  Allergen Reactions  . Welchol [Colesevelam Hcl] Hives    HIves     Review of Systems:  Review of Systems  Constitutional: Negative for chills, fever and malaise/fatigue.  HENT: Negative for congestion, ear pain and sore throat.   Eyes: Negative.   Respiratory: Negative for cough, shortness of breath and wheezing.   Cardiovascular: Negative for chest pain, palpitations and leg swelling.  Gastrointestinal: Negative for abdominal pain, blood in stool, constipation, diarrhea, heartburn and melena.  Genitourinary: Negative.   Skin: Negative.   Neurological: Negative for dizziness, sensory change, loss of consciousness and headaches.  Psychiatric/Behavioral: Negative for depression. The patient is not nervous/anxious and does not have insomnia.     Family history- Review and unchanged  Social history- Review and unchanged  Physical Exam: BP 124/88   Pulse 87   Temp (!) 97.5 F (36.4 C)   Wt 202 lb (91.6 kg)   SpO2 97%   BMI 34.67 kg/m  Wt Readings from Last 3 Encounters:  03/16/20 202 lb (91.6 kg)  12/12/19 207 lb (93.9 kg)  09/11/19 213 lb (96.6 kg)    General Appearance: Morbidly obese, Well nourished well developed, in no apparent distress. Eyes: PERRLA, EOMs,  conjunctiva no swelling or erythema ENT/Mouth: Ear canals normal without obstruction, swelling, erythma, discharge.  TMs normal bilaterally.  Oropharynx moist, clear, without exudate, or postoropharyngeal swelling. Neck: Supple, thyroid normal,no cervical adenopathy  Respiratory: Respiratory effort normal, Breath sounds clear A&P without rhonchi, wheeze, or rale.  No retractions, no accessory usage. Cardio: RRR with no MRGs. Brisk peripheral pulses without edema.  Abdomen: Soft, + BS,  Non tender, no guarding, rebound, hernias, masses. Musculoskeletal: Full ROM, 5/5 strength, Normal gait Skin: Warm, dry without rashes, lesions, ecchymosis.  Neuro: Awake and oriented X 3, Cranial nerves intact. Normal muscle tone, no cerebellar symptoms. Psych: Normal affect, Insight and Judgment appropriate.     Garnet Sierras, Laqueta Jean, DNP [facility] 03/16/2020  10:41 AM

## 2020-03-16 ENCOUNTER — Ambulatory Visit: Payer: Managed Care, Other (non HMO) | Admitting: Adult Health Nurse Practitioner

## 2020-03-16 ENCOUNTER — Encounter: Payer: Self-pay | Admitting: Adult Health Nurse Practitioner

## 2020-03-16 ENCOUNTER — Other Ambulatory Visit: Payer: Self-pay

## 2020-03-16 VITALS — BP 124/88 | HR 87 | Temp 97.5°F | Wt 202.0 lb

## 2020-03-16 DIAGNOSIS — K760 Fatty (change of) liver, not elsewhere classified: Secondary | ICD-10-CM

## 2020-03-16 DIAGNOSIS — G4733 Obstructive sleep apnea (adult) (pediatric): Secondary | ICD-10-CM

## 2020-03-16 DIAGNOSIS — E782 Mixed hyperlipidemia: Secondary | ICD-10-CM

## 2020-03-16 DIAGNOSIS — Z79899 Other long term (current) drug therapy: Secondary | ICD-10-CM

## 2020-03-16 DIAGNOSIS — I10 Essential (primary) hypertension: Secondary | ICD-10-CM

## 2020-03-16 DIAGNOSIS — E1129 Type 2 diabetes mellitus with other diabetic kidney complication: Secondary | ICD-10-CM

## 2020-03-16 DIAGNOSIS — D509 Iron deficiency anemia, unspecified: Secondary | ICD-10-CM

## 2020-03-16 DIAGNOSIS — E559 Vitamin D deficiency, unspecified: Secondary | ICD-10-CM

## 2020-03-16 DIAGNOSIS — K219 Gastro-esophageal reflux disease without esophagitis: Secondary | ICD-10-CM

## 2020-03-16 NOTE — Patient Instructions (Signed)
° ° °  You can try switching from Cetirizine (Zyrtec)  To Levocetirazine (Xyzal)  every day.  This can make you sleepy so know how this affects you before you drive.   Fexofenadine (Allegra) you can take once a day and can be taken with one of the agents above.  Take one in the day and one a night to help with allergy symptoms.

## 2020-03-17 LAB — CBC WITH DIFFERENTIAL/PLATELET
Absolute Monocytes: 302 cells/uL (ref 200–950)
Basophils Absolute: 40 cells/uL (ref 0–200)
Basophils Relative: 0.6 %
Eosinophils Absolute: 181 cells/uL (ref 15–500)
Eosinophils Relative: 2.7 %
HCT: 44.2 % (ref 35.0–45.0)
Hemoglobin: 15.1 g/dL (ref 11.7–15.5)
Lymphs Abs: 2539 cells/uL (ref 850–3900)
MCH: 29 pg (ref 27.0–33.0)
MCHC: 34.2 g/dL (ref 32.0–36.0)
MCV: 85 fL (ref 80.0–100.0)
MPV: 9.5 fL (ref 7.5–12.5)
Monocytes Relative: 4.5 %
Neutro Abs: 3638 cells/uL (ref 1500–7800)
Neutrophils Relative %: 54.3 %
Platelets: 325 10*3/uL (ref 140–400)
RBC: 5.2 10*6/uL — ABNORMAL HIGH (ref 3.80–5.10)
RDW: 13 % (ref 11.0–15.0)
Total Lymphocyte: 37.9 %
WBC: 6.7 10*3/uL (ref 3.8–10.8)

## 2020-03-17 LAB — LIPID PANEL
Cholesterol: 138 mg/dL (ref <200)
HDL: 44 mg/dL — ABNORMAL LOW (ref >=50)
LDL Cholesterol (Calc): 69 mg/dL (calc)
Non-HDL Cholesterol (Calc): 94 mg/dL (calc) (ref <130)
Total CHOL/HDL Ratio: 3.1 (calc) (ref <5.0)
Triglycerides: 170 mg/dL — ABNORMAL HIGH (ref <150)

## 2020-03-17 LAB — COMPLETE METABOLIC PANEL WITH GFR
AG Ratio: 2.3 (calc) (ref 1.0–2.5)
ALT: 35 U/L — ABNORMAL HIGH (ref 6–29)
AST: 22 U/L (ref 10–35)
Albumin: 4.9 g/dL (ref 3.6–5.1)
Alkaline phosphatase (APISO): 59 U/L (ref 37–153)
BUN: 16 mg/dL (ref 7–25)
CO2: 28 mmol/L (ref 20–32)
Calcium: 10.1 mg/dL (ref 8.6–10.4)
Chloride: 100 mmol/L (ref 98–110)
Creat: 0.92 mg/dL (ref 0.50–1.05)
GFR, Est African American: 82 mL/min/{1.73_m2} (ref >=60)
GFR, Est Non African American: 71 mL/min/{1.73_m2} (ref >=60)
Globulin: 2.1 g/dL (calc) (ref 1.9–3.7)
Glucose, Bld: 136 mg/dL — ABNORMAL HIGH (ref 65–99)
Potassium: 4.2 mmol/L (ref 3.5–5.3)
Sodium: 139 mmol/L (ref 135–146)
Total Bilirubin: 0.6 mg/dL (ref 0.2–1.2)
Total Protein: 7 g/dL (ref 6.1–8.1)

## 2020-03-17 LAB — HEMOGLOBIN A1C
Hgb A1c MFr Bld: 6.8 % of total Hgb — ABNORMAL HIGH (ref <5.7)
Mean Plasma Glucose: 148 mg/dL
eAG (mmol/L): 8.2 mmol/L

## 2020-04-03 ENCOUNTER — Other Ambulatory Visit: Payer: Self-pay | Admitting: Internal Medicine

## 2020-04-03 ENCOUNTER — Other Ambulatory Visit: Payer: Self-pay | Admitting: Adult Health

## 2020-04-03 MED ORDER — HYDROCHLOROTHIAZIDE 12.5 MG PO TABS: 12.5000 mg | ORAL_TABLET | Freq: Every day | ORAL | 0 refills | Status: DC

## 2020-04-03 MED ORDER — LISINOPRIL 40 MG PO TABS: ORAL_TABLET | ORAL | 0 refills | Status: DC

## 2020-04-09 ENCOUNTER — Other Ambulatory Visit: Payer: Self-pay | Admitting: Internal Medicine

## 2020-04-09 DIAGNOSIS — E1169 Type 2 diabetes mellitus with other specified complication: Secondary | ICD-10-CM

## 2020-04-09 DIAGNOSIS — E785 Hyperlipidemia, unspecified: Secondary | ICD-10-CM

## 2020-04-09 DIAGNOSIS — E1129 Type 2 diabetes mellitus with other diabetic kidney complication: Secondary | ICD-10-CM

## 2020-04-14 ENCOUNTER — Encounter: Payer: Managed Care, Other (non HMO) | Admitting: Physician Assistant

## 2020-05-08 ENCOUNTER — Encounter: Payer: Managed Care, Other (non HMO) | Admitting: Physician Assistant

## 2020-05-09 ENCOUNTER — Other Ambulatory Visit: Payer: Self-pay | Admitting: Internal Medicine

## 2020-05-09 ENCOUNTER — Other Ambulatory Visit: Payer: Self-pay | Admitting: Adult Health Nurse Practitioner

## 2020-05-09 DIAGNOSIS — E1169 Type 2 diabetes mellitus with other specified complication: Secondary | ICD-10-CM

## 2020-05-09 DIAGNOSIS — K219 Gastro-esophageal reflux disease without esophagitis: Secondary | ICD-10-CM

## 2020-05-09 DIAGNOSIS — E785 Hyperlipidemia, unspecified: Secondary | ICD-10-CM

## 2020-05-09 DIAGNOSIS — E1129 Type 2 diabetes mellitus with other diabetic kidney complication: Secondary | ICD-10-CM

## 2020-05-29 ENCOUNTER — Other Ambulatory Visit: Payer: Self-pay | Admitting: Internal Medicine

## 2020-06-15 NOTE — Progress Notes (Signed)
COMPLETE PHYSICAL  Assessment / Plan:   Encounter for routine medical examination with abnormal findings Yearly  Essential hypertension Continue current medications: lisinopril 29m and  HCTZ12.51mdaily Monitor blood pressure at home; call if consistently over 130/80 Continue DASH diet.   Reminder to go to the ER if any CP, SOB, nausea, dizziness, severe HA, changes vision/speech, left arm numbness and tingling and jaw pain. -     CBC with Differential/Platelet -     BASIC METABOLIC PANEL WITH GFR -     Hepatic function panel -     TSH  Mixed hyperlipidemia Continue medications: Atorvastatin 2018maily Discussed dietary and exercise modifications Low fat diet -     Lipid panel  Fatty liver LONG discussion about cirrhosis and fatty liver -CMP   Type 2 diabetes mellitus with other diabetic kidney complication, without long-term current use of insulin ([place]CC[place]rex[place]iscussed general issues about diabetes pathophysiology and management., Educational material distributed., Suggested low cholesterol diet., Encouraged aerobic exercise., Discussed foot care., Reminded to get yearly retinal exam. Continue rybelsus to 14 mg daily, farxiga 36m58metformin 500mg52m tablets BID. Follow up 3 months  Constipation Likely related to rybelsus  Discussed doubling magnesium dose Discussed stool softeners, colace or adding metamucil to det Discussed increase water intake and dietary changes.  Morbid Obesity (BMI) of 37.0 to 37.9 in adult (HCC)[facility] for progress monitoring - increase veggies, decrease carbs - long discussion about weight loss, diet, and exercise - bring food log- continue weight loss- goal is 175  Gastroesophageal reflux disease without esophagitis Doing well at this time Continue: omprazole 40mg 82my Diet discussed Monitor for triggers Avoid food with high acid content Avoid excessive cafeine Increase water intake  Vitamin D deficiency Continue  supplementation to maintain goal of 70-100 Taking Vitamin D 5,000IU daily Defervitamin D level   Iron deficiency anemia, unspecified iron deficiency anemia type      No supplementation  OBSTRUCTIVE SLEEP APNEA Continue CPAP/BiPAP, using nightly for at least 8 hours  Helping with daytime fatigue Weight loss still advised Discussed mask & tubing hygeine  Medication management Continued  Screening, ischemic heart disease -     EKG 12-Lead  Screening for blood or protein in urine -     Urinalysis w microscopic + reflex cultur -     Microalbumin / creatinine urine ratio  Screening for thyroid disorder  Encounter for vitamin deficiency screening -     Vitamin B12    Further disposition pending results if labs check today. Discussed med's effects and SE's.   Over 30 minutes of face to face interview, exam, counseling, chart review, and critical decision making was performed.   ________________________________________________________________________________  HPI 54 y.o12female  presents for 3 month follow up with hypertension, hyperlipidemia, diabetes and vitamin D.  She reports that she is having seasonal allergies and taking zyrtec.   She was last seen on 12/12/19 for three month follow up.  Reports she is getting over a sinus infection.  Reports that she still has sinus drainage at times.  She eneed up calling telehealth place as it would not go away with symptoms management.  She complete a course of amoxicillin and is feeling much better.    Her blood pressure has been controlled at home, today their BP is BP: 128/80 She does not workout. She denies chest pain, shortness of breath, dizziness.   BMI is Body mass index is 35.53 kg/m., she is working on diet and  exercise.She has had a lot of stress with her husband having had MI with 7 way bypass in June but her son was married in april and now is expecting a baby in Jan, will not find out sex Wt Readings from Last 3  Encounters:  06/16/20 207 lb (93.9 kg)  03/16/20 202 lb (91.6 kg)  12/12/19 207 lb (93.9 kg)   She has been working on diet and exercise for  diabetes with CKD on ACE/ARB Hyperlipidemia NOT at goal of less than 70- on lipitor 59m and vascepa  she is not on bASA She is on  Rybelsus 14 mg since last visit Metformin 5053m two tablets BID Farxiga 1046m denies foot ulcerations, hyperglycemia, hypoglycemia , increased appetite, nausea, paresthesia of the feet, polydipsia, polyuria, visual disturbances, vomiting and weight loss  Last A1C in the office was:   Lab Results  Component Value Date   HGBA1C 6.8 (H) 03/16/2020   Lab Results  Component Value Date   GFRNONAA 71 03/16/2020   Lab Results  Component Value Date   CHOL 138 03/16/2020   HDL 44 (L) 03/16/2020   LDLCALC 69 03/16/2020   TRIG 170 (H) 03/16/2020   CHOLHDL 3.1 03/16/2020    Patient is on Vitamin D supplement.  Lab Results  Component Value Date   VD25OH 66 09/11/2019       Current Medications:   Current Outpatient Medications (Endocrine & Metabolic):  .  Marland KitchenARXIGA 10 MG TABS tablet, TAKE 1 TABLET ONCE DAILY FOR DIABETES. .  metFORMIN (GLUCOPHAGE-XR) 500 MG 24 hr tablet, TAKE 2 TABLETS TWICE DAILY WITH MEALS. .  Marland Kitchenemaglutide (RYBELSUS) 14 MG TABS, Take  1 tablet  Daily  for Diabetes  Current Outpatient Medications (Cardiovascular):  .  atorvastatin (LIPITOR) 20 MG tablet, Take     1 tablet     Daily    for Cholesterol .  hydrochlorothiazide (HYDRODIURIL) 12.5 MG tablet, Take 1 tablet (12.5 mg total) by mouth daily. .  Marland Kitchencosapent Ethyl (VASCEPA) 1 g capsule, Take     2 capsules      2 x /day      for Triglycerides (Blood Fats) .  lisinopril (ZESTRIL) 40 MG tablet, Take      1 tablet     Daily      for BP   Current Outpatient Medications (Analgesics):  .  aspirin EC 81 MG tablet, Take 81 mg by mouth daily.   Current Outpatient Medications (Other):  .  Marland KitchenuPROPion (WELLBUTRIN XL) 150 MG 24 hr tablet, Take 1    tablet every Morning  for Mood, Focus & Concentration .  Cholecalciferol (VITAMIN D-3) 5000 UNITS TABS, Take by mouth daily. .  Marland Kitchenlucose blood test strip, Use as instructed .  glucose monitoring kit (FREESTYLE) monitoring kit, 1 each by Does not apply route as needed for other. .  Magnesium 500 MG CAPS, Take 1 capsule by mouth daily. Taking 950m74mily .  omeprazole (PRILOSEC) 40 MG capsule, Take  1 capsule  Daily  to Prevent Heartburn & Indigestion  Medical History:  Past Medical History:  Diagnosis Date  . Colitis 07/11/2012  . Colon polyps 07/11/2012   TUBULOVILLOUS ADENOMA  . Diabetes mellitus   . Exercise-induced asthma    outside asthma   . Hernia   . Hyperlipidemia   . Hypertension   . Iron deficiency   . Kidney stones   . Obesity   . OSA on CPAP   . Vitamin D deficiency  Allergies:  Allergies  Allergen Reactions  . Welchol [Colesevelam Hcl] Hives    HIves     Review of Systems:  Review of Systems  Constitutional: Negative for chills, fever and malaise/fatigue.  HENT: Negative for congestion, ear pain and sore throat.   Eyes: Negative.   Respiratory: Negative for cough, shortness of breath and wheezing.   Cardiovascular: Negative for chest pain, palpitations and leg swelling.  Gastrointestinal: Negative for abdominal pain, blood in stool, constipation, diarrhea, heartburn and melena.  Genitourinary: Negative.   Skin: Negative.   Neurological: Negative for dizziness, sensory change, loss of consciousness and headaches.  Psychiatric/Behavioral: Negative for depression. The patient is not nervous/anxious and does not have insomnia.    SURGICAL HISTORY She  has a past surgical history that includes Tonsilectomy, adenoidectomy, bilateral myringotomy and tubes; Ablation; Abdominoplasty; Esophagogastroduodenoscopy (07/11/2012); Colonoscopy w/ biopsies (07/11/2012); Hernia repair; and Colonoscopy. FAMILY HISTORY Her family history includes Colon cancer (age of onset: 87)  in her father. She was adopted. SOCIAL HISTORY She  reports that she quit smoking about 35 years ago. She has a 2.00 pack-year smoking history. She has never used smokeless tobacco. She reports current alcohol use. She reports that she does not use drugs.   Names of Other Physician/Practitioners you currently use: 1. [facility] here for primary care 2.Eye Exam: Due for 202 3. Dental Exam Q77month, scheduled  Patient Care Team: MUnk Pinto MD as PCP - General (Internal Medicine) SLadene Artist MD as Consulting Physician (Gastroenterology) RRemer Macho MD as Attending Physician (Ophthalmology) ALenore Cordia MD (Obstetrics and Gynecology) RFrederik Pear MD as Consulting Physician (Orthopedic Surgery) WRolan Bucco MD as Consulting Physician (Urology) FSherlyn Lick MD (Dermatology)    Screening Tests: Immunization History  Administered Date(s) Administered  . Influenza Whole 12/19/2008  . Moderna Sars-Covid-2 Vaccination 05/24/2019, 06/21/2019, 02/15/2020  . PPD Test 05/23/2013  . Td 03/21/2006    Preventative care: Last colonoscopy: 04/2019, Q5 years Last mammogram: Dr AOuida SillsLast pap smear/pelvic exam: 02/2015, DUE, Dr AOuida SillsDEXA:N/A   Vaccinations: TD or Tdap: 2008, DUE, pt to check records  Influenza: Due for 2022  Pneumococcal: N/A Shingles/Zostavax: DUE discussed SARS-COV2: Booster 02/15/20   Physical Exam: BP 128/80   Pulse 77   Temp (!) 97 F (36.1 C)   Ht _0  (1.626 m)   Wt 207 lb (93.9 kg)   SpO2 98%   BMI 35.53 kg/m  Wt Readings from Last 3 Encounters:  06/16/20 207 lb (93.9 kg)  03/16/20 202 lb (91.6 kg)  12/12/19 207 lb (93.9 kg)    General Appearance: Morbidly obese, Well nourished well developed, in no apparent distress. Eyes: PERRLA, EOMs, conjunctiva no swelling or erythema ENT/Mouth: Ear canals normal without obstruction, swelling, erythma, discharge.  TMs normal  bilaterally.  Oropharynx moist, clear, without exudate, or postoropharyngeal swelling. Neck: Supple, thyroid normal,no cervical adenopathy  Respiratory: Respiratory effort normal, Breath sounds clear A&P without rhonchi, wheeze, or rale.  No retractions, no accessory usage. Cardio: RRR with no MRGs. Brisk peripheral pulses without edema.  Abdomen: Soft, + BS,  Non tender, no guarding, rebound, hernias, masses. Musculoskeletal: Full ROM, 5/5 strength, Normal gait Skin: Warm, dry without rashes, lesions, ecchymosis.  Neuro: Awake and oriented X 3, Cranial nerves intact. Normal muscle tone, no cerebellar symptoms. Psych: Normal affect, Insight and Judgment appropriate.     KGarnet Sierras ALaqueta Jean DNP [facility] 06/16/2020  9:48 AM

## 2020-06-15 NOTE — Patient Instructions (Addendum)
These will help with constipation: Colace (Docusate Sodium) Metamucil  Double the amount of magnesium you take    Try Neti Pot, use bottles or distilled water.  Can use it twice a day.   Also Try Flonase (Fluticasone) daily  Antihisamine change Zyrtec to Xyzal or Claritin.  Can also try Allegra it is different so you can add it to on in the previous group.     You ar due for the following:  Mammogram and PAP, contact GYN to schedule this.  Tetanus vaccination our records show 2008 as the last one.  Shingrix vaccination: At local pharmacy   Ask insurance and pharmacy about shingrix - it is a 2 part shot that we will not be getting in the office.   Suggest getting AFTER covid vaccines, have to wait at least a month This shot can make you feel bad due to such good immune response it can trigger some inflammation so take tylenol or aleve day of or day after and plan on resting.   Can go to [email or web address] for more information  Shingrix Vaccination  Two vaccines are licensed and recommended to prevent shingles in the [place] Zoster vaccine live (ZVL, Zostavax) has been in use since 2006. Recombinant zoster vaccine (RZV, Shingrix), has been in use since 2017 and is recommended by ACIP as the preferred shingles vaccine.  What Everyone Should Know about Shingles Vaccine (Shingrix) One of the Recommended Vaccines by Disease Shingles vaccination is the only way to protect against shingles and postherpetic neuralgia (PHN), the most common complication from shingles. CDC recommends that healthy adults 50 years and older get two doses of the shingles vaccine called Shingrix (recombinant zoster vaccine), separated by 2 to 6 months, to prevent shingles and the complications from the disease. Your doctor or pharmacist can give you Shingrix as a shot in your upper arm. Shingrix provides strong protection against shingles and PHN. Two doses of  Shingrix is more than 90% effective at preventing shingles and PHN. Protection stays above 85% for at least the first four years after you get vaccinated. Shingrix is the preferred vaccine, over Zostavax (zoster vaccine live), a shingles vaccine in use since 2006. Zostavax may still be used to prevent shingles in healthy adults 60 years and older. For example, you could use Zostavax if a person is allergic to Shingrix, prefers Zostavax, or requests immediate vaccination and Shingrix is unavailable. Who Should Get Shingrix? Healthy adults 50 years and older should get two doses of Shingrix, separated by 2 to 6 months. You should get Shingrix even if in the past you . had shingles  . received Zostavax  . are not sure if you had chickenpox There is no maximum age for getting Shingrix. If you had shingles in the past, you can get Shingrix to help prevent future occurrences of the disease. There is no specific length of time that you need to wait after having shingles before you can receive Shingrix, but generally you should make sure the shingles rash has gone away before getting vaccinated. You can get Shingrix whether or not you remember having had chickenpox in the past. Studies show that more than 99% of Americans 40 years and older have had chickenpox, even if they don't remember having the disease. Chickenpox and shingles are related because they are caused by the same virus (varicella zoster virus). After a person recovers from chickenpox, the virus stays dormant (inactive) in the body. It can reactivate years later and cause shingles. If you  had Zostavax in the recent past, you should wait at least eight weeks before getting Shingrix. Talk to your healthcare provider to determine the best time to get Shingrix. Shingrix is available in [facility] and pharmacies. To find doctor's offices or pharmacies near you that offer the vaccine, visit [employer] Vaccine FinderExternal. If you have questions  about Shingrix, talk with your healthcare provider. Vaccine for Those 20 Years and Older  Shingrix reduces the risk of shingles and PHN by more than 90% in people 50 and older. CDC recommends the vaccine for healthy adults 49 and older.  Who Should Not Get Shingrix? You should not get Shingrix if you: . have ever had a severe allergic reaction to any component of the vaccine or after a dose of Shingrix  . tested negative for immunity to varicella zoster virus. If you test negative, you should get chickenpox vaccine.  . currently have shingles  . currently are pregnant or breastfeeding. Women who are pregnant or breastfeeding should wait to get Shingrix.  Marland Kitchen receive specific antiviral drugs (acyclovir, famciclovir, or valacyclovir) 24 hours before vaccination (avoid use of these antiviral drugs for 14 days after vaccination)- zoster vaccine live only If you have a minor acute (starts suddenly) illness, such as a cold, you may get Shingrix. But if you have a moderate or severe acute illness, you should usually wait until you recover before getting the vaccine. This includes anyone with a temperature of 101.49F or higher. The side effects of the Shingrix are temporary, and usually last 2 to 3 days. While you may experience pain for a few days after getting Shingrix, the pain will be less severe than having shingles and the complications from the disease. How Well Does Shingrix Work? Two doses of Shingrix provides strong protection against shingles and postherpetic neuralgia (PHN), the most common complication of shingles. . In adults 40 to 55 years old who got two doses, Shingrix was 97% effective in preventing shingles; among adults 70 years and older, Shingrix was 91% effective.  . In adults 29 to 55 years old who got two doses, Shingrix was 91% effective in preventing PHN; among adults 70 years and older, Shingrix was 89% effective. Shingrix protection remained high (more than 85%) in people 70 years  and older throughout the four years following vaccination. Since your risk of shingles and PHN increases as you get older, it is important to have strong protection against shingles in your older years. Top of Page  What Are the Possible Side Effects of Shingrix? Studies show that Shingrix is safe. The vaccine helps your body create a strong defense against shingles. As a result, you are likely to have temporary side effects from getting the shots. The side effects may affect your ability to do normal daily activities for 2 to 3 days. Most people got a sore arm with mild or moderate pain after getting Shingrix, and some also had redness and swelling where they got the shot. Some people felt tired, had muscle pain, a headache, shivering, fever, stomach pain, or nausea. About 1 out of 6 people who got Shingrix experienced side effects that prevented them from doing regular activities. Symptoms went away on their own in about 2 to 3 days. Side effects were more common in younger people. You might have a reaction to the first or second dose of Shingrix, or both doses. If you experience side effects, you may choose to take over-the-counter pain medicine such as ibuprofen or acetaminophen. If you experience  side effects from Shingrix, you should report them to the Vaccine Adverse Event Reporting System (VAERS). Your doctor might file this report, or you can do it yourself through the VAERS websiteExternal, or by calling [phone]. If you have any questions about side effects from Shingrix, talk with your doctor. The shingles vaccine does not contain thimerosal (a preservative containing mercury). Top of Page  When Should I See a Doctor Because of the Side Effects I Experience From Shingrix? In clinical trials, Shingrix was not associated with serious adverse events. In fact, serious side effects from vaccines are extremely rare. For example, for every 1 million doses of a vaccine given, only one or two  people may have a severe allergic reaction. Signs of an allergic reaction happen within minutes or hours after vaccination and include hives, swelling of the face and throat, difficulty breathing, a fast heartbeat, dizziness, or weakness. If you experience these or any other life-threatening symptoms, see a doctor right away. Shingrix causes a strong response in your immune system, so it may produce short-term side effects more intense than you are used to from other vaccines. These side effects can be uncomfortable, but they are expected and usually go away on their own in 2 or 3 days. Top of Page  How Can I Pay For Shingrix? There are several ways shingles vaccine may be paid for: Medicare . Medicare Part D plans cover the shingles vaccine, but there may be a cost to you depending on your plan. There may be a copay for the vaccine, or you may need to pay in full then get reimbursed for a certain amount.  . Medicare Part B does not cover the shingles vaccine. Medicaid . Medicaid may or may not cover the vaccine. Contact your insurer to find out. Private health insurance . Many private health insurance plans will cover the vaccine, but there may be a cost to you depending on your plan. Contact your insurer to find out. Vaccine assistance programs . Some pharmaceutical companies provide vaccines to eligible adults who cannot afford them. You may want to check with the vaccine manufacturer, [employer], about Shingrix. If you do not currently have health insurance, learn more about affordable health coverage optionsExternal. To find doctor's offices or pharmacies near you that offer the vaccine, visit [employer]  Know what a healthy weight is for you (roughly BMI <25) and aim to maintain this  Aim for 7+ servings of fruits and vegetables daily  70-80+ fluid ounces of water or unsweet tea for healthy kidneys  Limit to max 1 drink of alcohol  per day; avoid smoking/tobacco  Limit animal fats in diet for cholesterol and heart health - choose grass fed whenever available  Avoid highly processed foods, and foods high in saturated/trans fats  Aim for low stress - take time to unwind and care for your mental health  Aim for 150 min of moderate intensity exercise weekly for heart health, and weights twice weekly for bone health  Aim for 7-9 hours of sleep daily

## 2020-06-16 ENCOUNTER — Encounter: Payer: Self-pay | Admitting: Adult Health Nurse Practitioner

## 2020-06-16 ENCOUNTER — Ambulatory Visit (INDEPENDENT_AMBULATORY_CARE_PROVIDER_SITE_OTHER): Payer: Managed Care, Other (non HMO) | Admitting: Adult Health Nurse Practitioner

## 2020-06-16 ENCOUNTER — Other Ambulatory Visit: Payer: Self-pay

## 2020-06-16 VITALS — BP 128/80 | HR 77 | Temp 97.0°F | Ht 64.0 in | Wt 207.0 lb

## 2020-06-16 DIAGNOSIS — I10 Essential (primary) hypertension: Secondary | ICD-10-CM | POA: Diagnosis not present

## 2020-06-16 DIAGNOSIS — Z13220 Encounter for screening for lipoid disorders: Secondary | ICD-10-CM | POA: Diagnosis not present

## 2020-06-16 DIAGNOSIS — Z131 Encounter for screening for diabetes mellitus: Secondary | ICD-10-CM | POA: Diagnosis not present

## 2020-06-16 DIAGNOSIS — Z0001 Encounter for general adult medical examination with abnormal findings: Secondary | ICD-10-CM

## 2020-06-16 DIAGNOSIS — E559 Vitamin D deficiency, unspecified: Secondary | ICD-10-CM

## 2020-06-16 DIAGNOSIS — Z136 Encounter for screening for cardiovascular disorders: Secondary | ICD-10-CM | POA: Diagnosis not present

## 2020-06-16 DIAGNOSIS — E1129 Type 2 diabetes mellitus with other diabetic kidney complication: Secondary | ICD-10-CM

## 2020-06-16 DIAGNOSIS — E1169 Type 2 diabetes mellitus with other specified complication: Secondary | ICD-10-CM

## 2020-06-16 DIAGNOSIS — Z1389 Encounter for screening for other disorder: Secondary | ICD-10-CM

## 2020-06-16 DIAGNOSIS — K5903 Drug induced constipation: Secondary | ICD-10-CM

## 2020-06-16 DIAGNOSIS — E785 Hyperlipidemia, unspecified: Secondary | ICD-10-CM

## 2020-06-16 DIAGNOSIS — Z130 Encounter for screening for diseases of the blood and blood-forming organs and certain disorders involving the immune mechanism: Secondary | ICD-10-CM

## 2020-06-16 DIAGNOSIS — Z0000 Encounter for general adult medical examination without abnormal findings: Secondary | ICD-10-CM | POA: Diagnosis not present

## 2020-06-16 DIAGNOSIS — Z79899 Other long term (current) drug therapy: Secondary | ICD-10-CM

## 2020-06-16 DIAGNOSIS — Z1329 Encounter for screening for other suspected endocrine disorder: Secondary | ICD-10-CM

## 2020-06-16 DIAGNOSIS — K219 Gastro-esophageal reflux disease without esophagitis: Secondary | ICD-10-CM

## 2020-06-16 DIAGNOSIS — G4733 Obstructive sleep apnea (adult) (pediatric): Secondary | ICD-10-CM

## 2020-06-16 DIAGNOSIS — Z1321 Encounter for screening for nutritional disorder: Secondary | ICD-10-CM

## 2020-06-16 DIAGNOSIS — K760 Fatty (change of) liver, not elsewhere classified: Secondary | ICD-10-CM

## 2020-06-16 DIAGNOSIS — D509 Iron deficiency anemia, unspecified: Secondary | ICD-10-CM

## 2020-06-17 LAB — COMPLETE METABOLIC PANEL WITH GFR
AG Ratio: 1.9 (calc) (ref 1.0–2.5)
ALT: 34 U/L — ABNORMAL HIGH (ref 6–29)
AST: 18 U/L (ref 10–35)
Albumin: 4.5 g/dL (ref 3.6–5.1)
Alkaline phosphatase (APISO): 55 U/L (ref 37–153)
BUN: 17 mg/dL (ref 7–25)
CO2: 31 mmol/L (ref 20–32)
Calcium: 10 mg/dL (ref 8.6–10.4)
Chloride: 103 mmol/L (ref 98–110)
Creat: 0.84 mg/dL (ref 0.50–1.05)
GFR, Est African American: 91 mL/min/{1.73_m2} (ref >=60)
GFR, Est Non African American: 79 mL/min/{1.73_m2} (ref >=60)
Globulin: 2.4 g/dL (calc) (ref 1.9–3.7)
Glucose, Bld: 120 mg/dL — ABNORMAL HIGH (ref 65–99)
Potassium: 4.4 mmol/L (ref 3.5–5.3)
Sodium: 142 mmol/L (ref 135–146)
Total Bilirubin: 0.5 mg/dL (ref 0.2–1.2)
Total Protein: 6.9 g/dL (ref 6.1–8.1)

## 2020-06-17 LAB — MAGNESIUM: Magnesium: 2 mg/dL (ref 1.5–2.5)

## 2020-06-17 LAB — TSH: TSH: 1.3 mIU/L

## 2020-06-17 LAB — CBC WITH DIFFERENTIAL/PLATELET
Absolute Monocytes: 290 cells/uL (ref 200–950)
Basophils Absolute: 59 cells/uL (ref 0–200)
Basophils Relative: 0.9 %
Eosinophils Absolute: 119 cells/uL (ref 15–500)
Eosinophils Relative: 1.8 %
HCT: 44.2 % (ref 35.0–45.0)
Hemoglobin: 14.7 g/dL (ref 11.7–15.5)
Lymphs Abs: 3155 cells/uL (ref 850–3900)
MCH: 28.2 pg (ref 27.0–33.0)
MCHC: 33.3 g/dL (ref 32.0–36.0)
MCV: 84.7 fL (ref 80.0–100.0)
MPV: 9.8 fL (ref 7.5–12.5)
Monocytes Relative: 4.4 %
Neutro Abs: 2977 cells/uL (ref 1500–7800)
Neutrophils Relative %: 45.1 %
Platelets: 320 10*3/uL (ref 140–400)
RBC: 5.22 10*6/uL — ABNORMAL HIGH (ref 3.80–5.10)
RDW: 13.2 % (ref 11.0–15.0)
Total Lymphocyte: 47.8 %
WBC: 6.6 10*3/uL (ref 3.8–10.8)

## 2020-06-17 LAB — MICROALBUMIN / CREATININE URINE RATIO
Creatinine, Urine: 109 mg/dL (ref 20–275)
Microalb Creat Ratio: 6 mcg/mg creat (ref <30)
Microalb, Ur: 0.6 mg/dL

## 2020-06-17 LAB — IRON, TOTAL/TOTAL IRON BINDING CAP
%SAT: 17 % (calc) (ref 16–45)
Iron: 68 ug/dL (ref 45–160)
TIBC: 402 mcg/dL (calc) (ref 250–450)

## 2020-06-17 LAB — HEMOGLOBIN A1C
Hgb A1c MFr Bld: 7.2 % of total Hgb — ABNORMAL HIGH (ref <5.7)
Mean Plasma Glucose: 160 mg/dL
eAG (mmol/L): 8.9 mmol/L

## 2020-06-17 LAB — URINALYSIS W MICROSCOPIC + REFLEX CULTURE
Bacteria, UA: NONE SEEN /HPF
Bilirubin Urine: NEGATIVE
Hgb urine dipstick: NEGATIVE
Hyaline Cast: NONE SEEN /LPF
Ketones, ur: NEGATIVE
Leukocyte Esterase: NEGATIVE
Nitrites, Initial: NEGATIVE
Protein, ur: NEGATIVE
RBC / HPF: NONE SEEN /HPF (ref 0–2)
Specific Gravity, Urine: 1.038 — ABNORMAL HIGH (ref 1.001–1.03)
pH: <=5 (ref 5.0–8.0)

## 2020-06-17 LAB — NO CULTURE INDICATED

## 2020-06-17 LAB — LIPID PANEL
Cholesterol: 144 mg/dL (ref <200)
HDL: 46 mg/dL — ABNORMAL LOW (ref >=50)
LDL Cholesterol (Calc): 73 mg/dL (calc)
Non-HDL Cholesterol (Calc): 98 mg/dL (calc) (ref <130)
Total CHOL/HDL Ratio: 3.1 (calc) (ref <5.0)
Triglycerides: 169 mg/dL — ABNORMAL HIGH (ref <150)

## 2020-06-17 LAB — VITAMIN B12: Vitamin B-12: 318 pg/mL (ref 200–1100)

## 2020-06-17 LAB — VITAMIN D 25 HYDROXY (VIT D DEFICIENCY, FRACTURES): Vit D, 25-Hydroxy: 69 ng/mL (ref 30–100)

## 2020-07-12 ENCOUNTER — Other Ambulatory Visit: Payer: Self-pay | Admitting: Adult Health

## 2020-07-12 ENCOUNTER — Other Ambulatory Visit: Payer: Self-pay | Admitting: Internal Medicine

## 2020-08-08 ENCOUNTER — Other Ambulatory Visit: Payer: Self-pay | Admitting: Internal Medicine

## 2020-08-08 DIAGNOSIS — K219 Gastro-esophageal reflux disease without esophagitis: Secondary | ICD-10-CM

## 2020-08-08 DIAGNOSIS — E1129 Type 2 diabetes mellitus with other diabetic kidney complication: Secondary | ICD-10-CM

## 2020-08-08 DIAGNOSIS — E1169 Type 2 diabetes mellitus with other specified complication: Secondary | ICD-10-CM

## 2020-08-08 MED ORDER — OMEPRAZOLE 40 MG PO CPDR: DELAYED_RELEASE_CAPSULE | ORAL | 3 refills | Status: DC

## 2020-08-11 ENCOUNTER — Other Ambulatory Visit: Payer: Self-pay | Admitting: Internal Medicine

## 2020-08-11 DIAGNOSIS — K219 Gastro-esophageal reflux disease without esophagitis: Secondary | ICD-10-CM

## 2020-09-13 ENCOUNTER — Other Ambulatory Visit: Payer: Self-pay | Admitting: Adult Health

## 2020-09-13 ENCOUNTER — Other Ambulatory Visit: Payer: Self-pay | Admitting: Internal Medicine

## 2020-09-13 DIAGNOSIS — E1169 Type 2 diabetes mellitus with other specified complication: Secondary | ICD-10-CM

## 2020-09-13 DIAGNOSIS — E1129 Type 2 diabetes mellitus with other diabetic kidney complication: Secondary | ICD-10-CM

## 2020-09-13 DIAGNOSIS — E785 Hyperlipidemia, unspecified: Secondary | ICD-10-CM

## 2020-09-13 MED ORDER — ICOSAPENT ETHYL 1 G PO CAPS: ORAL_CAPSULE | ORAL | 3 refills | Status: DC

## 2020-10-03 ENCOUNTER — Other Ambulatory Visit: Payer: Self-pay | Admitting: Internal Medicine

## 2021-01-03 NOTE — Progress Notes (Addendum)
RESCHEDULED

## 2021-01-04 ENCOUNTER — Ambulatory Visit: Payer: Managed Care, Other (non HMO) | Admitting: Internal Medicine

## 2021-01-04 DIAGNOSIS — E1169 Type 2 diabetes mellitus with other specified complication: Secondary | ICD-10-CM

## 2021-01-04 DIAGNOSIS — I10 Essential (primary) hypertension: Secondary | ICD-10-CM

## 2021-01-04 DIAGNOSIS — E559 Vitamin D deficiency, unspecified: Secondary | ICD-10-CM

## 2021-01-04 DIAGNOSIS — Z79899 Other long term (current) drug therapy: Secondary | ICD-10-CM

## 2021-01-04 DIAGNOSIS — E1122 Type 2 diabetes mellitus with diabetic chronic kidney disease: Secondary | ICD-10-CM

## 2021-01-11 ENCOUNTER — Ambulatory Visit: Payer: Managed Care, Other (non HMO) | Admitting: Internal Medicine

## 2021-01-11 ENCOUNTER — Encounter: Payer: Self-pay | Admitting: Internal Medicine

## 2021-01-11 ENCOUNTER — Other Ambulatory Visit: Payer: Self-pay

## 2021-01-11 VITALS — BP 132/78 | HR 83 | Temp 97.8°F | Resp 16 | Ht 64.0 in | Wt 207.6 lb

## 2021-01-11 DIAGNOSIS — E1122 Type 2 diabetes mellitus with diabetic chronic kidney disease: Secondary | ICD-10-CM | POA: Diagnosis not present

## 2021-01-11 DIAGNOSIS — N182 Chronic kidney disease, stage 2 (mild): Secondary | ICD-10-CM

## 2021-01-11 DIAGNOSIS — I10 Essential (primary) hypertension: Secondary | ICD-10-CM | POA: Diagnosis not present

## 2021-01-11 DIAGNOSIS — Z79899 Other long term (current) drug therapy: Secondary | ICD-10-CM | POA: Diagnosis not present

## 2021-01-11 DIAGNOSIS — E785 Hyperlipidemia, unspecified: Secondary | ICD-10-CM | POA: Diagnosis not present

## 2021-01-11 DIAGNOSIS — E559 Vitamin D deficiency, unspecified: Secondary | ICD-10-CM | POA: Diagnosis not present

## 2021-01-11 DIAGNOSIS — E1169 Type 2 diabetes mellitus with other specified complication: Secondary | ICD-10-CM

## 2021-01-11 MED ORDER — MOUNJARO 10 MG/0.5ML ~~LOC~~ SOAJ: 10.0000 mg | SUBCUTANEOUS | 1 refills | Status: DC

## 2021-01-11 NOTE — Progress Notes (Signed)
Future Appointments  Date Time Provider [place]  01/11/2021  4:30 PM Unk Pinto, MD [facility] None  06/16/2021    CPE   9:00 AM Liane Comber, NP [facility] None    History of Present Illness:       This very nice 55 y.o.  MWF presents for 12month follow up with HTN, HLD, Pre-Diabetes and Vitamin D Deficiency.        Patient is treated for HTN (1990) & BP has been controlled at home. Today's BP 132/78. Patient has had no complaints of any cardiac type chest pain, palpitations, dyspnea Vertell Limber /PND, dizziness, claudication, or dependent edema.       Hyperlipidemia is controlled with diet & Atorvastatin . Patient denies myalgias or other med SE's. 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 Patient denies  symptoms of reactive hypoglycemia, diabetic polys, paresthesias or visual blurring.  Last A1c was not at goal :   Lab Results  Component Value Date   HGBA1C 7.2 (H) 06/16/2020                                                            Further, the patient also has history of Vitamin D Deficiency ("21"/2011) and supplements vitamin D without any suspected side-effects. Last vitamin D was at  goal:  Lab Results  Component Value Date   VD25OH 69 06/16/2020     Current Outpatient Medications on File Prior to Visit  Medication Sig   aspirin EC 81 MG tablet Take  daily.   atorvastatin  20 MG tablet Take  1 tablet  Daily     buPROPion XL 150 MG  Take 1  tablet  every Morning     VITAMIN D 5000 UNITS Take  daily.   dapagliflozin propanediol  10 MG TABS tablet Take 1 tablet  Daily  f   hydrochlorothiazide (  12.5 MG tablet Take 1 tablet Daily    icosapent Ethyl (VASCEPA) 1 g capsule Take  2 capsules  2 x /day  with Meals     lisinopril 40 MG tablet Take  1 tablet  Daily     Magnesium  Take 1 capsule 950mg  daily   metFORMIN -XR) 500 MG  Take  2 tablets  2 x /day     omeprazole 40 MG capsule TAKE ONE CAPSULE DAILY    Semaglutide (RYBELSUS) 14 MG TABS Take 1 tablet  Daily  for Diabetes    Allergies  Allergen Reactions   Welchol [Colesevelam Hcl] Hives    HIves     PMHx:   Past Medical History:  Diagnosis Date   Colitis 07/11/2012   Colon polyps 07/11/2012   TUBULOVILLOUS ADENOMA   Diabetes mellitus    Exercise-induced asthma    outside asthma    Hernia    Hyperlipidemia    Hypertension    Iron deficiency    Kidney stones  Obesity    OSA on CPAP    Vitamin D deficiency      Immunization History  Administered Date(s) Administered   Influenza Whole 12/19/2008   Moderna Sars-Covid-2 Vaccination 05/24/2019, 06/21/2019, 02/15/2020   PPD Test 05/23/2013   Td 03/21/2006     Past Surgical History:  Procedure Laterality Date   ABDOMINOPLASTY     ABLATION     COLONOSCOPY     COLONOSCOPY W/ BIOPSIES  07/11/2012   ESOPHAGOGASTRODUODENOSCOPY  07/11/2012   Normal    HERNIA REPAIR     2017, has had repaired x 3, [place]   TONSILECTOMY, ADENOIDECTOMY, BILATERAL MYRINGOTOMY AND TUBES      FHx:    Reviewed / unchanged  SHx:    Reviewed / unchanged   Systems Review:  Constitutional: Denies fever, chills, wt changes, headaches, insomnia, fatigue, night sweats, change in appetite. Eyes: Denies redness, blurred vision, diplopia, discharge, itchy, watery eyes.  ENT: Denies discharge, congestion, post nasal drip, epistaxis, sore throat, earache, hearing loss, dental pain, tinnitus, vertigo, sinus pain, snoring.  CV: Denies chest pain, palpitations, irregular heartbeat, syncope, dyspnea, diaphoresis, orthopnea, PND, claudication or edema. Respiratory: denies cough, dyspnea, DOE,  pleurisy, hoarseness, laryngitis, wheezing.  Gastrointestinal: Denies dysphagia, odynophagia, heartburn, reflux, water brash, abdominal pain or cramps, nausea, vomiting, bloating, diarrhea, constipation, hematemesis, melena, hematochezia  or hemorrhoids. Genitourinary: Denies dysuria, frequency, urgency, nocturia, hesitancy, discharge, hematuria or flank pain. Musculoskeletal: Denies arthralgias, myalgias, stiffness, jt. swelling, pain, limping or strain/sprain.  Skin: Denies pruritus, rash, hives, warts, acne, eczema or change in skin lesion(s). Neuro: No weakness, tremor, incoordination, spasms, paresthesia or pain. Psychiatric: Denies confusion, memory loss or sensory loss. Endo: Denies change in weight, skin or hair change.  Heme/Lymph: No excessive bleeding, bruising or enlarged lymph nodes.  Physical Exam  BP 132/78   Pulse 83   Temp 97.8 F (36.6 C)   Resp 16   Ht 5\' 4"  (1.626 m)   Wt 207 lb 9.6 oz (94.2 kg)   SpO2 98%   BMI 35.63 kg/m   Appears  overnourished, well groomed  and in no distress.  Eyes: PERRLA, EOMs, conjunctiva no swelling or erythema. Sinuses: No frontal/maxillary tenderness ENT/Mouth: EAC's clear, TM's nl w/o erythema, bulging. Nares clear w/o erythema, swelling, exudates. Oropharynx clear without erythema or exudates. Oral hygiene is good. Tongue normal, non obstructing. Hearing intact.  Neck: Supple. Thyroid not palpable. Car 2+/2+ without bruits, nodes or JVD. Chest: Respirations nl with BS clear & equal w/o rales, rhonchi, wheezing or stridor.  Cor: Heart sounds normal w/ regular rate and rhythm without sig. murmurs, gallops, clicks or rubs. Peripheral pulses normal and equal  without edema.  Abdomen: Soft & bowel sounds normal. Non-tender w/o guarding, rebound, hernias, masses or organomegaly.  Lymphatics: Unremarkable.  Musculoskeletal: Full ROM all peripheral extremities, joint stability, 5/5 strength and normal gait.  Skin: Warm, dry without exposed  rashes, lesions or ecchymosis apparent.  Neuro: Cranial nerves intact, reflexes equal bilaterally. Sensory-motor testing grossly intact. Tendon reflexes grossly intact.  Pysch: Alert & oriented x 3.  Insight and judgement nl & appropriate. No ideations.  Assessment and Plan:  1. Essential hypertension  - Continue medication, monitor blood pressure at home.  - Continue DASH diet.  Reminder to go to the ER if any CP,  SOB, nausea, dizziness, severe HA, changes vision/speech.   - CBC with Differential/Platelet - COMPLETE METABOLIC PANEL WITH GFR - Magnesium - TSH  2. Hyperlipidemia associated with type 2 diabetes mellitus ([place])  -  Continue diet/meds, exercise,& lifestyle modifications.  - Continue monitor periodic cholesterol/liver & renal functions    - Lipid panel - TSH  3. Type 2 diabetes mellitus with stage 2 chronic kidney  disease, without long-term current use of insulin (HCC)  - discussed with patient changing Rybelsus to Mounjaro 10 mg /week x 4 weeks     ( And d/c Rybelsus)   - Continue diet, exercise  - Lifestyle modifications.  - Monitor appropriate labs   - Hemoglobin A1c - Insulin, random  4. Vitamin D deficiency  - VITAMIN D 25 Hydroxy  - Continue supplementation.  5. Class 2 severe obesity with body mass index  (BMI) of 35 to 39.9 with serious comorbidity (HCC)  - TSH  6. Medication management  - CBC with Differential/Platelet - COMPLETE METABOLIC PANEL WITH GFR - Magnesium - Lipid panel - TSH - Hemoglobin A1c - Insulin, random - VITAMIN D 25 Hydroxy          Discussed  regular exercise, BP monitoring, weight control to achieve/maintain BMI less than 25 and discussed med and SE's. Recommended labs to assess and monitor clinical status with further disposition pending results of labs.  I discussed the assessment and treatment plan with the patient. The patient was provided an opportunity to ask questions and all were answered. The patient  agreed with the plan and demonstrated an understanding of the instructions.  I provided over 30 minutes of exam, counseling, chart review and  complex critical decision making.        The patient was advised to call back or seek an in-person evaluation if the symptoms worsen or if the condition fails to improve as anticipated.   Kirtland Bouchard, MD

## 2021-01-11 NOTE — Patient Instructions (Signed)

## 2021-01-12 LAB — COMPLETE METABOLIC PANEL WITH GFR
AG Ratio: 2 (calc) (ref 1.0–2.5)
ALT: 33 U/L — ABNORMAL HIGH (ref 6–29)
AST: 19 U/L (ref 10–35)
Albumin: 4.7 g/dL (ref 3.6–5.1)
Alkaline phosphatase (APISO): 56 U/L (ref 37–153)
BUN: 13 mg/dL (ref 7–25)
CO2: 32 mmol/L (ref 20–32)
Calcium: 10 mg/dL (ref 8.6–10.4)
Chloride: 101 mmol/L (ref 98–110)
Creat: 0.82 mg/dL (ref 0.50–1.03)
Globulin: 2.3 g/dL (calc) (ref 1.9–3.7)
Glucose, Bld: 139 mg/dL — ABNORMAL HIGH (ref 65–99)
Potassium: 3.8 mmol/L (ref 3.5–5.3)
Sodium: 141 mmol/L (ref 135–146)
Total Bilirubin: 0.5 mg/dL (ref 0.2–1.2)
Total Protein: 7 g/dL (ref 6.1–8.1)
eGFR: 84 mL/min/{1.73_m2} (ref >=60)

## 2021-01-12 LAB — LIPID PANEL
Cholesterol: 142 mg/dL (ref <200)
HDL: 42 mg/dL — ABNORMAL LOW (ref >=50)
LDL Cholesterol (Calc): 67 mg/dL (calc)
Non-HDL Cholesterol (Calc): 100 mg/dL (calc) (ref <130)
Total CHOL/HDL Ratio: 3.4 (calc) (ref <5.0)
Triglycerides: 253 mg/dL — ABNORMAL HIGH (ref <150)

## 2021-01-12 LAB — CBC WITH DIFFERENTIAL/PLATELET
Absolute Monocytes: 434 cells/uL (ref 200–950)
Basophils Absolute: 63 cells/uL (ref 0–200)
Basophils Relative: 0.9 %
Eosinophils Absolute: 133 cells/uL (ref 15–500)
Eosinophils Relative: 1.9 %
HCT: 44.5 % (ref 35.0–45.0)
Hemoglobin: 14.8 g/dL (ref 11.7–15.5)
Lymphs Abs: 3444 cells/uL (ref 850–3900)
MCH: 28 pg (ref 27.0–33.0)
MCHC: 33.3 g/dL (ref 32.0–36.0)
MCV: 84.3 fL (ref 80.0–100.0)
MPV: 9.5 fL (ref 7.5–12.5)
Monocytes Relative: 6.2 %
Neutro Abs: 2926 cells/uL (ref 1500–7800)
Neutrophils Relative %: 41.8 %
Platelets: 351 10*3/uL (ref 140–400)
RBC: 5.28 10*6/uL — ABNORMAL HIGH (ref 3.80–5.10)
RDW: 13.4 % (ref 11.0–15.0)
Total Lymphocyte: 49.2 %
WBC: 7 10*3/uL (ref 3.8–10.8)

## 2021-01-12 LAB — HEMOGLOBIN A1C
Hgb A1c MFr Bld: 7.2 % of total Hgb — ABNORMAL HIGH (ref <5.7)
Mean Plasma Glucose: 160 mg/dL
eAG (mmol/L): 8.9 mmol/L

## 2021-01-12 LAB — VITAMIN D 25 HYDROXY (VIT D DEFICIENCY, FRACTURES): Vit D, 25-Hydroxy: 52 ng/mL (ref 30–100)

## 2021-01-12 LAB — TSH: TSH: 1.78 mIU/L

## 2021-01-12 LAB — MAGNESIUM: Magnesium: 2 mg/dL (ref 1.5–2.5)

## 2021-01-12 LAB — INSULIN, RANDOM: Insulin: 65.7 u[IU]/mL — ABNORMAL HIGH

## 2021-01-12 NOTE — Progress Notes (Signed)
============================================================ - Test results slightly outside the reference range are not unusual. If there is anything important, I will review this with you,  otherwise it is considered normal test values.  If you have further questions,  please do not hesitate to contact me at the office or via My Chart.  ============================================================ ============================================================  -  Total Chol = 142    &   LDL Chol = 67   - Both    Excellent   - Very low risk for Heart Attack  / Stroke ============================================================ ============================================================  -  But  Triglycerides (   253    ) or fats in blood are too high  (goal is less than 150)    - Recommend avoid fried & greasy foods,  sweets / candy,   - Avoid white rice  (brown or wild rice or Quinoa is OK),   - Avoid white potatoes  (sweet potatoes are OK)   - Avoid anything made from white flour  - bagels, doughnuts, rolls, buns, biscuits, white and   wheat breads, pizza crust and traditional  pasta made of white flour & egg white  - (vegetarian pasta or spinach or wheat pasta is OK).    - Multi-grain bread is OK - like multi-grain flat bread or  sandwich thins.   - Avoid alcohol in excess.   - Exercise is also important. ============================================================ ============================================================  -  A1c = 7.2% - Still too high with 300 % increased risk for                                                                              Heart Attack , Stroke & Cancer   - But the good News is with better diet  &  weight loss                                                                                   &  normalization of your A1c   - You can cure your Diabetes & Add 30 years to your Life  !  ! ============================================================ ============================================================  -  Vitamin D = 52 - Sl low   - Vitamin D goal is between 70-100.   - Please make sure that you are taking your Vitamin D as directed.   - It is very important as a natural anti-inflammatory and helping the  immune system protect against viral infections, like the Covid-19    helping hair, skin, and nails, as well as reducing stroke and  heart attack risk.   - It helps your bones and helps with mood.  - It also decreases numerous cancer risks so please  take it as directed.   - Low Vit D is associated with a 200-300% higher risk for  CANCER   and 200-300% higher risk for HEART   ATTACK  &  STROKE.    -  It is also associated with higher death rate at younger ages,   autoimmune diseases like Rheumatoid arthritis, Lupus,  Multiple Sclerosis.     - Also many other serious conditions, like depression, Alzheimer's  Dementia, infertility, muscle aches, fatigue, fibromyalgia   - just to name a few. ============================================================ ============================================================  -  All Else - CBC - Kidneys - Electrolytes - Liver - Magnesium & Thyroid    - all  Normal / OK ============================================================ ============================================================

## 2021-01-14 ENCOUNTER — Ambulatory Visit: Payer: Managed Care, Other (non HMO) | Admitting: Nurse Practitioner

## 2021-01-14 ENCOUNTER — Other Ambulatory Visit: Payer: Self-pay

## 2021-01-14 ENCOUNTER — Encounter: Payer: Self-pay | Admitting: Nurse Practitioner

## 2021-01-14 VITALS — HR 81

## 2021-01-14 DIAGNOSIS — U071 COVID-19: Secondary | ICD-10-CM

## 2021-01-14 MED ORDER — MOLNUPIRAVIR EUA 200MG CAPSULE: 4.0000 | ORAL_CAPSULE | Freq: Two times a day (BID) | ORAL | 0 refills | Status: AC

## 2021-01-14 MED ORDER — PROMETHAZINE-DM 6.25-15 MG/5ML PO SYRP: 5.0000 mL | ORAL_SOLUTION | Freq: Four times a day (QID) | ORAL | 1 refills | Status: DC | PRN

## 2021-01-14 NOTE — Progress Notes (Signed)
THIS ENCOUNTER IS A VIRTUAL VISIT DUE TO COVID-19 - PATIENT WAS NOT SEEN IN THE OFFICE.  PATIENT HAS CONSENTED TO VIRTUAL VISIT / TELEMEDICINE VISIT   Virtual Visit via telephone Note  I connected with  Montel Clock on 01/14/2021 by telephone.  I verified that I am speaking with the correct person using two identifiers.    I discussed the limitations of evaluation and management by telemedicine and the availability of in person appointments. The patient expressed understanding and agreed to proceed.  History of Present Illness:  Pulse 81   SpO2 98%  55 y.o. patient contacted office reporting URI sx congestion, sore throat. she tested positive by home covid test. OV was conducted by telephone to minimize exposure. This patient was vaccinated for covid [place] last year 02/15/20  Sx began 2 days ago with cough, congestion, sore throat  Treatments tried so far: Coldeeze, Vit D, Vit C  Exposures: husband   Medications  Current Outpatient Medications (Endocrine & Metabolic):    dapagliflozin propanediol (FARXIGA) 10 MG TABS tablet, Take 1 tablet  Daily  for Diabetes   metFORMIN (GLUCOPHAGE-XR) 500 MG 24 hr tablet, Take  2 tablets  2 x /day  with Meals  for Diabetes   tirzepatide (MOUNJARO) 10 MG/0.5ML Pen, Inject 10 mg into the skin once a week.  Current Outpatient Medications (Cardiovascular):    atorvastatin (LIPITOR) 20 MG tablet, Take  1 tablet  Daily  for Cholesterol   hydrochlorothiazide (HYDRODIURIL) 12.5 MG tablet, Take 1 tablet Daily for BP & Fluid Retention /Ankle swelling   icosapent Ethyl (VASCEPA) 1 g capsule, Take  2 capsules  2 x /day  with Meals  for Triglycerides (Blood Fats)   lisinopril (ZESTRIL) 40 MG tablet, Take  1 tablet  Daily  for BP  / Patient knows to take by mouth  Current Outpatient Medications (Respiratory):    promethazine-dextromethorphan (PROMETHAZINE-DM) 6.25-15 MG/5ML syrup, Take 5 mLs by mouth 4 (four) times daily as needed for  cough.  Current Outpatient Medications (Analgesics):    aspirin EC 81 MG tablet, Take 81 mg by mouth daily. (Patient not taking: Reported on 01/11/2021)   Current Outpatient Medications (Other):    molnupiravir EUA (LAGEVRIO) 200 mg CAPS capsule, Take 4 capsules (800 mg total) by mouth 2 (two) times daily for 5 days.   buPROPion (WELLBUTRIN XL) 150 MG 24 hr tablet, Take 1  tablet  every Morning  for Mood, Focus & Concentration   Cholecalciferol (VITAMIN D-3) 5000 UNITS TABS, Take by mouth daily.   Magnesium 500 MG CAPS, Take 1 capsule by mouth daily. 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   Social History:   reports that she quit smoking about 35 years ago. Her smoking use included cigarettes. She has a 2.00 pack-year smoking history. She has never used smokeless tobacco. She reports current alcohol use. She reports that she does not use drugs.  Observations/Objective:  General : Well sounding patient in no apparent distress HEENT: no hoarseness, no cough for duration of visit Lungs: speaks in complete sentences, no audible wheezing, no apparent distress Neurological: alert, oriented x 3 Psychiatric: pleasant, judgement appropriate   Assessment and Plan:  Covid 19 Covid 19 positive per rapid screening test at home today 01/14/21 Risk factors include:  diabetes, hypertension, obesity Symptoms are: mild  -     molnupiravir EUA (LAGEVRIO) 200 mg CAPS capsule; Take 4 capsules (800 mg total) by mouth 2 (two) times daily for 5 days. -     promethazine-dextromethorphan (PROMETHAZINE-DM) 6.25-15 MG/5ML syrup; Take 5 mLs by mouth 4 (four) times daily as needed for cough.   Immue support with vitamin D 1000 mg daily, zinc 50 mg daily, vitamin D Reviewed doing regular breathing exercises, proning Take tylenol PRN temp 101+ Push hydration Regular ambulation or calf exercises exercises for clot prevention and 81 mg ASA unless contraindicated Sx supportive therapy suggested Follow up via mychart or telephone if needed Advised patient obtain O2 monitor; present to ED if persistently <88% or with severe dyspnea, CP, fever uncontrolled by tylenol, confusion, sudden decline Should remain in isolation until at least 5 days from onset of sx, 24-48 hours fever free without tylenol, sx such as cough are improved.      Follow Up Instructions:  I discussed the assessment and treatment plan with the patient. The patient was provided an opportunity to ask questions and all were answered. The patient agreed with the plan and demonstrated an understanding of the instructions.   The patient was advised to call back or seek an in-person evaluation if the symptoms worsen or if the condition fails to improve as anticipated.  I provided 20 minutes of non-face-to-face time during this encounter.   Magda Bernheim, NP

## 2021-02-23 ENCOUNTER — Encounter: Payer: Self-pay | Admitting: Internal Medicine

## 2021-02-23 ENCOUNTER — Other Ambulatory Visit: Payer: Self-pay | Admitting: Nurse Practitioner

## 2021-02-23 DIAGNOSIS — N182 Chronic kidney disease, stage 2 (mild): Secondary | ICD-10-CM

## 2021-02-23 MED ORDER — RYBELSUS 14 MG PO TABS: 1.0000 | ORAL_TABLET | Freq: Every day | ORAL | 3 refills | Status: DC

## 2021-03-23 ENCOUNTER — Other Ambulatory Visit: Payer: Self-pay | Admitting: Internal Medicine

## 2021-03-23 ENCOUNTER — Other Ambulatory Visit: Payer: Self-pay | Admitting: Nurse Practitioner

## 2021-03-23 ENCOUNTER — Encounter: Payer: Self-pay | Admitting: Internal Medicine

## 2021-03-23 ENCOUNTER — Encounter: Payer: Managed Care, Other (non HMO) | Admitting: Adult Health Nurse Practitioner

## 2021-03-23 DIAGNOSIS — N182 Chronic kidney disease, stage 2 (mild): Secondary | ICD-10-CM

## 2021-03-23 DIAGNOSIS — E1122 Type 2 diabetes mellitus with diabetic chronic kidney disease: Secondary | ICD-10-CM

## 2021-03-23 MED ORDER — MOUNJARO 12.5 MG/0.5ML ~~LOC~~ SOAJ: 12.5000 mg | SUBCUTANEOUS | 2 refills | Status: DC

## 2021-03-23 NOTE — Telephone Encounter (Signed)
Please do prior auth for pt, changed to 12.5mg  of [facility]

## 2021-06-15 NOTE — Progress Notes (Signed)
COMPLETE PHYSICAL ? ?Assessment / Plan:  ? ?Encounter for routine medical examination with abnormal findings ?Yearly ?Mammogram scheduled ? ?Essential hypertension ?Continue current medications: lisinopril '40mg'$  and  HCTZ12.'5mg'$  daily ?Monitor blood pressure at home; call if consistently over 130/80 ?Continue DASH diet.   ?Reminder to go to the ER if any CP, SOB, nausea, dizziness, severe HA, changes vision/speech, left arm numbness and tingling and jaw pain. ?-     CBC with Differential/Platelet ?-     CMP ?-     TSH ? ?Mixed hyperlipidemia ?Continue medications: Atorvastatin '20mg'$  daily ?Discussed dietary and exercise modifications ?Low fat diet ?-     Lipid panel ? ?Fatty liver ?LONG discussion about cirrhosis and fatty liver ?-CMP ?  ?Type 2 diabetes mellitus with other diabetic kidney complication, without long-term current use of insulin ([place]) ?Discussed general issues about diabetes pathophysiology and management., Educational material distributed., Suggested low cholesterol diet., Encouraged aerobic exercise., Discussed foot care., Reminded to get yearly retinal exam. ?Continue Mounjaro 12.'5mg'$ , farxiga '10mg'$ , Metformin '500mg'$  two tablets BID. ?Follow up 3 months ?- A1c ? 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 ? ?Morbid Obesity (BMI) of 37.0 to 37.9 in adult [facility]) ?- follow up 3 months for progress monitoring ?- increase veggies, decrease carbs ?- long discussion about weight loss, diet, and exercise ?- bring food log- continue weight loss- goal is 175 ? ?Seasonal Allergies ?Advised to switch to generic Allegra to see if this helps more with symptoms ? ?Gastroesophageal reflux disease without esophagitis ?Doing well at this time ?Continue: omprazole '40mg'$  daily ?Diet discussed ?Monitor for triggers ?Avoid food with high acid content ?Avoid excessive  cafeine ?Increase water intake ? ?Vitamin D deficiency ?Continue supplementation to maintain goal of 70-100 ?Taking Vitamin D 5,000IU daily ?Vitamin D level ? ? ?Iron deficiency anemia, unspecified iron deficiency anemia type ?     No supplementation ? ?OBSTRUCTIVE SLEEP APNEA ?Continue CPAP/BiPAP, using nightly for at least 8 hours  ?Helping with daytime fatigue ?Weight loss still advised ?Discussed mask & tubing hygeine ? ?Medication management ?Magnesium ? ?Screening, ischemic heart disease ?-     EKG 12-Lead ? ?Screening for blood or protein in urine ?-     Routine UA with reflex microscopic ?-     Microalbumin / creatinine urine ratio ? ?Screening for thyroid disorder ?      - TSH ? ? ? ? ?Further disposition pending results if labs check today. Discussed med's effects and SE's.   ?Over 30 minutes of face to face interview, exam, counseling, chart review, and critical decision making was performed.  ? ?________________________________________________________________________________ ? ?HPI ?56 y.o. female  presents for 3 month follow up with hypertension, hyperlipidemia, diabetes and vitamin D. ? ?She reports that she is having seasonal allergies and taking zyrtec. Has been on Zyrtec for many years ? ? ? Her blood pressure has been controlled at home, Currently on Lisinopril 40 mg QD and HCTZ 12.5 mg QD. today their BP is BP: 130/82  ?BP Readings from Last 3 Encounters:  ?06/16/21 130/82  ?01/11/21 132/78  ?06/16/20 128/80  ?She does not workout. She denies chest pain, shortness of breath, dizziness.   ? ? ?BMI is Body mass index is 32.92 kg/m?., she is working on diet and exercise.She has had a lot of stress with her husband having had MI with 7 way bypass 2 years ago. She is  currently on Mounjaro 12.'5mg'$  SQ QW. 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  ?Wt Readings from Last 3 Encounters:  ?06/16/21 188 lb 12.8 oz (85.6 kg)  ?01/11/21 207 lb 9.6 oz (94.2 kg)  ?06/16/20 207 lb (93.9 kg)  ? ?She has been  working on diet and exercise for  ?diabetes with CKD on ACE/ARB ? she is not on bASA ?She is on  Mounjaro 12.'5mg'$  once a week ?Metformin '500mg'$   two tablets BID ?Farxiga '10mg'$  ? ? Denies foot ulcerations, hyperglycemia, hypoglycemia , increased appetite, nausea, paresthesia of the feet, polydipsia, polyuria, visual disturbances, vomiting and weight loss  ?Last A1C in the office was:   ?Lab Results  ?Component Value Date  ? HGBA1C 7.2 (H) 01/11/2021  ? ?Lab Results  ?Component Value Date  ? GFRNONAA 79 06/16/2020  ? ?Hyperlipidemia is at goal of less than 70- on lipitor '20mg'$  and vascepa( only taking 1 pill BID instead of 2) ?Lab Results  ?Component Value Date  ? CHOL 142 01/11/2021  ? HDL 42 (L) 01/11/2021  ? [place] 67 01/11/2021  ? TRIG 253 (H) 01/11/2021  ? CHOLHDL 3.4 01/11/2021  ? ? ?Patient is on Vitamin D supplement.  ?Lab Results  ?Component Value Date  ? VD25OH 52 01/11/2021  ?   ? ? ?Current Medications:  ? ?Current Outpatient Medications (Endocrine & Metabolic):  ?  dapagliflozin propanediol (FARXIGA) 10 MG TABS tablet, Take 1 tablet  Daily  for Diabetes ?  metFORMIN (GLUCOPHAGE-XR) 500 MG 24 hr tablet, Take  2 tablets  2 x /day  with Meals  for Diabetes ?  MOUNJARO 12.5 MG/0.5ML Pen, Inject 12.5 mg into the skin once a week. ? ?Current Outpatient Medications (Cardiovascular):  ?  atorvastatin (LIPITOR) 20 MG tablet, Take  1 tablet  Daily  for Cholesterol ?  hydrochlorothiazide (HYDRODIURIL) 12.5 MG tablet, Take 1 tablet Daily for BP & Fluid Retention /Ankle swelling ?  icosapent Ethyl (VASCEPA) 1 g capsule, Take  2 capsules  2 x /day  with Meals  for Triglycerides (Blood Fats) ?  lisinopril (ZESTRIL) 40 MG tablet, Take  1 tablet  Daily  for BP  / Patient knows to take by mouth ? ?Current Outpatient Medications (Respiratory):  ?  cetirizine (ZYRTEC) 10 MG chewable tablet, Chew 10 mg by mouth daily. ?  promethazine-dextromethorphan (PROMETHAZINE-DM) 6.25-15 MG/5ML syrup, Take 5 mLs by mouth 4 (four) times  daily as needed for cough. (Patient not taking: Reported on 06/16/2021) ? ?Current Outpatient Medications (Analgesics):  ?  aspirin EC 81 MG tablet, Take 81 mg by mouth daily. ? ? ?Current Outpatient Medications (Other):  ?  buPROPion (WELLBUTRIN XL) 150 MG 24 hr tablet, Take 1  tablet  every Morning  for Mood, Focus & Concentration ?  Cholecalciferol (VITAMIN D-3) 5000 UNITS TABS, Take by mouth daily. ?  Magnesium 500 MG CAPS, Take 1 capsule by mouth daily. Taking '950mg'$  daily ?  omeprazole (PRILOSEC) 40 MG capsule, TAKE ONE CAPSULE BY MOUTH ONE TIME DAILY TO PREVENT HEARTBURN AND INDIGESTION ? ?Medical History:  ?Past Medical History:  ?Diagnosis Date  ? Colitis 07/11/2012  ? Colon polyps 07/11/2012  ? TUBULOVILLOUS ADENOMA  ? Diabetes mellitus   ? Exercise-induced asthma   ? outside asthma   ? Hernia   ? Hyperlipidemia   ? Hypertension   ? Iron deficiency   ? Kidney stones   ? Obesity   ? OSA on CPAP   ? Vitamin D deficiency   ? ? ?  Allergies:  ?Allergies  ?Allergen Reactions  ? Welchol [Colesevelam Hcl] Hives  ?  HIves  ?  ? ?Review of Systems:  ? ?SURGICAL HISTORY ?She  has a past surgical history that includes Tonsilectomy, adenoidectomy, bilateral myringotomy and tubes; Ablation; Abdominoplasty; Esophagogastroduodenoscopy (07/11/2012); Colonoscopy w/ biopsies (07/11/2012); Hernia repair; and Colonoscopy. ?FAMILY HISTORY ?Her family history includes Colon cancer (age of onset: 87) in her father. She was adopted. ?SOCIAL HISTORY ?She  reports that she quit smoking about 36 years ago. Her smoking use included cigarettes. She has a 2.00 pack-year smoking history. She has never used smokeless tobacco. She reports current alcohol use. She reports that she does not use drugs. ? ? ?Names of Other Physician/Practitioners you currently use: ?1. [facility] here for primary care ?2.Eye Exam: 03/2021 ?3. Dental Exam Q77month, scheduled ? ?Patient Care Team: ?MUnk Pinto MD as PCP -  General (Internal Medicine) ?SLadene Artist MD as Consulting Physician (Gastroenterology) ?RRemer Macho MD as Attending Physician (Ophthalmology) ?ALenore Cordia MD (Obstetrics and Gynecology) ?RFrederik Pear

## 2021-06-16 ENCOUNTER — Encounter: Payer: Self-pay | Admitting: Internal Medicine

## 2021-06-16 ENCOUNTER — Other Ambulatory Visit: Payer: Self-pay | Admitting: Nurse Practitioner

## 2021-06-16 ENCOUNTER — Telehealth: Payer: Self-pay | Admitting: Internal Medicine

## 2021-06-16 ENCOUNTER — Other Ambulatory Visit: Payer: Self-pay

## 2021-06-16 ENCOUNTER — Ambulatory Visit (INDEPENDENT_AMBULATORY_CARE_PROVIDER_SITE_OTHER): Payer: Managed Care, Other (non HMO) | Admitting: Nurse Practitioner

## 2021-06-16 ENCOUNTER — Other Ambulatory Visit: Payer: Self-pay | Admitting: Internal Medicine

## 2021-06-16 ENCOUNTER — Encounter: Payer: Self-pay | Admitting: Nurse Practitioner

## 2021-06-16 VITALS — BP 130/82 | HR 75 | Temp 97.5°F | Ht 63.5 in | Wt 188.8 lb

## 2021-06-16 DIAGNOSIS — Z1231 Encounter for screening mammogram for malignant neoplasm of breast: Secondary | ICD-10-CM

## 2021-06-16 DIAGNOSIS — Z136 Encounter for screening for cardiovascular disorders: Secondary | ICD-10-CM

## 2021-06-16 DIAGNOSIS — Z131 Encounter for screening for diabetes mellitus: Secondary | ICD-10-CM

## 2021-06-16 DIAGNOSIS — Z1389 Encounter for screening for other disorder: Secondary | ICD-10-CM

## 2021-06-16 DIAGNOSIS — Z79899 Other long term (current) drug therapy: Secondary | ICD-10-CM | POA: Diagnosis not present

## 2021-06-16 DIAGNOSIS — Z13220 Encounter for screening for lipoid disorders: Secondary | ICD-10-CM | POA: Diagnosis not present

## 2021-06-16 DIAGNOSIS — D509 Iron deficiency anemia, unspecified: Secondary | ICD-10-CM

## 2021-06-16 DIAGNOSIS — Z0000 Encounter for general adult medical examination without abnormal findings: Secondary | ICD-10-CM

## 2021-06-16 DIAGNOSIS — Z0001 Encounter for general adult medical examination with abnormal findings: Secondary | ICD-10-CM

## 2021-06-16 DIAGNOSIS — E1122 Type 2 diabetes mellitus with diabetic chronic kidney disease: Secondary | ICD-10-CM

## 2021-06-16 DIAGNOSIS — E1169 Type 2 diabetes mellitus with other specified complication: Secondary | ICD-10-CM

## 2021-06-16 DIAGNOSIS — E559 Vitamin D deficiency, unspecified: Secondary | ICD-10-CM

## 2021-06-16 DIAGNOSIS — Z1329 Encounter for screening for other suspected endocrine disorder: Secondary | ICD-10-CM

## 2021-06-16 DIAGNOSIS — I10 Essential (primary) hypertension: Secondary | ICD-10-CM | POA: Diagnosis not present

## 2021-06-16 DIAGNOSIS — J302 Other seasonal allergic rhinitis: Secondary | ICD-10-CM

## 2021-06-16 DIAGNOSIS — G4733 Obstructive sleep apnea (adult) (pediatric): Secondary | ICD-10-CM

## 2021-06-16 DIAGNOSIS — K219 Gastro-esophageal reflux disease without esophagitis: Secondary | ICD-10-CM

## 2021-06-16 NOTE — Patient Instructions (Signed)

## 2021-06-16 NOTE — Telephone Encounter (Signed)
FAXED MEDICAL RECORD REQUEST to [facility] per [facility]. ?

## 2021-06-17 ENCOUNTER — Encounter: Payer: Self-pay | Admitting: Internal Medicine

## 2021-06-17 ENCOUNTER — Encounter: Payer: Self-pay | Admitting: Nurse Practitioner

## 2021-06-17 LAB — CBC WITH DIFFERENTIAL/PLATELET
Absolute Monocytes: 343 cells/uL (ref 200–950)
Basophils Absolute: 33 cells/uL (ref 0–200)
Basophils Relative: 0.5 %
Eosinophils Absolute: 271 cells/uL (ref 15–500)
Eosinophils Relative: 4.1 %
HCT: 46.4 % — ABNORMAL HIGH (ref 35.0–45.0)
Hemoglobin: 15.1 g/dL (ref 11.7–15.5)
Lymphs Abs: 2587 cells/uL (ref 850–3900)
MCH: 28.2 pg (ref 27.0–33.0)
MCHC: 32.5 g/dL (ref 32.0–36.0)
MCV: 86.7 fL (ref 80.0–100.0)
MPV: 9.5 fL (ref 7.5–12.5)
Monocytes Relative: 5.2 %
Neutro Abs: 3366 cells/uL (ref 1500–7800)
Neutrophils Relative %: 51 %
Platelets: 344 10*3/uL (ref 140–400)
RBC: 5.35 10*6/uL — ABNORMAL HIGH (ref 3.80–5.10)
RDW: 13.3 % (ref 11.0–15.0)
Total Lymphocyte: 39.2 %
WBC: 6.6 10*3/uL (ref 3.8–10.8)

## 2021-06-17 LAB — LIPID PANEL
Cholesterol: 134 mg/dL (ref <200)
HDL: 47 mg/dL — ABNORMAL LOW (ref >=50)
LDL Cholesterol (Calc): 64 mg/dL (calc)
Non-HDL Cholesterol (Calc): 87 mg/dL (calc) (ref <130)
Total CHOL/HDL Ratio: 2.9 (calc) (ref <5.0)
Triglycerides: 145 mg/dL (ref <150)

## 2021-06-17 LAB — URINALYSIS, ROUTINE W REFLEX MICROSCOPIC
Bilirubin Urine: NEGATIVE
Hgb urine dipstick: NEGATIVE
Leukocytes,Ua: NEGATIVE
Nitrite: NEGATIVE
Protein, ur: NEGATIVE
Specific Gravity, Urine: 1.033 (ref 1.001–1.035)
pH: <=5 (ref 5.0–8.0)

## 2021-06-17 LAB — COMPLETE METABOLIC PANEL WITH GFR
AG Ratio: 1.7 (calc) (ref 1.0–2.5)
ALT: 31 U/L — ABNORMAL HIGH (ref 6–29)
AST: 20 U/L (ref 10–35)
Albumin: 4.7 g/dL (ref 3.6–5.1)
Alkaline phosphatase (APISO): 56 U/L (ref 37–153)
BUN: 15 mg/dL (ref 7–25)
CO2: 30 mmol/L (ref 20–32)
Calcium: 10.7 mg/dL — ABNORMAL HIGH (ref 8.6–10.4)
Chloride: 102 mmol/L (ref 98–110)
Creat: 1.02 mg/dL (ref 0.50–1.03)
Globulin: 2.7 g/dL (calc) (ref 1.9–3.7)
Glucose, Bld: 95 mg/dL (ref 65–99)
Potassium: 5.1 mmol/L (ref 3.5–5.3)
Sodium: 145 mmol/L (ref 135–146)
Total Bilirubin: 0.9 mg/dL (ref 0.2–1.2)
Total Protein: 7.4 g/dL (ref 6.1–8.1)
eGFR: 65 mL/min/{1.73_m2} (ref >=60)

## 2021-06-17 LAB — MICROALBUMIN / CREATININE URINE RATIO
Creatinine, Urine: 225 mg/dL (ref 20–275)
Microalb Creat Ratio: 4 mcg/mg creat (ref <30)
Microalb, Ur: 0.9 mg/dL

## 2021-06-17 LAB — HEMOGLOBIN A1C
Hgb A1c MFr Bld: 5.9 % of total Hgb — ABNORMAL HIGH (ref <5.7)
Mean Plasma Glucose: 123 mg/dL
eAG (mmol/L): 6.8 mmol/L

## 2021-06-17 LAB — MAGNESIUM: Magnesium: 2.2 mg/dL (ref 1.5–2.5)

## 2021-06-17 LAB — TSH: TSH: 1.33 mIU/L

## 2021-06-17 LAB — VITAMIN D 25 HYDROXY (VIT D DEFICIENCY, FRACTURES): Vit D, 25-Hydroxy: 88 ng/mL (ref 30–100)

## 2021-06-17 NOTE — Telephone Encounter (Signed)
Can you see if she needs a prior Auth.  She is diabetic and has been on the [facility]

## 2021-06-17 NOTE — Telephone Encounter (Signed)
Can you look at this

## 2021-06-20 ENCOUNTER — Encounter: Payer: Self-pay | Admitting: Nurse Practitioner

## 2021-06-21 NOTE — Telephone Encounter (Signed)
Did you do a prior auth for this medication?

## 2021-06-24 ENCOUNTER — Telehealth: Payer: Self-pay

## 2021-06-24 NOTE — Telephone Encounter (Signed)
[place] prior auth approved. ?

## 2021-06-28 ENCOUNTER — Encounter: Payer: Self-pay | Admitting: Internal Medicine

## 2021-07-04 ENCOUNTER — Other Ambulatory Visit: Payer: Self-pay | Admitting: Internal Medicine

## 2021-07-04 DIAGNOSIS — F320 Major depressive disorder, single episode, mild: Secondary | ICD-10-CM

## 2021-07-04 DIAGNOSIS — I10 Essential (primary) hypertension: Secondary | ICD-10-CM

## 2021-07-04 DIAGNOSIS — E1122 Type 2 diabetes mellitus with diabetic chronic kidney disease: Secondary | ICD-10-CM

## 2021-07-04 DIAGNOSIS — E1169 Type 2 diabetes mellitus with other specified complication: Secondary | ICD-10-CM

## 2021-07-05 ENCOUNTER — Ambulatory Visit
Admission: RE | Admit: 2021-07-05 | Discharge: 2021-07-05 | Disposition: A | Discharge status: Home / Self Care | Payer: Managed Care, Other (non HMO) | Source: Ambulatory Visit | Attending: Internal Medicine | Admitting: Internal Medicine

## 2021-07-05 DIAGNOSIS — Z1231 Encounter for screening mammogram for malignant neoplasm of breast: Secondary | ICD-10-CM

## 2021-08-12 ENCOUNTER — Other Ambulatory Visit: Payer: Self-pay | Admitting: Nurse Practitioner

## 2021-08-12 ENCOUNTER — Encounter: Payer: Self-pay | Admitting: Adult Health

## 2021-08-12 DIAGNOSIS — E1122 Type 2 diabetes mellitus with diabetic chronic kidney disease: Secondary | ICD-10-CM

## 2021-08-17 ENCOUNTER — Other Ambulatory Visit: Payer: Self-pay | Admitting: Nurse Practitioner

## 2021-08-17 DIAGNOSIS — E1122 Type 2 diabetes mellitus with diabetic chronic kidney disease: Secondary | ICD-10-CM

## 2021-08-23 ENCOUNTER — Other Ambulatory Visit: Payer: Self-pay | Admitting: Nurse Practitioner

## 2021-08-23 DIAGNOSIS — E1122 Type 2 diabetes mellitus with diabetic chronic kidney disease: Secondary | ICD-10-CM

## 2021-08-23 MED ORDER — TIRZEPATIDE 15 MG/0.5ML ~~LOC~~ SOAJ: 15.0000 mg | SUBCUTANEOUS | 1 refills | Status: DC

## 2021-08-24 ENCOUNTER — Telehealth: Payer: Self-pay

## 2021-08-24 NOTE — Telephone Encounter (Signed)
Prior Auth for Mounjaro '15mg'$  approved through 08/24/22.

## 2021-09-23 NOTE — Progress Notes (Signed)
FOLLOW UP 3 MONTH  Assessment and Plan:    Essential hypertension Continue current medications: lisinopril '20mg'$   Monitor blood pressure at home; call if consistently over 130/80 Continue DASH diet.   Reminder to go to the ER if any CP, SOB, nausea, dizziness, severe HA, changes vision/speech, left arm numbness and tingling and jaw pain. -     CBC with Differential/Platelet -     CMP -     TSH  Mixed hyperlipidemia associated with Type 2 DM Continue medications: Atorvastatin '20mg'$  daily Discussed dietary and exercise modifications Low fat diet -     Lipid panel  Fatty liver Continue diet, exercise, weight loss and limit Tylenol/Alcohol Use -CMP   Type 2 diabetes mellitus with other diabetic kidney complication, without long-term current use of insulin ([place]) Discussed general issues about diabetes pathophysiology and management., Educational material distributed., Suggested low cholesterol diet., Encouraged aerobic exercise., Discussed foot care., Reminded to get yearly retinal exam.        freestyle libre Will increase  Mounjaro to '15mg'$ , farxiga '10mg'$ , Metformin '500mg'$  two tablets BID. Follow up 3 months - A1c  Class 2 severe obesity due to excess calories with serious comorbidity and body mass index (BMI) of 37.0 to 37.9 in adult [facility]) - follow up 3 months for progress monitoring - increase veggies, decrease carbs - long discussion about weight loss, diet, and exercise - bring food log- continue weight loss- goal is 175  Gastroesophageal reflux disease without esophagitis Doing well at this time Continue: omprazole '40mg'$  daily Diet discussed Monitor for triggers Avoid food with high acid content Avoid excessive cafeine Increase water intake  Vitamin D deficiency Continue supplementation to maintain goal of 70-100 Taking Vitamin D 5,000IU daily Defervitamin D level   OBSTRUCTIVE SLEEP APNEA Continue CPAP, using nightly for at least 8 hours  Helping with daytime  fatigue Weight loss still advised Discussed mask & tubing hygeine  Medication management Continued   Further disposition pending results if labs check today. Discussed med's effects and SE's.   Over 30 minutes of face to face interview, exam, counseling, chart review, and critical decision making was performed.    HPI 56 y.o. female  presents for 3 month follow up with hypertension, hyperlipidemia, diabetes and vitamin D.  She was last seen on 12/12/19 for three month follow up.  Reports she is getting over a sinus infection.  Reports that she still has sinus drainage at times.  She eneed up calling telehealth place as it would not go away with symptoms management.  She complete a course of amoxicillin and is feeling much better.    Her blood pressure has been controlled at home, today their BP is BP: 132/80  She is currently on lisinopril 40 mg QD and HCTZ 12.5 mg daily BP Readings from Last 3 Encounters:  09/27/21 132/80  06/16/21 130/82  01/11/21 132/78   She does not workout. She denies chest pain, shortness of breath, dizziness.    BMI is Body mass index is 32.54 kg/m., she is working on diet and exercise. She is currently on Mounjaro 12.5 mg . Highest weight was 270 Wt Readings from Last 3 Encounters:  09/27/21 186 lb 9.6 oz (84.6 kg)  06/16/21 188 lb 12.8 oz (85.6 kg)  01/11/21 207 lb 9.6 oz (94.2 kg)   She has been working on diet and exercise for  diabetes with CKD on ACE/ARB Hyperlipidemia NOT at goal of less than 70- on lipitor '20mg'$  and vascepa  she is  not on bASA She is on  rybelsus 14 mg since last visit Farxiga Metformin  Mounjaro 12.5 mg- would like to increase to 15  denies foot ulcerations, hyperglycemia, hypoglycemia , increased appetite, nausea, paresthesia of the feet, polydipsia, polyuria, visual disturbances, vomiting and weight loss  Last A1C in the office was:   Lab Results  Component Value Date   HGBA1C 5.9 (H) 06/16/2021   Lab Results   Component Value Date   GFRNONAA 79 06/16/2020   Lab Results  Component Value Date   CHOL 134 06/16/2021   HDL 47 (L) 06/16/2021   LDLCALC 64 06/16/2021   TRIG 145 06/16/2021   CHOLHDL 2.9 06/16/2021   She does have sleep apnea and uses her CPAP nightly  GERD symptoms are well controlled on Omeprazole 40 mg QD and diet modifications  Patient is on Vitamin D supplement.  Lab Results  Component Value Date   VD25OH 88 06/16/2021       Current Medications:   Current Outpatient Medications (Endocrine & Metabolic):    FARXIGA 10 MG TABS tablet, Take 1 tablet Daily for Diabetes   metFORMIN (GLUCOPHAGE-XR) 500 MG 24 hr tablet, Take  2 tablets 2 x /day with Meals for Diabetes                                    /                            TAKE                        BY                  MOUTH   tirzepatide (MOUNJARO) 15 MG/0.5ML Pen, Inject 15 mg into the skin once a week.  Current Outpatient Medications (Cardiovascular):    atorvastatin (LIPITOR) 20 MG tablet, Take 1 tablet Daily for Cholesterol   hydrochlorothiazide (HYDRODIURIL) 12.5 MG tablet, Take 1 tablet Daily for BP & Fluid Retention /Ankle swelling   icosapent Ethyl (VASCEPA) 1 g capsule, Take  2 capsules  2 x /day  with Meals  for Triglycerides (Blood Fats)   lisinopril (ZESTRIL) 40 MG tablet, Take  1 tablet  Daily for BP & Diabetic Kidney Protection                                         /                   TAKE                     BY                        MOUTH  Current Outpatient Medications (Respiratory):    cetirizine (ZYRTEC) 10 MG chewable tablet, Chew 10 mg by mouth daily.   promethazine-dextromethorphan (PROMETHAZINE-DM) 6.25-15 MG/5ML syrup, Take 5 mLs by mouth 4 (four) times daily as needed for cough. (Patient not taking: Reported on 06/16/2021)  Current Outpatient Medications (Analgesics):    aspirin EC 81 MG tablet, Take 81 mg by mouth daily.   Current Outpatient Medications (Other):    buPROPion (WELLBUTRIN  XL) 150 MG 24 hr  tablet, Take 1 tablet every Morning for Mood, Focus & Concentration   Cholecalciferol (VITAMIN D-3) 5000 UNITS TABS, Take by mouth daily.   Magnesium 500 MG CAPS, Take 1 capsule by mouth daily. Taking '950mg'$  daily   omeprazole (PRILOSEC) 40 MG capsule, TAKE ONE CAPSULE BY MOUTH ONE TIME DAILY TO PREVENT HEARTBURN AND INDIGESTION  Medical History:  Past Medical History:  Diagnosis Date   Colitis 07/11/2012   Colon polyps 07/11/2012   TUBULOVILLOUS ADENOMA   Diabetes mellitus    Exercise-induced asthma    outside asthma    Hernia    Hyperlipidemia    Hypertension    Iron deficiency    Kidney stones    Obesity    OSA on CPAP    Vitamin D deficiency     Allergies:  Allergies  Allergen Reactions   Welchol [Colesevelam Hcl] Hives    HIves     Review of Systems:  Review of Systems  Constitutional:  Negative for chills, fever and malaise/fatigue.  HENT:  Negative for congestion, ear pain and sore throat.   Eyes: Negative.   Respiratory:  Negative for cough, shortness of breath and wheezing.   Cardiovascular:  Negative for chest pain, palpitations and leg swelling.  Gastrointestinal:  Negative for abdominal pain, blood in stool, constipation, diarrhea, heartburn and melena.  Genitourinary: Negative.   Skin: Negative.   Neurological:  Negative for dizziness, sensory change, loss of consciousness and headaches.  Psychiatric/Behavioral:  Negative for depression. The patient is not nervous/anxious and does not have insomnia.     Family history- Review and unchanged  Social history- Review and unchanged  Physical Exam: BP 132/80   Pulse 75   Temp (!) 97.5 F (36.4 C)   Wt 186 lb 9.6 oz (84.6 kg)   SpO2 98%   BMI 32.54 kg/m  Wt Readings from Last 3 Encounters:  09/27/21 186 lb 9.6 oz (84.6 kg)  06/16/21 188 lb 12.8 oz (85.6 kg)  01/11/21 207 lb 9.6 oz (94.2 kg)    General Appearance: Morbidly obese, Well nourished well developed, in no apparent  distress. Eyes: PERRLA, EOMs, conjunctiva no swelling or erythema ENT/Mouth: Ear canals normal without obstruction, swelling, erythma, discharge.  TMs normal bilaterally.  Oropharynx moist, clear, without exudate, or postoropharyngeal swelling. Neck: Supple, thyroid normal,no cervical adenopathy  Respiratory: Respiratory effort normal, Breath sounds clear A&P without rhonchi, wheeze, or rale.  No retractions, no accessory usage. Cardio: RRR with no MRGs. Brisk peripheral pulses without edema.  Abdomen: Soft, + BS,  Non tender, no guarding, rebound, hernias, masses. Musculoskeletal: Full ROM, 5/5 strength, Normal gait Skin: Warm, dry without rashes, lesions, ecchymosis.  Neuro: Awake and oriented X 3, Cranial nerves intact. Normal muscle tone, no cerebellar symptoms. Psych: Normal affect, Insight and Judgment appropriate.     [facility]  09/27/2021

## 2021-09-27 ENCOUNTER — Encounter: Payer: Self-pay | Admitting: Nurse Practitioner

## 2021-09-27 ENCOUNTER — Ambulatory Visit (INDEPENDENT_AMBULATORY_CARE_PROVIDER_SITE_OTHER): Payer: Managed Care, Other (non HMO) | Admitting: Nurse Practitioner

## 2021-09-27 VITALS — BP 132/80 | HR 75 | Temp 97.5°F | Wt 186.6 lb

## 2021-09-27 DIAGNOSIS — E785 Hyperlipidemia, unspecified: Secondary | ICD-10-CM | POA: Diagnosis not present

## 2021-09-27 DIAGNOSIS — D509 Iron deficiency anemia, unspecified: Secondary | ICD-10-CM

## 2021-09-27 DIAGNOSIS — E1169 Type 2 diabetes mellitus with other specified complication: Secondary | ICD-10-CM | POA: Diagnosis not present

## 2021-09-27 DIAGNOSIS — I10 Essential (primary) hypertension: Secondary | ICD-10-CM | POA: Diagnosis not present

## 2021-09-27 DIAGNOSIS — E1122 Type 2 diabetes mellitus with diabetic chronic kidney disease: Secondary | ICD-10-CM | POA: Diagnosis not present

## 2021-09-27 DIAGNOSIS — N182 Chronic kidney disease, stage 2 (mild): Secondary | ICD-10-CM

## 2021-09-27 DIAGNOSIS — E559 Vitamin D deficiency, unspecified: Secondary | ICD-10-CM

## 2021-09-27 DIAGNOSIS — K760 Fatty (change of) liver, not elsewhere classified: Secondary | ICD-10-CM

## 2021-09-27 DIAGNOSIS — Z79899 Other long term (current) drug therapy: Secondary | ICD-10-CM

## 2021-09-27 DIAGNOSIS — K219 Gastro-esophageal reflux disease without esophagitis: Secondary | ICD-10-CM

## 2021-09-27 DIAGNOSIS — G4733 Obstructive sleep apnea (adult) (pediatric): Secondary | ICD-10-CM

## 2021-09-28 LAB — CBC WITH DIFFERENTIAL/PLATELET
Absolute Monocytes: 284 cells/uL (ref 200–950)
Basophils Absolute: 38 cells/uL (ref 0–200)
Basophils Relative: 0.6 %
Eosinophils Absolute: 82 cells/uL (ref 15–500)
Eosinophils Relative: 1.3 %
HCT: 42.3 % (ref 35.0–45.0)
Hemoglobin: 14.3 g/dL (ref 11.7–15.5)
Lymphs Abs: 2545 cells/uL (ref 850–3900)
MCH: 28 pg (ref 27.0–33.0)
MCHC: 33.8 g/dL (ref 32.0–36.0)
MCV: 82.8 fL (ref 80.0–100.0)
MPV: 9.5 fL (ref 7.5–12.5)
Monocytes Relative: 4.5 %
Neutro Abs: 3352 cells/uL (ref 1500–7800)
Neutrophils Relative %: 53.2 %
Platelets: 339 10*3/uL (ref 140–400)
RBC: 5.11 10*6/uL — ABNORMAL HIGH (ref 3.80–5.10)
RDW: 12.9 % (ref 11.0–15.0)
Total Lymphocyte: 40.4 %
WBC: 6.3 10*3/uL (ref 3.8–10.8)

## 2021-09-28 LAB — COMPLETE METABOLIC PANEL WITH GFR
AG Ratio: 1.9 (calc) (ref 1.0–2.5)
ALT: 27 U/L (ref 6–29)
AST: 18 U/L (ref 10–35)
Albumin: 4.7 g/dL (ref 3.6–5.1)
Alkaline phosphatase (APISO): 55 U/L (ref 37–153)
BUN: 11 mg/dL (ref 7–25)
CO2: 29 mmol/L (ref 20–32)
Calcium: 10.2 mg/dL (ref 8.6–10.4)
Chloride: 103 mmol/L (ref 98–110)
Creat: 0.88 mg/dL (ref 0.50–1.03)
Globulin: 2.5 g/dL (calc) (ref 1.9–3.7)
Glucose, Bld: 80 mg/dL (ref 65–99)
Potassium: 4.6 mmol/L (ref 3.5–5.3)
Sodium: 142 mmol/L (ref 135–146)
Total Bilirubin: 0.6 mg/dL (ref 0.2–1.2)
Total Protein: 7.2 g/dL (ref 6.1–8.1)
eGFR: 78 mL/min/{1.73_m2} (ref >=60)

## 2021-09-28 LAB — LIPID PANEL
Cholesterol: 122 mg/dL (ref <200)
HDL: 47 mg/dL — ABNORMAL LOW (ref >=50)
LDL Cholesterol (Calc): 54 mg/dL (calc)
Non-HDL Cholesterol (Calc): 75 mg/dL (calc) (ref <130)
Total CHOL/HDL Ratio: 2.6 (calc) (ref <5.0)
Triglycerides: 127 mg/dL (ref <150)

## 2021-09-28 LAB — HEMOGLOBIN A1C
Hgb A1c MFr Bld: 5.7 % of total Hgb — ABNORMAL HIGH (ref <5.7)
Mean Plasma Glucose: 117 mg/dL
eAG (mmol/L): 6.5 mmol/L

## 2021-09-28 LAB — TSH: TSH: 1.16 mIU/L

## 2021-10-03 ENCOUNTER — Other Ambulatory Visit: Payer: Self-pay | Admitting: Internal Medicine

## 2021-10-15 ENCOUNTER — Other Ambulatory Visit: Payer: Self-pay | Admitting: Nurse Practitioner

## 2021-10-15 DIAGNOSIS — E1122 Type 2 diabetes mellitus with diabetic chronic kidney disease: Secondary | ICD-10-CM

## 2021-10-21 ENCOUNTER — Other Ambulatory Visit: Payer: Self-pay | Admitting: Nurse Practitioner

## 2021-10-21 DIAGNOSIS — E1122 Type 2 diabetes mellitus with diabetic chronic kidney disease: Secondary | ICD-10-CM

## 2021-11-18 ENCOUNTER — Other Ambulatory Visit: Payer: Self-pay | Admitting: Nurse Practitioner

## 2021-11-18 ENCOUNTER — Encounter: Payer: Self-pay | Admitting: Nurse Practitioner

## 2021-11-18 DIAGNOSIS — E1122 Type 2 diabetes mellitus with diabetic chronic kidney disease: Secondary | ICD-10-CM

## 2021-11-18 MED ORDER — MOUNJARO 12.5 MG/0.5ML ~~LOC~~ SOAJ: 12.5000 mg | SUBCUTANEOUS | 3 refills | Status: DC

## 2021-12-09 ENCOUNTER — Other Ambulatory Visit: Payer: Self-pay | Admitting: Internal Medicine

## 2021-12-09 DIAGNOSIS — K219 Gastro-esophageal reflux disease without esophagitis: Secondary | ICD-10-CM

## 2022-01-02 NOTE — Progress Notes (Unsigned)
FOLLOW UP 6 MONTH  Assessment and Plan:    Essential hypertension Continue current medications: lisinopril '20mg'$   Monitor blood pressure at home; call if consistently over 130/80 Continue DASH diet.   Reminder to go to the ER if any CP, SOB, nausea, dizziness, severe HA, changes vision/speech, left arm numbness and tingling and jaw pain. -     CBC with Differential/Platelet -     CMP -     TSH  Mixed hyperlipidemia associated with Type 2 DM Continue medications: Atorvastatin '20mg'$  daily Discussed dietary and exercise modifications Low fat diet -     Lipid panel  Fatty liver Continue diet, exercise, weight loss and limit Tylenol/Alcohol Use -CMP   Type 2 diabetes mellitus with other diabetic kidney complication, without long-term current use of insulin ([place]) Discussed general issues about diabetes pathophysiology and management., Educational material distributed., Suggested low cholesterol diet., Encouraged aerobic exercise., Discussed foot care., Reminded to get yearly retinal exam.        freestyle libre Will increase  Mounjaro to '15mg'$ , farxiga '10mg'$ , Metformin '500mg'$  two tablets BID. Follow up 3 months - A1c  Class 2 severe obesity due to excess calories with serious comorbidity and body mass index (BMI) of 37.0 to 37.9 in adult [facility]) - follow up 3 months for progress monitoring - increase veggies, decrease carbs - long discussion about weight loss, diet, and exercise - bring food log- continue weight loss- goal is 175  Gastroesophageal reflux disease without esophagitis Doing well at this time Continue: omprazole '40mg'$  daily Diet discussed Monitor for triggers Avoid food with high acid content Avoid excessive cafeine Increase water intake  Vitamin D deficiency Continue supplementation to maintain goal of 70-100 Taking Vitamin D 5,000IU daily Defervitamin D level   OBSTRUCTIVE SLEEP APNEA Continue CPAP, using nightly for at least 8 hours  Helping with daytime  fatigue Weight loss still advised Discussed mask & tubing hygeine  Medication management Continued   Further disposition pending results if labs check today. Discussed med's effects and SE's.   Over 30 minutes of face to face interview, exam, counseling, chart review, and critical decision making was performed.    HPI 56 y.o. female  presents for 3 month follow up with hypertension, hyperlipidemia, diabetes and vitamin D.  She was last seen on 12/12/19 for three month follow up.  Reports she is getting over a sinus infection.  Reports that she still has sinus drainage at times.  She eneed up calling telehealth place as it would not go away with symptoms management.  She complete a course of amoxicillin and is feeling much better.    Her blood pressure has been controlled at home, today their BP is    She is currently on lisinopril 40 mg QD and HCTZ 12.5 mg daily BP Readings from Last 3 Encounters:  09/27/21 132/80  06/16/21 130/82  01/11/21 132/78   She does not workout. She denies chest pain, shortness of breath, dizziness.    BMI is There is no height or weight on file to calculate BMI., she is working on diet and exercise. She is currently on Mounjaro 12.5 mg . Highest weight was 270 Wt Readings from Last 3 Encounters:  09/27/21 186 lb 9.6 oz (84.6 kg)  06/16/21 188 lb 12.8 oz (85.6 kg)  01/11/21 207 lb 9.6 oz (94.2 kg)   She has been working on diet and exercise for  diabetes with CKD on ACE/ARB Hyperlipidemia NOT at goal of less than 70- on lipitor '20mg'$   and vascepa  she is not on bASA She is on  rybelsus 14 mg since last visit Farxiga Metformin  Mounjaro 12.5 mg- would like to increase to 15  denies foot ulcerations, hyperglycemia, hypoglycemia , increased appetite, nausea, paresthesia of the feet, polydipsia, polyuria, visual disturbances, vomiting and weight loss  Last A1C in the office was:   Lab Results  Component Value Date   HGBA1C 5.7 (H) 09/27/2021   Lab  Results  Component Value Date   GFRNONAA 79 06/16/2020   Lab Results  Component Value Date   CHOL 122 09/27/2021   HDL 47 (L) 09/27/2021   LDLCALC 54 09/27/2021   TRIG 127 09/27/2021   CHOLHDL 2.6 09/27/2021   She does have sleep apnea and uses her CPAP nightly  GERD symptoms are well controlled on Omeprazole 40 mg QD and diet modifications  Patient is on Vitamin D supplement.  Lab Results  Component Value Date   VD25OH 88 06/16/2021       Current Medications:   Current Outpatient Medications (Endocrine & Metabolic):    FARXIGA 10 MG TABS tablet, Take 1 tablet Daily for Diabetes   metFORMIN (GLUCOPHAGE-XR) 500 MG 24 hr tablet, Take  2 tablets 2 x /day with Meals for Diabetes                                    /                            TAKE                        BY                  MOUTH   tirzepatide (MOUNJARO) 12.5 MG/0.5ML Pen, Inject 12.5 mg into the skin once a week.  Current Outpatient Medications (Cardiovascular):    atorvastatin (LIPITOR) 20 MG tablet, Take 1 tablet Daily for Cholesterol   hydrochlorothiazide (HYDRODIURIL) 12.5 MG tablet, Take 1 tablet Daily for BP & Fluid Retention /Ankle swelling   icosapent Ethyl (VASCEPA) 1 g capsule, Take 2 capsules 2 x /day with Meals for Triglycerides (Blood Fats)   lisinopril (ZESTRIL) 40 MG tablet, Take  1 tablet  Daily for BP & Diabetic Kidney Protection                                         /                   TAKE                     BY                        MOUTH  Current Outpatient Medications (Respiratory):    cetirizine (ZYRTEC) 10 MG chewable tablet, Chew 10 mg by mouth daily.   promethazine-dextromethorphan (PROMETHAZINE-DM) 6.25-15 MG/5ML syrup, Take 5 mLs by mouth 4 (four) times daily as needed for cough. (Patient not taking: Reported on 06/16/2021)  Current Outpatient Medications (Analgesics):    aspirin EC 81 MG tablet, Take 81 mg by mouth daily.   Current Outpatient Medications (Other):    buPROPion  (WELLBUTRIN XL) 150 MG 24  hr tablet, Take 1 tablet every Morning for Mood, Focus & Concentration   Cholecalciferol (VITAMIN D-3) 5000 UNITS TABS, Take by mouth daily.   Magnesium 500 MG CAPS, Take 1 capsule by mouth daily. Taking '950mg'$  daily   omeprazole (PRILOSEC) 40 MG capsule, Take 1 capsule by mouth Daily to Prevent Heartburn & Indigestion  Medical History:  Past Medical History:  Diagnosis Date   Colitis 07/11/2012   Colon polyps 07/11/2012   TUBULOVILLOUS ADENOMA   Diabetes mellitus    Exercise-induced asthma    outside asthma    Hernia    Hyperlipidemia    Hypertension    Iron deficiency    Kidney stones    Obesity    OSA on CPAP    Vitamin D deficiency     Allergies:  Allergies  Allergen Reactions   Welchol [Colesevelam Hcl] Hives    HIves     Review of Systems:  Review of Systems  Constitutional:  Negative for chills, fever and malaise/fatigue.  HENT:  Negative for congestion, ear pain and sore throat.   Eyes: Negative.   Respiratory:  Negative for cough, shortness of breath and wheezing.   Cardiovascular:  Negative for chest pain, palpitations and leg swelling.  Gastrointestinal:  Negative for abdominal pain, blood in stool, constipation, diarrhea, heartburn and melena.  Genitourinary: Negative.   Skin: Negative.   Neurological:  Negative for dizziness, sensory change, loss of consciousness and headaches.  Psychiatric/Behavioral:  Negative for depression. The patient is not nervous/anxious and does not have insomnia.     Family history- Review and unchanged  Social history- Review and unchanged  Physical Exam: There were no vitals taken for this visit. Wt Readings from Last 3 Encounters:  09/27/21 186 lb 9.6 oz (84.6 kg)  06/16/21 188 lb 12.8 oz (85.6 kg)  01/11/21 207 lb 9.6 oz (94.2 kg)    General Appearance: Morbidly obese, Well nourished well developed, in no apparent distress. Eyes: PERRLA, EOMs, conjunctiva no swelling or erythema ENT/Mouth:  Ear canals normal without obstruction, swelling, erythma, discharge.  TMs normal bilaterally.  Oropharynx moist, clear, without exudate, or postoropharyngeal swelling. Neck: Supple, thyroid normal,no cervical adenopathy  Respiratory: Respiratory effort normal, Breath sounds clear A&P without rhonchi, wheeze, or rale.  No retractions, no accessory usage. Cardio: RRR with no MRGs. Brisk peripheral pulses without edema.  Abdomen: Soft, + BS,  Non tender, no guarding, rebound, hernias, masses. Musculoskeletal: Full ROM, 5/5 strength, Normal gait Skin: Warm, dry without rashes, lesions, ecchymosis.  Neuro: Awake and oriented X 3, Cranial nerves intact. Normal muscle tone, no cerebellar symptoms. Psych: Normal affect, Insight and Judgment appropriate.     [facility]  01/02/2022

## 2022-01-03 ENCOUNTER — Encounter: Payer: Self-pay | Admitting: Nurse Practitioner

## 2022-01-03 ENCOUNTER — Ambulatory Visit: Payer: Managed Care, Other (non HMO) | Admitting: Nurse Practitioner

## 2022-01-03 VITALS — BP 118/82 | HR 66 | Temp 97.9°F | Resp 17 | Ht 63.5 in | Wt 183.2 lb

## 2022-01-03 DIAGNOSIS — E1169 Type 2 diabetes mellitus with other specified complication: Secondary | ICD-10-CM | POA: Diagnosis not present

## 2022-01-03 DIAGNOSIS — E785 Hyperlipidemia, unspecified: Secondary | ICD-10-CM

## 2022-01-03 DIAGNOSIS — I10 Essential (primary) hypertension: Secondary | ICD-10-CM

## 2022-01-03 DIAGNOSIS — N182 Chronic kidney disease, stage 2 (mild): Secondary | ICD-10-CM

## 2022-01-03 DIAGNOSIS — G4733 Obstructive sleep apnea (adult) (pediatric): Secondary | ICD-10-CM

## 2022-01-03 DIAGNOSIS — E1122 Type 2 diabetes mellitus with diabetic chronic kidney disease: Secondary | ICD-10-CM | POA: Diagnosis not present

## 2022-01-03 DIAGNOSIS — E559 Vitamin D deficiency, unspecified: Secondary | ICD-10-CM

## 2022-01-03 DIAGNOSIS — Z79899 Other long term (current) drug therapy: Secondary | ICD-10-CM

## 2022-01-03 DIAGNOSIS — K219 Gastro-esophageal reflux disease without esophagitis: Secondary | ICD-10-CM

## 2022-01-03 NOTE — Patient Instructions (Signed)

## 2022-01-04 ENCOUNTER — Encounter: Payer: Self-pay | Admitting: Nurse Practitioner

## 2022-01-04 LAB — CBC WITH DIFFERENTIAL/PLATELET
Absolute Monocytes: 288 cells/uL (ref 200–950)
Basophils Absolute: 51 cells/uL (ref 0–200)
Basophils Relative: 0.8 %
Eosinophils Absolute: 147 cells/uL (ref 15–500)
Eosinophils Relative: 2.3 %
HCT: 43.5 % (ref 35.0–45.0)
Hemoglobin: 14.2 g/dL (ref 11.7–15.5)
Lymphs Abs: 2586 cells/uL (ref 850–3900)
MCH: 28 pg (ref 27.0–33.0)
MCHC: 32.6 g/dL (ref 32.0–36.0)
MCV: 85.8 fL (ref 80.0–100.0)
MPV: 9.7 fL (ref 7.5–12.5)
Monocytes Relative: 4.5 %
Neutro Abs: 3328 cells/uL (ref 1500–7800)
Neutrophils Relative %: 52 %
Platelets: 333 10*3/uL (ref 140–400)
RBC: 5.07 10*6/uL (ref 3.80–5.10)
RDW: 13.8 % (ref 11.0–15.0)
Total Lymphocyte: 40.4 %
WBC: 6.4 10*3/uL (ref 3.8–10.8)

## 2022-01-04 LAB — COMPLETE METABOLIC PANEL WITH GFR
AG Ratio: 1.8 (calc) (ref 1.0–2.5)
ALT: 21 U/L (ref 6–29)
AST: 16 U/L (ref 10–35)
Albumin: 4.3 g/dL (ref 3.6–5.1)
Alkaline phosphatase (APISO): 52 U/L (ref 37–153)
BUN: 14 mg/dL (ref 7–25)
CO2: 29 mmol/L (ref 20–32)
Calcium: 10.1 mg/dL (ref 8.6–10.4)
Chloride: 103 mmol/L (ref 98–110)
Creat: 0.92 mg/dL (ref 0.50–1.03)
Globulin: 2.4 g/dL (calc) (ref 1.9–3.7)
Glucose, Bld: 87 mg/dL (ref 65–99)
Potassium: 4.5 mmol/L (ref 3.5–5.3)
Sodium: 143 mmol/L (ref 135–146)
Total Bilirubin: 0.6 mg/dL (ref 0.2–1.2)
Total Protein: 6.7 g/dL (ref 6.1–8.1)
eGFR: 73 mL/min/{1.73_m2} (ref >=60)

## 2022-01-04 LAB — LIPID PANEL
Cholesterol: 142 mg/dL (ref <200)
HDL: 48 mg/dL — ABNORMAL LOW (ref >=50)
LDL Cholesterol (Calc): 72 mg/dL (calc)
Non-HDL Cholesterol (Calc): 94 mg/dL (calc) (ref <130)
Total CHOL/HDL Ratio: 3 (calc) (ref <5.0)
Triglycerides: 140 mg/dL (ref <150)

## 2022-01-04 LAB — HEMOGLOBIN A1C
Hgb A1c MFr Bld: 5.9 % of total Hgb — ABNORMAL HIGH (ref <5.7)
Mean Plasma Glucose: 123 mg/dL
eAG (mmol/L): 6.8 mmol/L

## 2022-01-04 LAB — MAGNESIUM: Magnesium: 2.2 mg/dL (ref 1.5–2.5)

## 2022-02-26 ENCOUNTER — Other Ambulatory Visit: Payer: Self-pay | Admitting: Nurse Practitioner

## 2022-02-26 DIAGNOSIS — E1122 Type 2 diabetes mellitus with diabetic chronic kidney disease: Secondary | ICD-10-CM

## 2022-02-28 ENCOUNTER — Encounter: Payer: Self-pay | Admitting: Nurse Practitioner

## 2022-03-09 ENCOUNTER — Encounter: Payer: Self-pay | Admitting: Nurse Practitioner

## 2022-04-07 NOTE — Progress Notes (Signed)
FOLLOW UP 3 MONTH  Assessment and Plan:    Essential hypertension Continue current medications: lisinopril '20mg'$   Monitor blood pressure at home; call if consistently over 130/80 Continue DASH diet.   Reminder to go to the ER if any CP, SOB, nausea, dizziness, severe HA, changes vision/speech, left arm numbness and tingling and jaw pain. -     CBC with Differential/Platelet -     CMP   Mixed hyperlipidemia associated with Type 2 DM Continue medications: Atorvastatin '20mg'$  daily, Vascepa 1g 1 caps BID Discussed dietary and exercise modifications Low fat diet -     Lipid panel  Fatty liver Continue diet, exercise, weight loss and limit Tylenol/Alcohol Use -CMP   Type 2 diabetes mellitus with other diabetic kidney complication, without long-term current use of insulin ([place]) Discussed general issues about diabetes pathophysiology and management., Educational material distributed., Suggested low cholesterol diet., Encouraged aerobic exercise., Discussed foot care., Reminded to get yearly retinal exam.        freestyle libre Continue Mounjaro 12.'5mg'$ , farxiga '10mg'$ , Metformin '500mg'$  two tablets BID. Follow up 3 months - A1c  Class 2 severe obesity due to excess calories with serious comorbidity and body mass index (BMI) of 37.0 to 37.9 in adult [facility]) - follow up 3 months for progress monitoring - increase veggies, decrease carbs - long discussion about weight loss, diet, and exercise - bring food log- continue weight loss- goal is 175  Gastroesophageal reflux disease without esophagitis Doing well at this time Continue: omprazole '40mg'$  daily Diet discussed Monitor for triggers Avoid food with high acid content Avoid excessive cafeine Increase water intake -magnesium  Vitamin D deficiency Continue supplementation to maintain goal of 70-100 Taking Vitamin D 5,000IU daily Defer vitamin D level   OBSTRUCTIVE SLEEP APNEA Continue CPAP, using nightly for at least 8 hours  Will  order another sleep study to see if still required  has lost over 100 pounds  Medication management Magnesium   Further disposition pending results if labs check today. Discussed med's effects and SE's.   Over 30 minutes of face to face interview, exam, counseling, chart review, and critical decision making was performed.    HPI 57 y.o. female  presents for 3 month follow up with hypertension, hyperlipidemia, diabetes and vitamin D.  She was last seen on 12/12/19 for three month follow up.    Her blood pressure has been controlled at home, today their BP is BP: 124/88  She is currently on lisinopril 20 mg QD and HCTZ 12.5 mg daily BP Readings from Last 3 Encounters:  04/11/22 124/88  01/03/22 118/82  09/27/21 132/80   She does not workout. She denies chest pain, shortness of breath, dizziness.    BMI is Body mass index is 29.61 kg/m., she is working on diet and exercise. She is currently on Mounjaro 12.5 mg . Highest weight was 270. Just got back from cruise for 10 days.  Wt Readings from Last 3 Encounters:  04/11/22 169 lb 12.8 oz (77 kg)  01/03/22 183 lb 3.2 oz (83.1 kg)  09/27/21 186 lb 9.6 oz (84.6 kg)   She has been working on diet and exercise for  diabetes with CKD on ACE/ARB Hyperlipidemia is at goal of less than 70- on lipitor '20mg'$  and vascepa  she is on bASA Farxiga Metformin 500 mg 2 tabs BID Mounjaro 12.5 mg  denies foot ulcerations, hyperglycemia, hypoglycemia , increased appetite, nausea, paresthesia of the feet, polydipsia, polyuria, visual disturbances, vomiting and weight loss  Last  A1C in the office was:   Lab Results  Component Value Date   HGBA1C 5.9 (H) 01/03/2022   Drinking lots of water Lab Results  Component Value Date   EGFR 73 01/03/2022   She is currently on Lipitor 20 mg daily. Her cholesterol is at goal. Lab Results  Component Value Date   CHOL 142 01/03/2022   HDL 48 (L) 01/03/2022   LDLCALC 72 01/03/2022   TRIG 140 01/03/2022    CHOLHDL 3.0 01/03/2022   She does have sleep apnea and uses her CPAP nightly  GERD symptoms are well controlled on Omeprazole 40 mg QD and diet modifications  Patient is on Vitamin D supplement.  Lab Results  Component Value Date   VD25OH 88 06/16/2021       Current Medications:   Current Outpatient Medications (Endocrine & Metabolic):    FARXIGA 10 MG TABS tablet, Take 1 tablet Daily for Diabetes   metFORMIN (GLUCOPHAGE-XR) 500 MG 24 hr tablet, Take  2 tablets 2 x /day with Meals for Diabetes                                    /                            TAKE                        BY                  MOUTH   MOUNJARO 12.5 MG/0.5ML Pen, INJECT 12.5 MG UNDER THE SKIN ONE DAY A WEEK  Current Outpatient Medications (Cardiovascular):    atorvastatin (LIPITOR) 20 MG tablet, Take 1 tablet Daily for Cholesterol   hydrochlorothiazide (HYDRODIURIL) 12.5 MG tablet, Take 1 tablet Daily for BP & Fluid Retention /Ankle swelling   icosapent Ethyl (VASCEPA) 1 g capsule, Take 2 capsules 2 x /day with Meals for Triglycerides (Blood Fats)   lisinopril (ZESTRIL) 40 MG tablet, Take  1 tablet  Daily for BP & Diabetic Kidney Protection                                         /                   TAKE                     BY                        MOUTH  Current Outpatient Medications (Respiratory):    Fexofenadine HCl (ALLEGRA PO), Take by mouth.   cetirizine (ZYRTEC) 10 MG chewable tablet, Chew 10 mg by mouth daily. (Patient not taking: Reported on 01/03/2022)   promethazine-dextromethorphan (PROMETHAZINE-DM) 6.25-15 MG/5ML syrup, Take 5 mLs by mouth 4 (four) times daily as needed for cough. (Patient not taking: Reported on 06/16/2021)  Current Outpatient Medications (Analgesics):    aspirin EC 81 MG tablet, Take 81 mg by mouth daily.   Current Outpatient Medications (Other):    buPROPion (WELLBUTRIN XL) 150 MG 24 hr tablet, Take 1 tablet every Morning for Mood, Focus & Concentration   Cholecalciferol  (VITAMIN D-3) 5000 UNITS TABS, Take by  mouth daily. 10,000 units   Magnesium 500 MG CAPS, Take 1 capsule by mouth daily. Taking '950mg'$  daily   omeprazole (PRILOSEC) 40 MG capsule, Take 1 capsule by mouth Daily to Prevent Heartburn & Indigestion  Medical History:  Past Medical History:  Diagnosis Date   Colitis 07/11/2012   Colon polyps 07/11/2012   TUBULOVILLOUS ADENOMA   Diabetes mellitus    Exercise-induced asthma    outside asthma    Hernia    Hyperlipidemia    Hypertension    Iron deficiency    Kidney stones    Obesity    OSA on CPAP    Vitamin D deficiency     Allergies:  Allergies  Allergen Reactions   Welchol [Colesevelam Hcl] Hives    HIves     Review of Systems:  Review of Systems  Constitutional:  Negative for chills, fever and malaise/fatigue.  HENT:  Negative for congestion, ear pain and sore throat.   Eyes: Negative.   Respiratory:  Negative for cough, shortness of breath and wheezing.        No longer snoring  Cardiovascular:  Negative for chest pain, palpitations and leg swelling.  Gastrointestinal:  Negative for abdominal pain, blood in stool, constipation, diarrhea, heartburn and melena.  Genitourinary: Negative.   Skin: Negative.   Neurological:  Negative for dizziness, sensory change, loss of consciousness and headaches.  Psychiatric/Behavioral:  Negative for depression. The patient is not nervous/anxious and does not have insomnia.     Family history- Review and unchanged  Social history- Review and unchanged  Physical Exam: BP 124/88   Pulse 74   Temp 97.9 F (36.6 C)   Ht 5' 3.5" (1.613 m)   Wt 169 lb 12.8 oz (77 kg)   SpO2 98%   BMI 29.61 kg/m  Wt Readings from Last 3 Encounters:  04/11/22 169 lb 12.8 oz (77 kg)  01/03/22 183 lb 3.2 oz (83.1 kg)  09/27/21 186 lb 9.6 oz (84.6 kg)    General Appearance: Morbidly obese, Well nourished well developed, in no apparent distress. Eyes: PERRLA, EOMs, conjunctiva no swelling or  erythema ENT/Mouth: Ear canals normal without obstruction, swelling, erythma, discharge.  TMs normal bilaterally.  Oropharynx moist, clear, without exudate, or postoropharyngeal swelling. Neck: Supple, thyroid normal,no cervical adenopathy  Respiratory: Respiratory effort normal, Breath sounds clear A&P without rhonchi, wheeze, or rale.  No retractions, no accessory usage. Cardio: RRR with no MRGs. Brisk peripheral pulses without edema.  Abdomen: Soft, + BS,  Non tender, no guarding, rebound, hernias, masses. Musculoskeletal: Full ROM, 5/5 strength, Normal gait Skin: Warm, dry without rashes, lesions, ecchymosis.  Neuro: Awake and oriented X 3, Cranial nerves intact. Normal muscle tone, no cerebellar symptoms. Psych: Normal affect, Insight and Judgment appropriate.     [facility]  04/11/2022

## 2022-04-11 ENCOUNTER — Encounter: Payer: Self-pay | Admitting: Nurse Practitioner

## 2022-04-11 ENCOUNTER — Ambulatory Visit: Payer: Managed Care, Other (non HMO) | Admitting: Nurse Practitioner

## 2022-04-11 VITALS — BP 124/88 | HR 74 | Temp 97.9°F | Ht 63.5 in | Wt 169.8 lb

## 2022-04-11 DIAGNOSIS — N182 Chronic kidney disease, stage 2 (mild): Secondary | ICD-10-CM

## 2022-04-11 DIAGNOSIS — Z79899 Other long term (current) drug therapy: Secondary | ICD-10-CM

## 2022-04-11 DIAGNOSIS — E1122 Type 2 diabetes mellitus with diabetic chronic kidney disease: Secondary | ICD-10-CM | POA: Diagnosis not present

## 2022-04-11 DIAGNOSIS — K219 Gastro-esophageal reflux disease without esophagitis: Secondary | ICD-10-CM

## 2022-04-11 DIAGNOSIS — E785 Hyperlipidemia, unspecified: Secondary | ICD-10-CM | POA: Diagnosis not present

## 2022-04-11 DIAGNOSIS — E1169 Type 2 diabetes mellitus with other specified complication: Secondary | ICD-10-CM

## 2022-04-11 DIAGNOSIS — I10 Essential (primary) hypertension: Secondary | ICD-10-CM

## 2022-04-11 DIAGNOSIS — K760 Fatty (change of) liver, not elsewhere classified: Secondary | ICD-10-CM

## 2022-04-11 DIAGNOSIS — E559 Vitamin D deficiency, unspecified: Secondary | ICD-10-CM

## 2022-04-11 DIAGNOSIS — G4733 Obstructive sleep apnea (adult) (pediatric): Secondary | ICD-10-CM

## 2022-04-11 NOTE — Patient Instructions (Signed)

## 2022-04-12 ENCOUNTER — Encounter: Payer: Self-pay | Admitting: Nurse Practitioner

## 2022-04-12 LAB — COMPLETE METABOLIC PANEL WITH GFR
AG Ratio: 1.9 (calc) (ref 1.0–2.5)
ALT: 19 U/L (ref 6–29)
AST: 15 U/L (ref 10–35)
Albumin: 4.5 g/dL (ref 3.6–5.1)
Alkaline phosphatase (APISO): 45 U/L (ref 37–153)
BUN: 16 mg/dL (ref 7–25)
CO2: 28 mmol/L (ref 20–32)
Calcium: 10.5 mg/dL — ABNORMAL HIGH (ref 8.6–10.4)
Chloride: 104 mmol/L (ref 98–110)
Creat: 0.82 mg/dL (ref 0.50–1.03)
Globulin: 2.4 g/dL (calc) (ref 1.9–3.7)
Glucose, Bld: 91 mg/dL (ref 65–99)
Potassium: 4.7 mmol/L (ref 3.5–5.3)
Sodium: 142 mmol/L (ref 135–146)
Total Bilirubin: 0.6 mg/dL (ref 0.2–1.2)
Total Protein: 6.9 g/dL (ref 6.1–8.1)
eGFR: 84 mL/min/{1.73_m2} (ref >=60)

## 2022-04-12 LAB — LIPID PANEL
Cholesterol: 141 mg/dL (ref <200)
HDL: 54 mg/dL (ref >=50)
LDL Cholesterol (Calc): 66 mg/dL (calc)
Non-HDL Cholesterol (Calc): 87 mg/dL (calc) (ref <130)
Total CHOL/HDL Ratio: 2.6 (calc) (ref <5.0)
Triglycerides: 126 mg/dL (ref <150)

## 2022-04-12 LAB — HEMOGLOBIN A1C
Hgb A1c MFr Bld: 5.6 % of total Hgb (ref <5.7)
Mean Plasma Glucose: 114 mg/dL
eAG (mmol/L): 6.3 mmol/L

## 2022-04-12 LAB — CBC WITH DIFFERENTIAL/PLATELET
Absolute Monocytes: 347 cells/uL (ref 200–950)
Basophils Absolute: 62 cells/uL (ref 0–200)
Basophils Relative: 1.1 %
Eosinophils Absolute: 112 cells/uL (ref 15–500)
Eosinophils Relative: 2 %
HCT: 41.7 % (ref 35.0–45.0)
Hemoglobin: 14 g/dL (ref 11.7–15.5)
Lymphs Abs: 2615 cells/uL (ref 850–3900)
MCH: 28.8 pg (ref 27.0–33.0)
MCHC: 33.6 g/dL (ref 32.0–36.0)
MCV: 85.8 fL (ref 80.0–100.0)
MPV: 9.6 fL (ref 7.5–12.5)
Monocytes Relative: 6.2 %
Neutro Abs: 2464 cells/uL (ref 1500–7800)
Neutrophils Relative %: 44 %
Platelets: 325 10*3/uL (ref 140–400)
RBC: 4.86 10*6/uL (ref 3.80–5.10)
RDW: 14.1 % (ref 11.0–15.0)
Total Lymphocyte: 46.7 %
WBC: 5.6 10*3/uL (ref 3.8–10.8)

## 2022-05-17 ENCOUNTER — Encounter: Payer: Self-pay | Admitting: Nurse Practitioner

## 2022-05-23 ENCOUNTER — Other Ambulatory Visit: Payer: Self-pay | Admitting: Internal Medicine

## 2022-05-23 ENCOUNTER — Institutional Professional Consult (permissible substitution): Payer: Managed Care, Other (non HMO) | Admitting: Pulmonary Disease

## 2022-05-23 DIAGNOSIS — Z1231 Encounter for screening mammogram for malignant neoplasm of breast: Secondary | ICD-10-CM

## 2022-05-24 ENCOUNTER — Institutional Professional Consult (permissible substitution): Payer: Managed Care, Other (non HMO) | Admitting: Pulmonary Disease

## 2022-06-04 ENCOUNTER — Other Ambulatory Visit: Payer: Self-pay | Admitting: Internal Medicine

## 2022-06-04 DIAGNOSIS — E1122 Type 2 diabetes mellitus with diabetic chronic kidney disease: Secondary | ICD-10-CM

## 2022-06-04 DIAGNOSIS — I10 Essential (primary) hypertension: Secondary | ICD-10-CM

## 2022-06-09 ENCOUNTER — Institutional Professional Consult (permissible substitution): Payer: Managed Care, Other (non HMO) | Admitting: Pulmonary Disease

## 2022-06-10 ENCOUNTER — Encounter: Payer: Self-pay | Admitting: Pulmonary Disease

## 2022-06-10 ENCOUNTER — Ambulatory Visit: Payer: Managed Care, Other (non HMO) | Admitting: Pulmonary Disease

## 2022-06-10 VITALS — BP 132/82 | HR 66 | Ht 64.0 in | Wt 172.0 lb

## 2022-06-10 DIAGNOSIS — G4733 Obstructive sleep apnea (adult) (pediatric): Secondary | ICD-10-CM

## 2022-06-10 NOTE — Progress Notes (Signed)
Jenny Singleton    [id number]    [birth year]  Primary Care Physician:McKeown, Gwyndolyn Saxon, MD  Referring Physician: Unk Pinto, MD [address] [place],  [place] [address]  Chief complaint:   Patient being seen for follow-up of obstructive sleep apnea  HPI:  Was last seen in the office many years ago Has lost a lot of weight since then  Has not been using a CPAP since about December She is no longer snoring Wakes up feeling like she is at a good nights rest No daytime symptoms No daytime sleepiness  Was diagnosed with severe obstructive sleep apnea with AHI of 46 in 2018 Was titrated to CPAP of 9 which she used up until recently  She continues to feel very well  She does have a history of hypertension, diabetes  Usually goes to bed between 8 and 9 PM Takes about 15 to 20 minutes to fall asleep About 2 awakenings Final wake up time between 530 and 6 AM in the morning    Outpatient Encounter Medications as of 06/10/2022  Medication Sig   aspirin EC 81 MG tablet Take 81 mg by mouth daily.   atorvastatin (LIPITOR) 20 MG tablet Take 1 tablet Daily for Cholesterol   buPROPion (WELLBUTRIN XL) 150 MG 24 hr tablet Take 1 tablet every Morning for Mood, Focus & Concentration   Cholecalciferol (VITAMIN D-3) 5000 UNITS TABS Take by mouth daily. 10,000 units   FARXIGA 10 MG TABS tablet Take 1 tablet Daily for Diabetes   Fexofenadine HCl (ALLEGRA PO) Take by mouth.   hydrochlorothiazide (HYDRODIURIL) 12.5 MG tablet Take 1 tablet Daily for BP & Fluid Retention /Ankle swelling   icosapent Ethyl (VASCEPA) 1 g capsule Take 2 capsules 2 x /day with Meals for Triglycerides (Blood Fats)   lisinopril (ZESTRIL) 40 MG tablet Take 1 tablet Daily for BP & Diabetic Kidney Protection / TAKE BY MOUTH   Magnesium 500 MG CAPS Take 1 capsule by mouth daily. Taking 950mg  daily   metFORMIN (GLUCOPHAGE-XR) 500 MG 24 hr tablet Take  2 tablets 2 x /day with Meals for Diabetes                                     /                            TAKE                        BY                  MOUTH   MOUNJARO 12.5 MG/0.5ML Pen INJECT 12.5 MG UNDER THE SKIN ONE DAY A WEEK   omeprazole (PRILOSEC) 40 MG capsule Take 1 capsule by mouth Daily to Prevent Heartburn & Indigestion   No facility-administered encounter medications on file as of 06/10/2022.    Allergies as of 06/10/2022 - Review Complete 06/10/2022  Allergen Reaction Noted   Sulfa antibiotics Other (See Comments) 06/10/2022   Welchol [colesevelam hcl] Hives 03/01/2013    Past Medical History:  Diagnosis Date   Colitis 07/11/2012   Colon polyps 07/11/2012   TUBULOVILLOUS ADENOMA   Diabetes mellitus    Exercise-induced asthma    outside asthma    Hernia    Hyperlipidemia    Hypertension  Iron deficiency    Kidney stones    Obesity    OSA on CPAP    Vitamin D deficiency     Past Surgical History:  Procedure Laterality Date   ABDOMINOPLASTY     ABLATION     COLONOSCOPY     COLONOSCOPY W/ BIOPSIES  07/11/2012   ESOPHAGOGASTRODUODENOSCOPY  07/11/2012   Normal    HERNIA REPAIR     2017, has had repaired x 3, [place]   TONSILECTOMY, ADENOIDECTOMY, BILATERAL MYRINGOTOMY AND TUBES      Family History  Adopted: Yes  Problem Relation Age of Onset   Colon cancer Father 21    Social History   Socioeconomic History   Marital status: Married    Spouse name: Not on file   Number of children: 2   Years of education: Not on file   Highest education level: Not on file  Occupational History    Employer: [employer]   Tobacco Use   Smoking status: Former    Packs/day: 1.00    Years: 2.00    Additional pack years: 0.00    Total pack years: 2.00    Types: Cigarettes    Quit date: 03/21/1985    Years since quitting: 37.2   Smokeless tobacco: Never   Tobacco comments:    smoked age 6-19  Vaping Use   Vaping Use: Never used  Substance and Sexual Activity   Alcohol use: Yes    Comment:  rare   Drug use: No   Sexual activity: Yes    Birth control/protection: None  Other Topics Concern   Not on file  Social History Narrative   Not on file   Social Determinants of Health   Financial Resource Strain: Not on file  Food Insecurity: Not on file  Transportation Needs: Not on file  Physical Activity: Not on file  Stress: Not on file  Social Connections: Not on file  Intimate Partner Violence: Not on file    Review of Systems  Constitutional:  Negative for fatigue.  Respiratory:  Negative for shortness of breath.   Psychiatric/Behavioral:  Negative for sleep disturbance.     Vitals:   06/10/22 1514  BP: 132/82  Pulse: 66  SpO2: 99%     Physical Exam Constitutional:      Appearance: Normal appearance.  HENT:     Head: Normocephalic.     Mouth/Throat:     Mouth: Mucous membranes are moist.  Eyes:     General: No scleral icterus. Cardiovascular:     Rate and Rhythm: Normal rate and regular rhythm.     Heart sounds: No murmur heard.    No friction rub.  Pulmonary:     Effort: No respiratory distress.     Breath sounds: No wheezing or rhonchi.  Musculoskeletal:     Cervical back: No rigidity or tenderness.  Neurological:     Mental Status: She is alert.  Psychiatric:        Mood and Affect: Mood normal.       06/10/2022    3:00 PM  Results of the Epworth flowsheet  Sitting and reading 0  Watching TV 1  Sitting, inactive in a public place (e.g. a theatre or a meeting) 0  As a passenger in a car for an hour without a break 0  Lying down to rest in the afternoon when circumstances permit 0  Sitting and talking to someone 0  Sitting quietly after a lunch without alcohol 0  In a car, while stopped for a few minutes in traffic 0  Total score 1      Data Reviewed: Previous sleep study from 2008 reviewed showing severe obstructive sleep apnea Titration study showed that she was titrated to a pressure of 9  No compliance data  available  Assessment:  History of severe obstructive sleep apnea with significant weight loss  She is no longer snoring  Has not been using CPAP on a nightly basis  She feels well at the present time with no significant symptoms of significant sleep apnea  Blood pressure is better, sugars better  Plan/Recommendations:  Schedule patient for an in lab sleep study to ascertain if there is presence of significant sleep apnea  Continue weight maintenance measures  Tentative follow-up in 3 months   Sherrilyn Rist MD [facility] 06/10/2022, 3:37 PM  CC: Unk Pinto, MD

## 2022-06-10 NOTE — Patient Instructions (Signed)
Schedule for an overnight study to ascertain presence of significant sleep apnea  With significant weight loss and not snoring anymore, feeling well when you wake up in the morning, it is unlikely that you still have significant sleep apnea but the only way we are sure is with a sleep study  We will let you know what the study shows as soon as it is reviewed  Tentatively we may see you back in about 3 months depending on the study result

## 2022-06-13 LAB — HM DIABETES EYE EXAM

## 2022-06-16 ENCOUNTER — Other Ambulatory Visit: Payer: Self-pay | Admitting: Nurse Practitioner

## 2022-06-16 ENCOUNTER — Encounter: Payer: Self-pay | Admitting: Nurse Practitioner

## 2022-06-16 DIAGNOSIS — E1122 Type 2 diabetes mellitus with diabetic chronic kidney disease: Secondary | ICD-10-CM

## 2022-06-16 MED ORDER — MOUNJARO 10 MG/0.5ML ~~LOC~~ SOAJ: 10.0000 mg | SUBCUTANEOUS | 3 refills | Status: DC

## 2022-06-17 ENCOUNTER — Encounter: Payer: Managed Care, Other (non HMO) | Admitting: Nurse Practitioner

## 2022-06-28 ENCOUNTER — Encounter: Payer: Self-pay | Admitting: Gastroenterology

## 2022-06-29 ENCOUNTER — Encounter: Payer: Self-pay | Admitting: Pulmonary Disease

## 2022-06-29 NOTE — Telephone Encounter (Signed)
PCCs can you please provide an update on pt's sleep study? Active request tab says TBC

## 2022-06-30 ENCOUNTER — Encounter: Payer: Self-pay | Admitting: Internal Medicine

## 2022-07-03 ENCOUNTER — Other Ambulatory Visit: Payer: Self-pay | Admitting: Internal Medicine

## 2022-07-03 DIAGNOSIS — F320 Major depressive disorder, single episode, mild: Secondary | ICD-10-CM

## 2022-07-03 DIAGNOSIS — E1122 Type 2 diabetes mellitus with diabetic chronic kidney disease: Secondary | ICD-10-CM

## 2022-07-03 DIAGNOSIS — I10 Essential (primary) hypertension: Secondary | ICD-10-CM

## 2022-07-11 ENCOUNTER — Ambulatory Visit
Admission: RE | Admit: 2022-07-11 | Discharge: 2022-07-11 | Disposition: A | Discharge status: Home / Self Care | Payer: Managed Care, Other (non HMO) | Source: Ambulatory Visit | Attending: Internal Medicine | Admitting: Internal Medicine

## 2022-07-11 DIAGNOSIS — Z1231 Encounter for screening mammogram for malignant neoplasm of breast: Secondary | ICD-10-CM

## 2022-07-14 NOTE — Progress Notes (Signed)
COMPLETE PHYSICAL  Assessment / Plan:   Encounter for routine medical examination with abnormal findings Yearly Mammogram UTD 06/2022 Colonoscopy scheduled end of May WILL RETURN FOR LABS WITHIN NEXT 2 WEEKS AS LAB TECH IS NOT HERE  Essential hypertension Continue current medications: lisinopril 40mg  and  HCTZ12.5mg  daily Monitor blood pressure at home; call if consistently over 130/80 Continue DASH diet.   Reminder to go to the ER if any CP, SOB, nausea, dizziness, severe HA, changes vision/speech, left arm numbness and tingling and jaw pain. -     CBC with Differential/Platelet -     CMP -     TSH  Mixed hyperlipidemia Continue medications: Atorvastatin 20mg  daily Discussed dietary and exercise modifications Low fat diet -     Lipid panel  Fatty liver LONG discussion about cirrhosis and fatty liver -CMP   Type 2 diabetes mellitus with other diabetic kidney complication, without long-term current use of insulin (HCC) Discussed general issues about diabetes pathophysiology and management., Educational material distributed., Suggested low cholesterol diet., Encouraged aerobic exercise., Discussed foot care., Reminded to get yearly retinal exam. Continue Mounjaro 12.5mg , farxiga 10mg , Metformin 500mg  two tablets BID. Follow up 3 months - A1c  Constipation Likely related to rybelsus  Discussed doubling magnesium dose Discussed stool softeners, colace or adding metamucil to det Discussed increase water intake and dietary changes.  Morbid Obesity (BMI) of 37.0 to 37.9 in adult [facility]) - follow up 3 months for progress monitoring - increase veggies, decrease carbs - long discussion about weight loss, diet, and exercise - bring food log- continue weight loss- goal is 175  Seasonal Allergies Advised to switch to generic Allegra to see if this helps more with symptoms  Gastroesophageal reflux disease without esophagitis Doing well at this time Continue: omprazole 40mg   daily Diet discussed Monitor for triggers Avoid food with high acid content Avoid excessive cafeine Increase water intake  Vitamin D deficiency Continue supplementation to maintain goal of 70-100 Taking Vitamin D 5,000IU daily Vitamin D level   Iron deficiency anemia, unspecified iron deficiency anemia type      No supplementation  OBSTRUCTIVE SLEEP APNEA Continue CPAP/BiPAP, using nightly for at least 8 hours  Helping with daytime fatigue Weight loss still advised Discussed mask & tubing hygeine  Medication management Magnesium  Screening, ischemic heart disease -     EKG 12-Lead  Screening for blood or protein in urine -     Routine UA with reflex microscopic -     Microalbumin / creatinine urine ratio  Screening for thyroid disorder       - TSH  Screening for AAA - U/S ABD Retroperitoneal LTD   Medication management -     [place], RETROPERITNL ABD,  LTD -     Cancel: Urinalysis, Routine w reflex microscopic -     Cancel: Microalbumin / creatinine urine ratio -     CBC with Differential/Platelet; Future -     COMPLETE METABOLIC PANEL WITH GFR; Future -     EKG 12-Lead -     Hemoglobin A1c; Future -     Lipid panel; Future -     Magnesium; Future -     Microalbumin / creatinine urine ratio; Future -     TSH; Future -     Urinalysis, Routine w reflex microscopic; Future -     VITAMIN D 25 Hydroxy (Vit-D Deficiency, Fractures); Future      Further disposition pending results if labs check today. Discussed med's effects and SE's.  Over 30 minutes of face to face interview, exam, counseling, chart review, and critical decision making was performed.   ________________________________________________________________________________  HPI 57 y.o. female  presents for CPE and 3 month follow up with hypertension, hyperlipidemia, diabetes and vitamin D.  She reports that she is having seasonal allergies and has altered between Zyrtec and Allegra    Her blood  pressure has been controlled at home, Currently on Lisinopril 40 mg QD and HCTZ 12.5 mg QD. today their BP is BP: 114/80  BP Readings from Last 3 Encounters:  07/18/22 114/80  06/10/22 132/82  04/11/22 124/88  She does not workout. She denies chest pain, shortness of breath, dizziness.     BMI is Body mass index is 29.82 kg/m., she is working on diet and exercise.. She is currently on Mounjaro 10 mg SQ QW because unable to get the 12.5 mg but would like to go back to 12/5 mg. 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  Wt Readings from Last 3 Encounters:  07/18/22 171 lb (77.6 kg)  06/10/22 172 lb (78 kg)  04/11/22 169 lb 12.8 oz (77 kg)   She has been working on diet and exercise for  diabetes with CKD on ACE/ARB  she is not on bASA She is on  Mounjaro 12.5mg  once a week Metformin 500mg   two tablets BID Farxiga 10mg  Denies foot ulcerations, hyperglycemia, hypoglycemia , increased appetite, nausea, paresthesia of the feet, polydipsia, polyuria, visual disturbances, vomiting and weight loss  Last A1C in the office was:   Lab Results  Component Value Date   HGBA1C 5.6 04/11/2022   She is trying to drink a lot of water Lab Results  Component Value Date   EGFR 84 04/11/2022     Hyperlipidemia is at goal of less than 70- on lipitor 20mg  and vascepa( only taking 1 pill BID instead of 2) Lab Results  Component Value Date   CHOL 141 04/11/2022   HDL 54 04/11/2022   LDLCALC 66 04/11/2022   TRIG 126 04/11/2022   CHOLHDL 2.6 04/11/2022    Patient is on Vitamin D supplement.  Lab Results  Component Value Date   VD25OH 88 06/16/2021     Thyroid has been WNL. Last Value: Lab Results  Component Value Date   TSH 1.16 09/27/2021     Current Medications:   Current Outpatient Medications (Endocrine & Metabolic):    FARXIGA 10 MG TABS tablet, Take 1 tablet Daily for Diabetes   metFORMIN (GLUCOPHAGE-XR) 500 MG 24 hr tablet, Take  2 tablets 2 x /day with Meals for Diabetes                                     /                            TAKE                        BY                  MOUTH   tirzepatide (MOUNJARO) 10 MG/0.5ML Pen, Inject 10 mg into the skin once a week.  Current Outpatient Medications (Cardiovascular):    atorvastatin (LIPITOR) 20 MG tablet, Take 1 tablet Daily for Cholesterol   hydrochlorothiazide (HYDRODIURIL) 12.5 MG tablet, Take 1 tablet Daily for BP &  Fluid Retention /Ankle swelling   icosapent Ethyl (VASCEPA) 1 g capsule, Take 2 capsules 2 x /day with Meals for Triglycerides (Blood Fats)   lisinopril (ZESTRIL) 40 MG tablet, Take 1 tablet Daily for BP & Diabetic Kidney Protection / TAKE BY MOUTH  Current Outpatient Medications (Respiratory):    Fexofenadine HCl (ALLEGRA PO), Take by mouth.  Current Outpatient Medications (Analgesics):    aspirin EC 81 MG tablet, Take 81 mg by mouth daily.   Current Outpatient Medications (Other):    buPROPion (WELLBUTRIN XL) 150 MG 24 hr tablet, Take 1 tablet every Morning for Mood, Focus & Concentration   Cholecalciferol (VITAMIN D-3) 5000 UNITS TABS, Take by mouth daily.   Magnesium 500 MG CAPS, Take 1 capsule by mouth daily.   omeprazole (PRILOSEC) 40 MG capsule, Take 1 capsule by mouth Daily to Prevent Heartburn & Indigestion  Medical History:  Past Medical History:  Diagnosis Date   Colitis 07/11/2012   Colon polyps 07/11/2012   TUBULOVILLOUS ADENOMA   Diabetes mellitus    Exercise-induced asthma    outside asthma    Hernia    Hyperlipidemia    Hypertension    Iron deficiency    Kidney stones    Obesity    OSA on CPAP    Vitamin D deficiency     Allergies:  Allergies  Allergen Reactions   Sulfa Antibiotics Other (See Comments)    Stomach hurt   Welchol [Colesevelam Hcl] Hives    HIves     Review of Systems:   SURGICAL HISTORY She  has a past surgical history that includes Tonsilectomy, adenoidectomy, bilateral myringotomy and tubes; Ablation; Abdominoplasty;  Esophagogastroduodenoscopy (07/11/2012); Colonoscopy w/ biopsies (07/11/2012); Hernia repair; and Colonoscopy. FAMILY HISTORY Her family history includes Colon cancer (age of onset: 78) in her father. She was adopted. SOCIAL HISTORY She  reports that she quit smoking about 37 years ago. Her smoking use included cigarettes. She has a 2.00 pack-year smoking history. She has never used smokeless tobacco. She reports current alcohol use. She reports that she does not use drugs.   Names of Other Physician/Practitioners you currently use: 1. [facility] here for primary care 2.Eye Exam: 03/2021 3. Dental Exam Q82months, scheduled  Patient Care Team: Lucky Cowboy, MD as PCP - General (Internal Medicine) Meryl Dare, MD as Consulting Physician (Gastroenterology) Adelene Idler, MD as Attending Physician (Ophthalmology) Foster Simpson, MD (Obstetrics and Gynecology) Gean Birchwood, MD as Consulting Physician (Orthopedic Surgery) Natalia Leatherwood, MD as Consulting Physician (Urology) Lorette Ang, MD (Dermatology)    Screening Tests: Immunization History  Administered Date(s) Administered   Influenza Whole 12/19/2008   Moderna Sars-Covid-2 Vaccination 05/24/2019, 06/21/2019, 02/15/2020   PPD Test 05/23/2013   Td 03/21/2006    Preventative care: Last colonoscopy: 04/2019, Q5 years Last mammogram:07/11/22 Last pap smear/pelvic exam: 2022,Dr Anderson DEXA:N/A     Review of Systems  Constitutional:  Negative for chills and fever.  HENT:  Negative for congestion, hearing loss, sinus pain, sore throat and tinnitus.   Eyes:  Negative for blurred vision and double vision.  Respiratory:  Negative for cough, hemoptysis, sputum production, shortness of breath and wheezing.   Cardiovascular:  Negative for chest pain, palpitations and leg swelling.  Gastrointestinal:  Negative for abdominal pain, constipation, diarrhea, heartburn, nausea and  vomiting.  Genitourinary:  Negative for dysuria and urgency.  Musculoskeletal:  Negative for back pain, falls, joint pain, myalgias and neck pain.  Skin:  Negative for rash.  Neurological:  Negative for dizziness, tingling, tremors, weakness and headaches.  Endo/Heme/Allergies:  Does not bruise/bleed easily.  Psychiatric/Behavioral:  Negative for depression and suicidal ideas. The patient is not nervous/anxious and does not have insomnia.      Physical Exam: BP 114/80   Pulse 76   Temp (!) 97.5 F (36.4 C)   Ht 5' 3.5" (1.613 m)   Wt 171 lb (77.6 kg)   SpO2 98%   BMI 29.82 kg/m  Wt Readings from Last 3 Encounters:  07/18/22 171 lb (77.6 kg)  06/10/22 172 lb (78 kg)  04/11/22 169 lb 12.8 oz (77 kg)    General Appearance: Morbidly obese, Well nourished well developed, in no apparent distress. Eyes: PERRLA, EOMs, conjunctiva no swelling or erythema ENT/Mouth: Ear canals normal without obstruction, swelling, erythma, discharge.  TMs normal bilaterally.  Oropharynx moist, clear, without exudate, or postoropharyngeal swelling. Neck: Supple, thyroid normal,no cervical adenopathy  Respiratory: Respiratory effort normal, Breath sounds clear A&P without rhonchi, wheeze, or rale.  No retractions, no accessory usage. Cardio: RRR with no MRGs. Brisk peripheral pulses without edema.  Abdomen: Soft, + BS,  Non tender, no guarding, rebound, hernias, masses. Musculoskeletal: Full ROM, 5/5 strength, Normal gait Skin: Warm, dry without rashes, lesions, ecchymosis.  Neuro: Awake and oriented X 3, Cranial nerves intact. Normal muscle tone, no cerebellar symptoms. Psych: Normal affect, Insight and Judgment appropriate.  EKG: NSR, No ST changes AAA: < 3 cm  [facility]  07/18/2022

## 2022-07-18 ENCOUNTER — Other Ambulatory Visit (HOSPITAL_BASED_OUTPATIENT_CLINIC_OR_DEPARTMENT_OTHER): Payer: Self-pay

## 2022-07-18 ENCOUNTER — Encounter: Payer: Self-pay | Admitting: Nurse Practitioner

## 2022-07-18 ENCOUNTER — Ambulatory Visit (INDEPENDENT_AMBULATORY_CARE_PROVIDER_SITE_OTHER): Payer: Managed Care, Other (non HMO) | Admitting: Nurse Practitioner

## 2022-07-18 ENCOUNTER — Other Ambulatory Visit: Payer: Self-pay

## 2022-07-18 ENCOUNTER — Telehealth: Payer: Self-pay

## 2022-07-18 ENCOUNTER — Other Ambulatory Visit: Payer: Managed Care, Other (non HMO)

## 2022-07-18 VITALS — BP 114/80 | HR 76 | Temp 97.5°F | Ht 63.5 in | Wt 171.0 lb

## 2022-07-18 DIAGNOSIS — J302 Other seasonal allergic rhinitis: Secondary | ICD-10-CM

## 2022-07-18 DIAGNOSIS — I700 Atherosclerosis of aorta: Secondary | ICD-10-CM | POA: Diagnosis not present

## 2022-07-18 DIAGNOSIS — K219 Gastro-esophageal reflux disease without esophagitis: Secondary | ICD-10-CM

## 2022-07-18 DIAGNOSIS — I10 Essential (primary) hypertension: Secondary | ICD-10-CM

## 2022-07-18 DIAGNOSIS — Z136 Encounter for screening for cardiovascular disorders: Secondary | ICD-10-CM | POA: Diagnosis not present

## 2022-07-18 DIAGNOSIS — Z1389 Encounter for screening for other disorder: Secondary | ICD-10-CM

## 2022-07-18 DIAGNOSIS — Z1329 Encounter for screening for other suspected endocrine disorder: Secondary | ICD-10-CM

## 2022-07-18 DIAGNOSIS — E559 Vitamin D deficiency, unspecified: Secondary | ICD-10-CM | POA: Diagnosis not present

## 2022-07-18 DIAGNOSIS — Z131 Encounter for screening for diabetes mellitus: Secondary | ICD-10-CM

## 2022-07-18 DIAGNOSIS — Z0000 Encounter for general adult medical examination without abnormal findings: Secondary | ICD-10-CM

## 2022-07-18 DIAGNOSIS — Z13220 Encounter for screening for lipoid disorders: Secondary | ICD-10-CM

## 2022-07-18 DIAGNOSIS — K760 Fatty (change of) liver, not elsewhere classified: Secondary | ICD-10-CM

## 2022-07-18 DIAGNOSIS — E1122 Type 2 diabetes mellitus with diabetic chronic kidney disease: Secondary | ICD-10-CM

## 2022-07-18 DIAGNOSIS — Z0001 Encounter for general adult medical examination with abnormal findings: Secondary | ICD-10-CM

## 2022-07-18 DIAGNOSIS — Z79899 Other long term (current) drug therapy: Secondary | ICD-10-CM

## 2022-07-18 DIAGNOSIS — E1169 Type 2 diabetes mellitus with other specified complication: Secondary | ICD-10-CM

## 2022-07-18 DIAGNOSIS — D509 Iron deficiency anemia, unspecified: Secondary | ICD-10-CM

## 2022-07-18 DIAGNOSIS — E669 Obesity, unspecified: Secondary | ICD-10-CM

## 2022-07-18 LAB — CBC WITH DIFFERENTIAL/PLATELET
HCT: 41.1 % (ref 35.0–45.0)
Hemoglobin: 13.8 g/dL (ref 11.7–15.5)
MCHC: 33.6 g/dL (ref 32.0–36.0)
Platelets: 338 10*3/uL (ref 140–400)
WBC: 6.5 10*3/uL (ref 3.8–10.8)

## 2022-07-18 MED ORDER — MOUNJARO 12.5 MG/0.5ML ~~LOC~~ SOAJ
12.5000 mg | SUBCUTANEOUS | 3 refills | Status: DC
Start: 2022-07-18 — End: 2022-07-19
  Filled 2022-07-18: qty 2, 28d supply, fill #0

## 2022-07-18 NOTE — Telephone Encounter (Signed)
Prior auth completed and submitted. 

## 2022-07-18 NOTE — Patient Instructions (Signed)

## 2022-07-19 ENCOUNTER — Other Ambulatory Visit: Payer: Self-pay | Admitting: Nurse Practitioner

## 2022-07-19 ENCOUNTER — Other Ambulatory Visit (HOSPITAL_BASED_OUTPATIENT_CLINIC_OR_DEPARTMENT_OTHER): Payer: Self-pay

## 2022-07-19 ENCOUNTER — Encounter: Payer: Self-pay | Admitting: Nurse Practitioner

## 2022-07-19 DIAGNOSIS — E1122 Type 2 diabetes mellitus with diabetic chronic kidney disease: Secondary | ICD-10-CM

## 2022-07-19 LAB — MICROALBUMIN / CREATININE URINE RATIO
Creatinine, Urine: 138 mg/dL (ref 20–275)
Microalb Creat Ratio: 3 mg/g creat (ref <30)
Microalb, Ur: 0.4 mg/dL

## 2022-07-19 LAB — MAGNESIUM: Magnesium: 2.1 mg/dL (ref 1.5–2.5)

## 2022-07-19 LAB — URINALYSIS, ROUTINE W REFLEX MICROSCOPIC
Bilirubin Urine: NEGATIVE
Hgb urine dipstick: NEGATIVE
Ketones, ur: NEGATIVE
Leukocytes,Ua: NEGATIVE
Nitrite: NEGATIVE
Protein, ur: NEGATIVE
Specific Gravity, Urine: 1.026 (ref 1.001–1.035)
pH: 5.5 (ref 5.0–8.0)

## 2022-07-19 LAB — HEMOGLOBIN A1C
Hgb A1c MFr Bld: 5.6 % of total Hgb (ref <5.7)
Mean Plasma Glucose: 114 mg/dL
eAG (mmol/L): 6.3 mmol/L

## 2022-07-19 LAB — LIPID PANEL
Cholesterol: 142 mg/dL (ref <200)
HDL: 49 mg/dL — ABNORMAL LOW (ref >=50)
LDL Cholesterol (Calc): 64 mg/dL (calc)
Non-HDL Cholesterol (Calc): 93 mg/dL (calc) (ref <130)
Total CHOL/HDL Ratio: 2.9 (calc) (ref <5.0)
Triglycerides: 231 mg/dL — ABNORMAL HIGH (ref <150)

## 2022-07-19 LAB — COMPLETE METABOLIC PANEL WITH GFR
AG Ratio: 1.8 (calc) (ref 1.0–2.5)
ALT: 14 U/L (ref 6–29)
AST: 8 U/L — ABNORMAL LOW (ref 10–35)
Albumin: 4.2 g/dL (ref 3.6–5.1)
Alkaline phosphatase (APISO): 44 U/L (ref 37–153)
BUN: 16 mg/dL (ref 7–25)
CO2: 31 mmol/L (ref 20–32)
Calcium: 9.5 mg/dL (ref 8.6–10.4)
Chloride: 103 mmol/L (ref 98–110)
Creat: 0.81 mg/dL (ref 0.50–1.03)
Globulin: 2.3 g/dL (calc) (ref 1.9–3.7)
Glucose, Bld: 65 mg/dL (ref 65–99)
Potassium: 4 mmol/L (ref 3.5–5.3)
Sodium: 143 mmol/L (ref 135–146)
Total Bilirubin: 0.4 mg/dL (ref 0.2–1.2)
Total Protein: 6.5 g/dL (ref 6.1–8.1)
eGFR: 85 mL/min/{1.73_m2} (ref >=60)

## 2022-07-19 LAB — TSH: TSH: 2.71 mIU/L (ref 0.40–4.50)

## 2022-07-19 LAB — CBC WITH DIFFERENTIAL/PLATELET
Absolute Monocytes: 351 cells/uL (ref 200–950)
Basophils Absolute: 59 cells/uL (ref 0–200)
Basophils Relative: 0.9 %
Eosinophils Absolute: 163 cells/uL (ref 15–500)
Eosinophils Relative: 2.5 %
Lymphs Abs: 2834 cells/uL (ref 850–3900)
MCH: 28 pg (ref 27.0–33.0)
MCV: 83.4 fL (ref 80.0–100.0)
MPV: 9.6 fL (ref 7.5–12.5)
Monocytes Relative: 5.4 %
Neutro Abs: 3094 cells/uL (ref 1500–7800)
Neutrophils Relative %: 47.6 %
RBC: 4.93 10*6/uL (ref 3.80–5.10)
RDW: 13.2 % (ref 11.0–15.0)
Total Lymphocyte: 43.6 %

## 2022-07-19 LAB — VITAMIN D 25 HYDROXY (VIT D DEFICIENCY, FRACTURES): Vit D, 25-Hydroxy: 67 ng/mL (ref 30–100)

## 2022-07-19 MED ORDER — MOUNJARO 12.5 MG/0.5ML ~~LOC~~ SOAJ
12.5000 mg | SUBCUTANEOUS | 3 refills | Status: DC
Start: 2022-07-19 — End: 2022-08-03

## 2022-07-19 NOTE — Telephone Encounter (Signed)
Prior auth approved through 01/15/23 on 12.5mg 

## 2022-07-21 ENCOUNTER — Ambulatory Visit (AMBULATORY_SURGERY_CENTER): Payer: Managed Care, Other (non HMO) | Admitting: *Deleted

## 2022-07-21 ENCOUNTER — Encounter: Payer: Self-pay | Admitting: Gastroenterology

## 2022-07-21 VITALS — Ht 64.0 in | Wt 165.0 lb

## 2022-07-21 DIAGNOSIS — Z86010 Personal history of colonic polyps: Secondary | ICD-10-CM

## 2022-07-21 DIAGNOSIS — Z800 Family history of malignant neoplasm of digestive organs: Secondary | ICD-10-CM

## 2022-07-21 MED ORDER — NA SULFATE-K SULFATE-MG SULF 17.5-3.13-1.6 GM/177ML PO SOLN
1.0000 | Freq: Once | ORAL | 0 refills | Status: AC
Start: 2022-07-21 — End: 2022-07-21

## 2022-07-21 NOTE — Progress Notes (Signed)
Pt's name and DOB verified at the beginning of the pre-visit.  Pt denies any difficulty with ambulating,sitting, laying down or rolling side to side Gave both [facility] main # and MD on call # prior to instructions.  No egg or soy allergy known to patient  No issues known to pt with past sedation with any surgeries or procedures Pt denies having issues being intubated Pt has no issues moving head neck or swallowing No FH of Malignant Hyperthermia Pt is not on diet pills Pt is not on home 02  Pt is not on blood thinners  Pt does have  issues with constipation takes colace for issue Pt is not on dialysis Pt denies any upcoming cardiac testing Pt encouraged to use to use Singlecare or Goodrx to reduce cost  Patient's chart reviewed by Cathlyn Parsons CNRA prior to pre-visit and patient appropriate for the [facility].  Pre-visit completed and red dot placed by patient's name on their procedure day (on provider's schedule).  . Visit by phone Pt states weight is  Instructed pt why it is important to and  to call if they have any changes in health or new medications. Directed them to the # given and on instructions.   Pt states they will.  Instructions reviewed with pt and pt states understanding. Instructed to review again prior to procedure. Pt states they will.  Instructions sent by mail with coupon and by my chart

## 2022-07-30 ENCOUNTER — Encounter: Payer: Self-pay | Admitting: Nurse Practitioner

## 2022-08-02 ENCOUNTER — Other Ambulatory Visit: Payer: Self-pay | Admitting: Internal Medicine

## 2022-08-02 ENCOUNTER — Other Ambulatory Visit: Payer: Self-pay | Admitting: Nurse Practitioner

## 2022-08-02 DIAGNOSIS — E1122 Type 2 diabetes mellitus with diabetic chronic kidney disease: Secondary | ICD-10-CM

## 2022-08-02 DIAGNOSIS — E1169 Type 2 diabetes mellitus with other specified complication: Secondary | ICD-10-CM

## 2022-08-05 ENCOUNTER — Encounter: Payer: Self-pay | Admitting: Pulmonary Disease

## 2022-08-05 ENCOUNTER — Telehealth: Payer: Self-pay

## 2022-08-05 ENCOUNTER — Other Ambulatory Visit: Payer: Self-pay | Admitting: Nurse Practitioner

## 2022-08-05 ENCOUNTER — Encounter: Payer: Self-pay | Admitting: Gastroenterology

## 2022-08-05 ENCOUNTER — Encounter: Payer: Self-pay | Admitting: Nurse Practitioner

## 2022-08-05 DIAGNOSIS — B029 Zoster without complications: Secondary | ICD-10-CM

## 2022-08-05 MED ORDER — GABAPENTIN 100 MG PO CAPS
100.0000 mg | ORAL_CAPSULE | Freq: Three times a day (TID) | ORAL | Status: DC
Start: 2022-08-05 — End: 2022-09-20

## 2022-08-05 NOTE — Telephone Encounter (Signed)
Attempted to do a prior authorization for [facility] but the Drug is covered by current benefit plan. No further PA activity needed

## 2022-08-05 NOTE — Telephone Encounter (Signed)
Dr Russella Dar please advise if pt can proceed with procedure as planned. She is aware that you are out of the office this week.

## 2022-08-08 ENCOUNTER — Telehealth: Payer: Self-pay

## 2022-08-08 NOTE — Telephone Encounter (Signed)
Prior auth completed and submitted. 

## 2022-08-08 NOTE — Telephone Encounter (Signed)
Prior auth approved

## 2022-08-17 ENCOUNTER — Encounter: Payer: Self-pay | Admitting: Gastroenterology

## 2022-08-17 ENCOUNTER — Ambulatory Visit (AMBULATORY_SURGERY_CENTER): Payer: Managed Care, Other (non HMO) | Admitting: Gastroenterology

## 2022-08-17 VITALS — BP 95/68 | HR 49 | Temp 97.1°F | Resp 11 | Ht 64.0 in | Wt 165.0 lb

## 2022-08-17 DIAGNOSIS — D120 Benign neoplasm of cecum: Secondary | ICD-10-CM

## 2022-08-17 DIAGNOSIS — Z800 Family history of malignant neoplasm of digestive organs: Secondary | ICD-10-CM

## 2022-08-17 DIAGNOSIS — D124 Benign neoplasm of descending colon: Secondary | ICD-10-CM | POA: Diagnosis not present

## 2022-08-17 DIAGNOSIS — Z1211 Encounter for screening for malignant neoplasm of colon: Secondary | ICD-10-CM

## 2022-08-17 DIAGNOSIS — Z86010 Personal history of colonic polyps: Secondary | ICD-10-CM

## 2022-08-17 DIAGNOSIS — K621 Rectal polyp: Secondary | ICD-10-CM | POA: Diagnosis not present

## 2022-08-17 DIAGNOSIS — D128 Benign neoplasm of rectum: Secondary | ICD-10-CM

## 2022-08-17 DIAGNOSIS — K6389 Other specified diseases of intestine: Secondary | ICD-10-CM | POA: Diagnosis not present

## 2022-08-17 DIAGNOSIS — K635 Polyp of colon: Secondary | ICD-10-CM | POA: Diagnosis not present

## 2022-08-17 MED ORDER — SODIUM CHLORIDE 0.9 % IV SOLN
500.0000 mL | Freq: Once | INTRAVENOUS | Status: DC
Start: 2022-08-17 — End: 2022-08-17

## 2022-08-17 NOTE — Patient Instructions (Signed)
Resume previous diet and medications. Awaiting pathology results. Repeat Colonoscopy date to be determined based on pathology results.  Handouts provided on Colon polyps and Hemorrhoids  YOU HAD AN ENDOSCOPIC PROCEDURE TODAY AT THE [facility]:   Refer to the procedure report that was given to you for any specific questions about what was found during the examination.  If the procedure report does not answer your questions, please call your gastroenterologist to clarify.  If you requested that your care partner not be given the details of your procedure findings, then the procedure report has been included in a sealed envelope for you to review at your convenience later.  YOU SHOULD EXPECT: Some feelings of bloating in the abdomen. Passage of more gas than usual.  Walking can help get rid of the air that was put into your GI tract during the procedure and reduce the bloating. If you had a lower endoscopy (such as a colonoscopy or flexible sigmoidoscopy) you may notice spotting of blood in your stool or on the toilet paper. If you underwent a bowel prep for your procedure, you may not have a normal bowel movement for a few days.  Please Note:  You might notice some irritation and congestion in your nose or some drainage.  This is from the oxygen used during your procedure.  There is no need for concern and it should clear up in a day or so.  SYMPTOMS TO REPORT IMMEDIATELY:  Following lower endoscopy (colonoscopy or flexible sigmoidoscopy):  Excessive amounts of blood in the stool  Significant tenderness or worsening of abdominal pains  Swelling of the abdomen that is new, acute  Fever of 100F or higher   For urgent or emergent issues, a gastroenterologist can be reached at any hour by calling [phone]. Do not use MyChart messaging for urgent concerns.    DIET:  We do recommend a small meal at first, but then you may proceed to your regular diet.  Drink plenty of fluids but  you should avoid alcoholic beverages for 24 hours.  ACTIVITY:  You should plan to take it easy for the rest of today and you should NOT DRIVE or use heavy machinery until tomorrow (because of the sedation medicines used during the test).    FOLLOW UP: Our staff will call the number listed on your records the next business day following your procedure.  We will call around 7:15- 8:00 am to check on you and address any questions or concerns that you may have regarding the information given to you following your procedure. If we do not reach you, we will leave a message.     If any biopsies were taken you will be contacted by phone or by letter within the next 1-3 weeks.  Please call us at [phone] if you have not heard about the biopsies in 3 weeks.    SIGNATURES/CONFIDENTIALITY: You and/or your care partner have signed paperwork which will be entered into your electronic medical record.  These signatures attest to the fact that that the information above on your After Visit Summary has been reviewed and is understood.  Full responsibility of the confidentiality of this discharge information lies with you and/or your care-partner. 

## 2022-08-17 NOTE — Op Note (Signed)
[facility] Patient Name: Jenny Singleton Procedure Date: 08/17/2022 8:57 AM MRN: [id number] Endoscopist: Meryl Dare , MD, [phone] Age: 57 Referring MD:  Date of Birth: [birth year] Gender: Female Account #: [id number] Procedure:                Colonoscopy Indications:              Screening in patient at increased risk: Family                            history of 1st-degree relative with colorectal                            cancer Medicines:                Monitored Anesthesia Care Procedure:                Pre-Anesthesia Assessment:                           - Prior to the procedure, a History and Physical                            was performed, and patient medications and                            allergies were reviewed. The patient's tolerance of                            previous anesthesia was also reviewed. The risks                            and benefits of the procedure and the sedation                            options and risks were discussed with the patient.                            All questions were answered, and informed consent                            was obtained. Prior Anticoagulants: The patient has                            taken no anticoagulant or antiplatelet agents. ASA                            Grade Assessment: II - A patient with mild systemic                            disease. After reviewing the risks and benefits,                            the patient was deemed in satisfactory condition to  undergo the procedure.                           After obtaining informed consent, the colonoscope                            was passed under direct vision. Throughout the                            procedure, the patient's blood pressure, pulse, and                            oxygen saturations were monitored continuously. The                            CF [id number] #[id number] was introduced through the anus                             and advanced to the the cecum, identified by                            appendiceal orifice and ileocecal valve. The                            ileocecal valve, appendiceal orifice, and rectum                            were photographed. The quality of the bowel                            preparation was good. The colonoscopy was performed                            without difficulty. The patient tolerated the                            procedure well. Scope In: 9:15:13 AM Scope Out: 9:29:52 AM Scope Withdrawal Time: 0 hours 11 minutes 55 seconds  Total Procedure Duration: 0 hours 14 minutes 39 seconds  Findings:                 The perianal and digital rectal examinations were                            normal.                           Three sessile polyps were found in the rectum,                            descending colon and ileocecal valve. The polyps                            were 6 to 8 mm in size. These polyps were removed  with a cold snare. Resection and retrieval were                            complete.                           External and internal hemorrhoids were found during                            retroflexion. The hemorrhoids were small and Grade                            I (internal hemorrhoids that do not prolapse).                           The exam was otherwise without abnormality on                            direct and retroflexion views. Complications:            No immediate complications. Estimated blood loss:                            None. Estimated Blood Loss:     Estimated blood loss: none. Impression:               - Three 6 to 8 mm polyps in the rectum, in the                            descending colon and at the ileocecal valve,                            removed with a cold snare. Resected and retrieved.                           - External and internal hemorrhoids.                            - The examination was otherwise normal on direct                            and retroflexion views. Recommendation:           - Repeat colonoscopy after studies are complete for                            surveillance based on pathology results.                           - Patient has a contact number available for                            emergencies. The signs and symptoms of potential                            delayed complications were discussed  with the                            patient. Return to normal activities tomorrow.                            Written discharge instructions were provided to the                            patient.                           - Resume previous diet.                           - Continue present medications.                           - Await pathology results. Meryl Dare, MD 08/17/2022 9:37:15 AM This report has been signed electronically.

## 2022-08-17 NOTE — Progress Notes (Signed)
Called to room to assist during endoscopic procedure.  Patient ID and intended procedure confirmed with present staff. Received instructions for my participation in the procedure from the performing physician.  

## 2022-08-17 NOTE — Progress Notes (Signed)
To pacu, VSS. Report to [email or web address] 

## 2022-08-17 NOTE — Progress Notes (Signed)
See 07/18/2022 H&P, no changes 

## 2022-08-17 NOTE — Progress Notes (Signed)
Pt's states no medical or surgical changes since previsit or office visit. 

## 2022-08-18 ENCOUNTER — Telehealth: Payer: Self-pay | Admitting: *Deleted

## 2022-08-18 NOTE — Telephone Encounter (Signed)
  Follow up Call-     08/17/2022    8:25 AM  Call back number  Post procedure Call Back phone  # [phone]  Permission to leave phone message Yes     Patient questions:  Do you have a fever, pain , or abdominal swelling? No. Pain Score  0 *  Have you tolerated food without any problems? Yes.    Have you been able to return to your normal activities? Yes.    Do you have any questions about your discharge instructions: Diet   No. Medications  No. Follow up visit  No.  Do you have questions or concerns about your Care? No.  Actions: * If pain score is 4 or above: No action needed, pain <4.

## 2022-08-24 ENCOUNTER — Encounter: Payer: Self-pay | Admitting: Internal Medicine

## 2022-09-01 ENCOUNTER — Encounter: Payer: Self-pay | Admitting: Gastroenterology

## 2022-09-06 ENCOUNTER — Ambulatory Visit (HOSPITAL_BASED_OUTPATIENT_CLINIC_OR_DEPARTMENT_OTHER): Payer: Managed Care, Other (non HMO) | Attending: Pulmonary Disease | Admitting: Pulmonary Disease

## 2022-09-06 DIAGNOSIS — G4733 Obstructive sleep apnea (adult) (pediatric): Secondary | ICD-10-CM | POA: Diagnosis present

## 2022-09-11 ENCOUNTER — Telehealth: Payer: Self-pay | Admitting: Pulmonary Disease

## 2022-09-11 DIAGNOSIS — G4733 Obstructive sleep apnea (adult) (pediatric): Secondary | ICD-10-CM

## 2022-09-11 NOTE — Procedures (Signed)
POLYSOMNOGRAPHY  Last, First: Paulla, Mcclaskey MRN: [id number] Gender: Female Age (years): 57 Weight (lbs): 165 DOB: 01/02/1966 BMI: 28 Primary Care: No PCP Epworth Score: 2 Referring: Tomma Lightning MD Technician: Shelah Lewandowsky Interpreting: Tomma Lightning MD Study Type: NPSG Ordered Study Type: NPSG Study date: 09/06/2022 Location: [place] CLINICAL INFORMATION Jenny Singleton is a 57 year old Female and was referred to the sleep center for evaluation of G47.33 OSA: Adult and Pediatric (327.23). Indications include Diabetes, Hypertension, Re-Evaluation, Witnesses Apnea / Gasping During Sleep.   Most recent polysomnogram dated 09/06/2006 revealed an AHI of 46/h. MEDICATIONS Patient self administered medications include: N/A. Medications administered during study include No sleep medicine administered.  SLEEP STUDY TECHNIQUE A multi-channel overnight Polysomnography study was performed. The channels recorded and monitored were central and occipital EEG, electrooculogram (EOG), submentalis EMG (chin), nasal and oral airflow, thoracic and abdominal wall motion, anterior tibialis EMG, snore microphone, electrocardiogram, and a pulse oximetry. TECHNICIAN COMMENTS Comments added by Technician: NONE Comments added by Scorer: N/A SLEEP ARCHITECTURE The study was initiated at 9:45:54 PM and terminated at 4:26:57 AM. The total recorded time was 401 minutes. EEG confirmed total sleep time was 343.5 minutes yielding a sleep efficiency of 85.7%. Sleep onset after lights out was 19.7 minutes with a REM latency of 100.0 minutes. The patient spent 7.3% of the night in stage N1 sleep, 67.1% in stage N2 sleep, 0.9% in stage N3 and 24.8% in REM. Wake after sleep onset (WASO) was 37.8 minutes. The Arousal Index was 22.7/hour. RESPIRATORY PARAMETERS There were a total of 9 respiratory disturbances out of which 0 were apneas ( 0 obstructive, 0 mixed, 0 central) and 9 hypopneas. The apnea/hypopnea  index (AHI) was 1.6 events/hour. The central sleep apnea index was 0 events/hour. The REM AHI was 0.0 events/hour and NREM AHI was 2.1 events/hour. The supine AHI was 0.0 events/hour and the non supine AHI was 1.6 supine during 0.04% of sleep. Respiratory disturbances were associated with oxygen desaturation down to a nadir of 89.0% during sleep. The mean oxygen saturation during the study was 94.2%.   LEG MOVEMENT DATA The total leg movements were 0 with a resulting leg movement index of 0.0/hr .Associated arousal with leg movement index was 0.0/hr.  CARDIAC DATA The underlying cardiac rhythm was most consistent with sinus rhythm. Mean heart rate during sleep was 62.1 bpm. Additional rhythm abnormalities include None.  IMPRESSIONS - No Significant Obstructive Sleep apnea(OSA) - EKG showed no cardiac abnormalities. - The patient snored with soft snoring volume. - No significant periodic leg movements(PLMs) during sleep. However, no significant associated arousals.  DIAGNOSIS - Negative study for significant sleep disordered breathing - Mild snoring  RECOMMENDATIONS - Clinical follow-up of symptoms. - Avoid alcohol, sedatives and other CNS depressants that may worsen sleep apnea and disrupt normal sleep architecture. - Sleep hygiene should be reviewed to assess factors that may improve sleep quality. - Weight management and regular exercise should be initiated or continued.  [Electronically signed] 09/11/2022 08:35 AM  Virl Diamond MD NPI: [id number]

## 2022-09-11 NOTE — Telephone Encounter (Signed)
Call patient  Sleep study result  Date of study: 09/06/2022  Impression: Negative study for clinically significant sleep disordered breathing  Recommendation: Clinical follow-up of symptoms  No indication for CPAP therapy  Continue weight management efforts

## 2022-09-12 NOTE — Telephone Encounter (Signed)
ATC x1 LVM for patient to call pur office back regarding sleep study result's.

## 2022-09-20 ENCOUNTER — Encounter: Payer: Self-pay | Admitting: Nurse Practitioner

## 2022-09-20 ENCOUNTER — Ambulatory Visit: Payer: Managed Care, Other (non HMO) | Admitting: Nurse Practitioner

## 2022-09-20 VITALS — Ht 63.5 in

## 2022-09-20 DIAGNOSIS — U071 COVID-19: Secondary | ICD-10-CM

## 2022-09-20 MED ORDER — MOLNUPIRAVIR EUA 200MG CAPSULE
4.0000 | ORAL_CAPSULE | Freq: Two times a day (BID) | ORAL | 0 refills | Status: AC
Start: 2022-09-20 — End: 2022-09-25

## 2022-09-20 NOTE — Telephone Encounter (Signed)
Patient was given result's on mychart.   Nothing else further needed.

## 2022-09-20 NOTE — Patient Instructions (Signed)
COVID Take tylenol PRN temp 101+ Push hydration Regular ambulation or calf exercises exercises for clot prevention and 81 mg ASA unless contraindicated Sx supportive therapy suggested Follow up via mychart or telephone if needed Advised patient obtain O2 monitor; present to ED if persistently <90% or with severe dyspnea, CP, fever uncontrolled by tylenol, confusion, sudden decline Should remain in isolation until at least 5 days from positive test and the next 5 days wear a mask when around other people

## 2022-09-20 NOTE — Telephone Encounter (Signed)
Call cell number

## 2022-09-20 NOTE — Progress Notes (Signed)
THIS ENCOUNTER IS A VIRTUAL VISIT DUE TO COVID-19 - PATIENT WAS NOT SEEN IN THE OFFICE.  PATIENT HAS CONSENTED TO VIRTUAL VISIT / TELEMEDICINE VISIT   Virtual Visit via telephone Note  I connected with  Jenny Singleton on 09/20/2022 by telephone.  I verified that I am speaking with the correct person using two identifiers.    I discussed the limitations of evaluation and management by telemedicine and the availability of in person appointments. The patient expressed understanding and agreed to proceed.  History of Present Illness:  Ht 5' 3.5" (1.613 m)   BMI 28.77 kg/m  57 y.o. patient contacted office reporting URI sx . she tested positive by home covid test. OV was conducted by telephone to minimize exposure. This patient was vaccinated for covid 19, last 02/15/20 Immunization History  Administered Date(s) Administered   Influenza Whole 12/19/2008   Moderna Sars-Covid-2 Vaccination 05/24/2019, 06/21/2019, 02/15/2020   PPD Test 05/23/2013   Td 03/21/2006     Sx began 1 day ago with green nasal congestion, dry cough, headaches, fatigue, body aches, mild loss of smell  Treatments tried so far: Flonase  Exposures: Was in [place] and came back 6 days ago.    Medications  Current Outpatient Medications (Endocrine & Metabolic):    FARXIGA 10 MG TABS tablet, Take 1 tablet Daily for Diabetes   metFORMIN (GLUCOPHAGE-XR) 500 MG 24 hr tablet, Take 2 tablets 2 x /day with Meals for Diabetes / TAKE BY MOUTH   tirzepatide (MOUNJARO) 12.5 MG/0.5ML Pen, ADMINISTER 12.5 MG UNDER THE SKIN 1 TIME A WEEK  Current Outpatient Medications (Cardiovascular):    atorvastatin (LIPITOR) 20 MG tablet, Take 1 tablet Daily for Cholesterol   hydrochlorothiazide (HYDRODIURIL) 12.5 MG tablet, Take 1 tablet Daily for BP & Fluid Retention /Ankle swelling   icosapent Ethyl (VASCEPA) 1 g capsule, Take 2 capsules 2 x /day with Meals for Triglycerides (Blood Fats) (Patient taking differently: Takes  1 capsules  1x  /day  with Meals  for Triglycerides (Blood Fats))   lisinopril (ZESTRIL) 40 MG tablet, Take 1 tablet Daily for BP & Diabetic Kidney Protection / TAKE BY MOUTH  Current Outpatient Medications (Respiratory):    Fexofenadine HCl (ALLEGRA PO), Take by mouth.  Current Outpatient Medications (Analgesics):    aspirin EC 81 MG tablet, Take 81 mg by mouth daily.   Current Outpatient Medications (Other):    buPROPion (WELLBUTRIN XL) 150 MG 24 hr tablet, Take 1 tablet every Morning for Mood, Focus & Concentration   Cholecalciferol (VITAMIN D-3) 5000 UNITS TABS, Take by mouth daily.   docusate sodium (COLACE) 50 MG capsule, Take 50 mg by mouth 2 (two) times daily.   Magnesium 500 MG CAPS, Take 1 capsule by mouth daily.   ofloxacin (OCUFLOX) 0.3 % ophthalmic solution,    omeprazole (PRILOSEC) 40 MG capsule, Take 1 capsule by mouth Daily to Prevent Heartburn & Indigestion   gabapentin (NEURONTIN) 100 MG capsule, Take 1 capsule (100 mg total) by mouth 3 (three) times daily.  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   Social History:   reports that she quit smoking about 37 years ago. Her smoking use included cigarettes. She has a 2.00 pack-year smoking history. She has never used smokeless tobacco. She reports current alcohol use. She reports that she does not use drugs.  Observations/Objective:  General : Well sounding patient in no apparent  distress HEENT: no hoarseness, no cough for duration of visit Lungs: speaks in complete sentences, no audible wheezing, no apparent distress Neurological: alert, oriented x 3 Psychiatric: pleasant, judgement appropriate   Assessment and Plan:  Covid 19 Covid 19 positive per rapid screening test home test today Risk factors include:  Patient Active Problem List   Diagnosis Date Noted   Type 2 diabetes mellitus with stage 2 chronic kidney disease, without long-term current use of insulin (HCC) 05/07/2019   Hyperlipidemia associated with type 2 diabetes mellitus (HCC) 01/03/2018   Vitamin D deficiency 01/03/2018   Medication management 01/03/2018   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) 09/25/2014   Transaminitis 07/10/2012   Fatty liver 07/10/2012   Iron deficiency anemia 07/10/2012   Morbid obesity (HCC) 09/02/2010   OBSTRUCTIVE SLEEP APNEA 06/12/2007   Exercise induced bronchospasm 06/12/2007   Essential hypertension 12/01/2006    Symptoms are: mild Immue support reviewed  Take tylenol PRN temp 101+ Push hydration Regular ambulation or calf exercises exercises for clot prevention and 81 mg ASA unless contraindicated Sx supportive therapy suggested Follow up via mychart or telephone if needed Advised patient obtain O2 monitor; present to ED if persistently <90% or with severe dyspnea, CP, fever uncontrolled by tylenol, confusion, sudden decline Should remain in isolation until at least 5 days from positive test and the next 5 days wear a mask when around other people   COVID -     molnupiravir EUA (LAGEVRIO) 200 mg CAPS capsule; Take 4 capsules (800 mg total) by mouth 2 (two) times daily for 5 days.      Follow Up Instructions:  I discussed the assessment and treatment plan with the patient. The patient was provided an opportunity to ask questions and all were answered. The patient agreed with the plan and demonstrated an understanding of the instructions.   The patient was  advised to call back or seek an in-person evaluation if the symptoms worsen or if the condition fails to improve as anticipated.  I provided 15 minutes of non-face-to-face time during this encounter.   Raynelle Dick, NP

## 2022-11-24 ENCOUNTER — Encounter: Payer: Self-pay | Admitting: Nurse Practitioner

## 2022-11-24 ENCOUNTER — Other Ambulatory Visit: Payer: Self-pay | Admitting: Nurse Practitioner

## 2022-11-24 DIAGNOSIS — I10 Essential (primary) hypertension: Secondary | ICD-10-CM

## 2022-11-24 DIAGNOSIS — E1122 Type 2 diabetes mellitus with diabetic chronic kidney disease: Secondary | ICD-10-CM

## 2022-11-24 DIAGNOSIS — F320 Major depressive disorder, single episode, mild: Secondary | ICD-10-CM

## 2022-11-24 DIAGNOSIS — E1169 Type 2 diabetes mellitus with other specified complication: Secondary | ICD-10-CM

## 2022-11-24 DIAGNOSIS — K219 Gastro-esophageal reflux disease without esophagitis: Secondary | ICD-10-CM

## 2022-11-24 MED ORDER — DAPAGLIFLOZIN PROPANEDIOL 10 MG PO TABS: 10.0000 mg | ORAL_TABLET | Freq: Every day | ORAL | 0 refills | Status: DC

## 2022-11-24 MED ORDER — LISINOPRIL 40 MG PO TABS
ORAL_TABLET | ORAL | 0 refills | Status: DC
Start: 2022-11-24 — End: 2023-02-14

## 2022-11-24 MED ORDER — ATORVASTATIN CALCIUM 20 MG PO TABS
ORAL_TABLET | ORAL | 0 refills | Status: DC
Start: 2022-11-24 — End: 2023-04-11

## 2022-11-24 MED ORDER — METFORMIN HCL ER 500 MG PO TB24
ORAL_TABLET | ORAL | 0 refills | Status: DC
Start: 2022-11-24 — End: 2023-02-14

## 2022-11-24 MED ORDER — HYDROCHLOROTHIAZIDE 12.5 MG PO TABS
ORAL_TABLET | ORAL | 0 refills | Status: DC
Start: 2022-11-24 — End: 2023-02-14

## 2022-11-24 MED ORDER — MOUNJARO 12.5 MG/0.5ML ~~LOC~~ SOAJ
12.5000 mg | SUBCUTANEOUS | 0 refills | Status: DC
Start: 2022-11-24 — End: 2022-12-08

## 2022-11-24 MED ORDER — OMEPRAZOLE 40 MG PO CPDR
DELAYED_RELEASE_CAPSULE | ORAL | 0 refills | Status: DC
Start: 2022-11-24 — End: 2023-03-07

## 2022-11-24 MED ORDER — BUPROPION HCL ER (XL) 150 MG PO TB24: ORAL_TABLET | ORAL | 0 refills | Status: DC

## 2022-11-24 NOTE — Progress Notes (Deleted)
FOLLOW UP 3 MONTH  Assessment and Plan:    Essential hypertension Continue current medications: lisinopril 20mg   Monitor blood pressure at home; call if consistently over 130/80 Continue DASH diet.   Reminder to go to the ER if any CP, SOB, nausea, dizziness, severe HA, changes vision/speech, left arm numbness and tingling and jaw pain. -     CBC with Differential/Platelet -     CMP   Mixed hyperlipidemia associated with Type 2 DM Continue medications: Atorvastatin 20mg  daily, Vascepa 1g 1 caps BID Discussed dietary and exercise modifications Low fat diet -     Lipid panel  Fatty liver Continue diet, exercise, weight loss and limit Tylenol/Alcohol Use -CMP   Type 2 diabetes mellitus with other diabetic kidney complication, without long-term current use of insulin (HCC) Discussed general issues about diabetes pathophysiology and management., Educational material distributed., Suggested low cholesterol diet., Encouraged aerobic exercise., Discussed foot care., Reminded to get yearly retinal exam.        freestyle libre Continue Mounjaro 12.5mg , farxiga 10mg , Metformin 500mg  two tablets BID. Follow up 3 months - A1c  Overweight with serious comorbidity - follow up 3 months for progress monitoring - increase veggies, decrease carbs - long discussion about weight loss, diet, and exercise - bring food log- continue weight loss- goal is 175  Gastroesophageal reflux disease without esophagitis Doing well at this time Continue: omprazole 40mg  daily Diet discussed Monitor for triggers Avoid food with high acid content Avoid excessive cafeine Increase water intake -magnesium  CKD stage 2 due to Type 2 Diabetes Mellitus Increase fluids, avoid NSAIDS, monitor sugars, will monitor   Vitamin D deficiency Continue supplementation to maintain goal of 70-100 Taking Vitamin D 5,000IU daily Defer vitamin D level   OBSTRUCTIVE SLEEP APNEA Continue CPAP, using nightly for at least  8 hours  Will order another sleep study to see if still required  has lost over 100 pounds  Medication management Magnesium   Further disposition pending results if labs check today. Discussed med's effects and SE's.   Over 30 minutes of face to face interview, exam, counseling, chart review, and critical decision making was performed.    HPI 57 y.o. female  presents for 3 month follow up with hypertension, hyperlipidemia, diabetes and vitamin D.  She was last seen on 12/12/19 for three month follow up.    Her blood pressure has been controlled at home, today their BP is    She is currently on lisinopril 20 mg QD and HCTZ 12.5 mg daily BP Readings from Last 3 Encounters:  08/17/22 95/68  07/18/22 114/80  06/10/22 132/82   She does not workout. She denies chest pain, shortness of breath, dizziness.    BMI is There is no height or weight on file to calculate BMI., she is working on diet and exercise. She is currently on Mounjaro 12.5 mg . Highest weight was 270. Just got back from cruise for 10 days.  Wt Readings from Last 3 Encounters:  09/06/22 165 lb (74.8 kg)  08/17/22 165 lb (74.8 kg)  07/21/22 165 lb (74.8 kg)   She has been working on diet and exercise for  diabetes with CKD on ACE/ARB Hyperlipidemia is at goal of less than 70- on lipitor 20mg  and vascepa  she is on bASA Farxiga Metformin 500 mg 2 tabs BID Mounjaro 12.5 mg  denies foot ulcerations, hyperglycemia, hypoglycemia , increased appetite, nausea, paresthesia of the feet, polydipsia, polyuria, visual disturbances, vomiting and weight loss  Last A1C  in the office was:   Lab Results  Component Value Date   HGBA1C 5.6 07/18/2022   Drinking lots of water Lab Results  Component Value Date   EGFR 85 07/18/2022   She is currently on Lipitor 20 mg daily. Her cholesterol is at goal. Lab Results  Component Value Date   CHOL 142 07/18/2022   HDL 49 (L) 07/18/2022   LDLCALC 64 07/18/2022   TRIG 231 (H) 07/18/2022    CHOLHDL 2.9 07/18/2022   She does have sleep apnea and uses her CPAP nightly  GERD symptoms are well controlled on Omeprazole 40 mg QD and diet modifications  Patient is on Vitamin D supplement.  Lab Results  Component Value Date   VD25OH 67 07/18/2022       Current Medications:   Current Outpatient Medications (Endocrine & Metabolic):    dapagliflozin propanediol (FARXIGA) 10 MG TABS tablet, Take 1 tablet (10 mg total) by mouth daily.   metFORMIN (GLUCOPHAGE-XR) 500 MG 24 hr tablet, Take  2 tablets 2 x /day with Meals for Diabetes                                    /                            TAKE                        BY                  MOUTH   tirzepatide (MOUNJARO) 12.5 MG/0.5ML Pen, Inject 12.5 mg into the skin once a week.  Current Outpatient Medications (Cardiovascular):    atorvastatin (LIPITOR) 20 MG tablet, Take  1 tablet  Daily  for Cholesterol   hydrochlorothiazide (HYDRODIURIL) 12.5 MG tablet, Take  1 tablet  Daily  for BP & Fluid Retention /Ankle swelling   icosapent Ethyl (VASCEPA) 1 g capsule, Take 2 capsules 2 x /day with Meals for Triglycerides (Blood Fats) (Patient taking differently: Takes  1 capsules  1x /day  with Meals  for Triglycerides (Blood Fats))   lisinopril (ZESTRIL) 40 MG tablet, Take  1 tablet  Daily for BP & Diabetic Kidney Protection                                         /                   TAKE                     BY                        MOUTH  Current Outpatient Medications (Respiratory):    Fexofenadine HCl (ALLEGRA PO), Take by mouth.  Current Outpatient Medications (Analgesics):    aspirin EC 81 MG tablet, Take 81 mg by mouth daily.   Current Outpatient Medications (Other):    buPROPion (WELLBUTRIN XL) 150 MG 24 hr tablet, Take 1  tablet  every Morning  for Mood, Focus & Concentration   Cholecalciferol (VITAMIN D-3) 5000 UNITS TABS, Take by mouth daily.   docusate sodium (COLACE) 50 MG capsule, Take 50 mg by mouth 2 (  two) times  daily.   Magnesium 500 MG CAPS, Take 1 capsule by mouth daily.   ofloxacin (OCUFLOX) 0.3 % ophthalmic solution,    omeprazole (PRILOSEC) 40 MG capsule, Take 1 capsule by mouth Daily to Prevent Heartburn & Indigestion  Medical History:  Past Medical History:  Diagnosis Date   Anemia    Colitis 07/11/2012   Colon polyps 07/11/2012   TUBULOVILLOUS ADENOMA   Diabetes mellitus    Exercise-induced asthma    outside asthma    Hernia    Hyperlipidemia    Hypertension    Iron deficiency    Kidney stones    Obesity    OSA on CPAP    Sleep apnea    Vitamin D deficiency     Allergies:  Allergies  Allergen Reactions   Sulfa Antibiotics Other (See Comments)    Stomach hurt   Welchol [Colesevelam Hcl] Hives    HIves     Review of Systems:  Review of Systems  Constitutional:  Negative for chills, fever and malaise/fatigue.  HENT:  Negative for congestion, ear pain and sore throat.   Eyes: Negative.   Respiratory:  Negative for cough, shortness of breath and wheezing.        No longer snoring  Cardiovascular:  Negative for chest pain, palpitations and leg swelling.  Gastrointestinal:  Negative for abdominal pain, blood in stool, constipation, diarrhea, heartburn and melena.  Genitourinary: Negative.   Skin: Negative.   Neurological:  Negative for dizziness, sensory change, loss of consciousness and headaches.  Psychiatric/Behavioral:  Negative for depression. The patient is not nervous/anxious and does not have insomnia.     Family history- Review and unchanged  Social history- Review and unchanged  Physical Exam: There were no vitals taken for this visit. Wt Readings from Last 3 Encounters:  09/06/22 165 lb (74.8 kg)  08/17/22 165 lb (74.8 kg)  07/21/22 165 lb (74.8 kg)    General Appearance: Morbidly obese, Well nourished well developed, in no apparent distress. Eyes: PERRLA, EOMs, conjunctiva no swelling or erythema ENT/Mouth: Ear canals normal without obstruction,  swelling, erythma, discharge.  TMs normal bilaterally.  Oropharynx moist, clear, without exudate, or postoropharyngeal swelling. Neck: Supple, thyroid normal,no cervical adenopathy  Respiratory: Respiratory effort normal, Breath sounds clear A&P without rhonchi, wheeze, or rale.  No retractions, no accessory usage. Cardio: RRR with no MRGs. Brisk peripheral pulses without edema.  Abdomen: Soft, + BS,  Non tender, no guarding, rebound, hernias, masses. Musculoskeletal: Full ROM, 5/5 strength, Normal gait Skin: Warm, dry without rashes, lesions, ecchymosis.  Neuro: Awake and oriented X 3, Cranial nerves intact. Normal muscle tone, no cerebellar symptoms. Psych: Normal affect, Insight and Judgment appropriate.     [facility]  11/24/2022

## 2022-11-28 ENCOUNTER — Ambulatory Visit: Payer: Managed Care, Other (non HMO) | Admitting: Nurse Practitioner

## 2022-11-28 DIAGNOSIS — E1169 Type 2 diabetes mellitus with other specified complication: Secondary | ICD-10-CM

## 2022-11-28 DIAGNOSIS — K219 Gastro-esophageal reflux disease without esophagitis: Secondary | ICD-10-CM

## 2022-11-28 DIAGNOSIS — E1122 Type 2 diabetes mellitus with diabetic chronic kidney disease: Secondary | ICD-10-CM

## 2022-11-28 DIAGNOSIS — I10 Essential (primary) hypertension: Secondary | ICD-10-CM

## 2022-11-28 DIAGNOSIS — E559 Vitamin D deficiency, unspecified: Secondary | ICD-10-CM

## 2022-11-28 DIAGNOSIS — K760 Fatty (change of) liver, not elsewhere classified: Secondary | ICD-10-CM

## 2022-11-28 DIAGNOSIS — E663 Overweight: Secondary | ICD-10-CM

## 2022-11-28 DIAGNOSIS — Z79899 Other long term (current) drug therapy: Secondary | ICD-10-CM

## 2022-11-28 DIAGNOSIS — G4733 Obstructive sleep apnea (adult) (pediatric): Secondary | ICD-10-CM

## 2022-12-01 ENCOUNTER — Ambulatory Visit: Payer: Managed Care, Other (non HMO) | Admitting: Nurse Practitioner

## 2022-12-07 NOTE — Progress Notes (Unsigned)
FOLLOW UP 3 MONTH  Assessment and Plan:    Essential hypertension Continue current medications: lisinopril 40 mg every day , hydrochlorothiazide 12.5 mg every day  Monitor blood pressure at home; call if consistently over 130/80 Continue DASH diet.   Reminder to go to the ER if any CP, SOB, nausea, dizziness, severe HA, changes vision/speech, left arm numbness and tingling and jaw pain. -     CBC with Differential/Platelet -     CMP   Mixed hyperlipidemia associated with Type 2 DM Continue medications: Atorvastatin 20mg  daily, Vascepa 1g 1 caps every day Discussed dietary and exercise modifications Low fat diet -     Lipid panel  Fatty liver Continue diet, exercise, weight loss and limit Tylenol/Alcohol Use -CMP   Type 2 diabetes mellitus with other diabetic kidney complication, without long-term current use of insulin (HCC) Discussed general issues about diabetes pathophysiology and management., Educational material distributed., Suggested low cholesterol diet., Encouraged aerobic exercise., Discussed foot care., Reminded to get yearly retinal exam.        freestyle libre Continue farxiga 10mg , Metformin 500mg  two tablets BID. Decrease Mounjaro to 10 mg SQ QW due to nausea on 12.5 mg Follow up 3 months - A1c  Overweight with serious comorbidity - follow up 3 months for progress monitoring - increase veggies, decrease carbs - long discussion about weight loss, diet, and exercise - bring food log- continue weight loss-   Gastroesophageal reflux disease without esophagitis Doing well at this time Continue: omprazole 40mg  daily Diet discussed Monitor for triggers Avoid food with high acid content Avoid excessive cafeine Increase water intake  DM Foot Exam Sensation intact No skin breakdown Check feet daily  CKD stage 2 due to Type 2 Diabetes Mellitus Increase fluids, avoid NSAIDS, monitor sugars, will monitor -CMP  Vitamin D deficiency Continue supplementation to  maintain goal of 70-100 Taking Vitamin D 5,000IU daily Defer vitamin D level   Medication management Magnesium   Further disposition pending results if labs check today. Discussed med's effects and SE's.   Over 30 minutes of face to face interview, exam, counseling, chart review, and critical decision making was performed.    HPI 57 y.o. female  presents for 3 month follow up with hypertension, hyperlipidemia, diabetes and vitamin D.  Her blood pressure has been controlled at home, today their BP is BP: 108/78  She is currently on lisinopril 40 mg QD and HCTZ 12.5 mg daily BP Readings from Last 3 Encounters:  12/08/22 108/78  08/17/22 95/68  07/18/22 114/80  She does not workout. She denies chest pain, shortness of breath, dizziness.     BMI is Body mass index is 29.01 kg/m., she is working on diet and exercise. She is currently on Mounjaro 12.5 mg . Highest weight was 270.  Wt Readings from Last 3 Encounters:  12/08/22 166 lb 6.4 oz (75.5 kg)  09/06/22 165 lb (74.8 kg)  08/17/22 165 lb (74.8 kg)    Diabetes with CKD Stage 2 on ACE/ARB:  Lisinopril 40 mg every day  Hyperlipidemia is at goal of less than 70- on lipitor 20mg  and vascepa She is on bASA Farxiga 10 mg every day  Metformin 500 mg 2 tabs BID Mounjaro 12.5 mg currently but just went back to this dose because it had been out of stock, since increasing back to 12.5 mg is having a lot of nausea Denies foot ulcerations, hyperglycemia, hypoglycemia , increased appetite, nausea, paresthesia of the feet, polydipsia, polyuria, visual disturbances, vomiting and  weight loss  Last A1C in the office was:   Lab Results  Component Value Date   HGBA1C 5.6 07/18/2022   Drinking lots of water Lab Results  Component Value Date   EGFR 85 07/18/2022   She is currently on Lipitor 20 mg daily. Vascepa 1 gram capsule every day Her cholesterol is at goal. Lab Results  Component Value Date   CHOL 142 07/18/2022   HDL 49 (L)  07/18/2022   LDLCALC 64 07/18/2022   TRIG 231 (H) 07/18/2022   CHOLHDL 2.9 07/18/2022   She had a follow up sleep study with Dr. Wynona Neat and was found to have no significant sleep apnea and no longer requires CPAP.  GERD symptoms are well controlled on Omeprazole 40 mg QD and diet modifications  Patient is on Vitamin D supplement.  Lab Results  Component Value Date   VD25OH 67 07/18/2022       Current Medications:   Current Outpatient Medications (Endocrine & Metabolic):    dapagliflozin propanediol (FARXIGA) 10 MG TABS tablet, Take 1 tablet (10 mg total) by mouth daily.   metFORMIN (GLUCOPHAGE-XR) 500 MG 24 hr tablet, Take  2 tablets 2 x /day with Meals for Diabetes                                    /                            TAKE                        BY                  MOUTH   tirzepatide (MOUNJARO) 12.5 MG/0.5ML Pen, Inject 12.5 mg into the skin once a week.  Current Outpatient Medications (Cardiovascular):    atorvastatin (LIPITOR) 20 MG tablet, Take  1 tablet  Daily  for Cholesterol   hydrochlorothiazide (HYDRODIURIL) 12.5 MG tablet, Take  1 tablet  Daily  for BP & Fluid Retention /Ankle swelling   icosapent Ethyl (VASCEPA) 1 g capsule, Take 2 capsules 2 x /day with Meals for Triglycerides (Blood Fats) (Patient taking differently: Takes  1 capsules  1x /day  with Meals  for Triglycerides (Blood Fats))   lisinopril (ZESTRIL) 40 MG tablet, Take  1 tablet  Daily for BP & Diabetic Kidney Protection                                         /                   TAKE                     BY                        MOUTH  Current Outpatient Medications (Respiratory):    Fexofenadine HCl (ALLEGRA PO), Take by mouth.  Current Outpatient Medications (Analgesics):    aspirin EC 81 MG tablet, Take 81 mg by mouth daily.   Current Outpatient Medications (Other):    buPROPion (WELLBUTRIN XL) 150 MG 24 hr tablet, Take 1  tablet  every Morning  for Mood, Focus & Concentration  Cholecalciferol (VITAMIN D-3) 5000 UNITS TABS, Take by mouth daily.   docusate sodium (COLACE) 50 MG capsule, Take 50 mg by mouth 2 (two) times daily.   Magnesium 500 MG CAPS, Take 1 capsule by mouth daily.   omeprazole (PRILOSEC) 40 MG capsule, Take 1 capsule by mouth Daily to Prevent Heartburn & Indigestion   ofloxacin (OCUFLOX) 0.3 % ophthalmic solution,   Medical History:  Past Medical History:  Diagnosis Date   Anemia    Colitis 07/11/2012   Colon polyps 07/11/2012   TUBULOVILLOUS ADENOMA   Diabetes mellitus    Exercise-induced asthma    outside asthma    Hernia    Hyperlipidemia    Hypertension    Iron deficiency    Kidney stones    Obesity    OSA on CPAP    Sleep apnea    Vitamin D deficiency     Allergies:  Allergies  Allergen Reactions   Sulfa Antibiotics Other (See Comments)    Stomach hurt   Welchol [Colesevelam Hcl] Hives    HIves     Review of Systems:  Review of Systems  Constitutional:  Negative for chills, fever and malaise/fatigue.  HENT:  Negative for congestion, ear pain and sore throat.   Eyes: Negative.   Respiratory:  Negative for cough, shortness of breath and wheezing.   Cardiovascular:  Negative for chest pain, palpitations and leg swelling.  Gastrointestinal:  Positive for nausea (with 12.5 Mounjaro). Negative for abdominal pain, blood in stool, constipation, diarrhea, heartburn and melena.  Genitourinary: Negative.   Skin: Negative.   Neurological:  Negative for dizziness, sensory change, loss of consciousness and headaches.  Psychiatric/Behavioral:  Negative for depression. The patient is not nervous/anxious and does not have insomnia.     Family history- Review and unchanged  Social history- Review and unchanged  Physical Exam: BP 108/78   Pulse 78   Temp (!) 97.5 F (36.4 C)   Ht 5' 3.5" (1.613 m)   Wt 166 lb 6.4 oz (75.5 kg)   SpO2 98%   BMI 29.01 kg/m  Wt Readings from Last 3 Encounters:  12/08/22 166 lb 6.4 oz (75.5 kg)   09/06/22 165 lb (74.8 kg)  08/17/22 165 lb (74.8 kg)    General Appearance:Well nourished well developed, pleasant female, in no apparent distress. Eyes: PERRLA, EOMs, conjunctiva no swelling or erythema ENT/Mouth: Ear canals normal without obstruction, swelling, erythma, discharge.  TMs normal bilaterally.  Oropharynx moist, clear, without exudate, or postoropharyngeal swelling. Neck: Supple, thyroid normal,no cervical adenopathy  Respiratory: Respiratory effort normal, Breath sounds clear A&P without rhonchi, wheeze, or rale.  No retractions, no accessory usage. Cardio: RRR with no MRGs. Brisk peripheral pulses without edema.  Abdomen: Soft, + BS Musculoskeletal: Full ROM, 5/5 strength, Normal gait Skin: Warm, dry without rashes, lesions, ecchymosis.  Neuro: Awake and oriented X 3, Cranial nerves intact. Normal muscle tone, no cerebellar symptoms.Sensation intact to feet bilaterally with monofilament Psych: Normal affect, Insight and Judgment appropriate.     [facility]  12/08/2022

## 2022-12-08 ENCOUNTER — Encounter: Payer: Self-pay | Admitting: Nurse Practitioner

## 2022-12-08 ENCOUNTER — Ambulatory Visit: Payer: Managed Care, Other (non HMO) | Admitting: Nurse Practitioner

## 2022-12-08 VITALS — BP 108/78 | HR 78 | Temp 97.5°F | Ht 63.5 in | Wt 166.4 lb

## 2022-12-08 DIAGNOSIS — E663 Overweight: Secondary | ICD-10-CM

## 2022-12-08 DIAGNOSIS — E1122 Type 2 diabetes mellitus with diabetic chronic kidney disease: Secondary | ICD-10-CM | POA: Diagnosis not present

## 2022-12-08 DIAGNOSIS — K219 Gastro-esophageal reflux disease without esophagitis: Secondary | ICD-10-CM | POA: Diagnosis not present

## 2022-12-08 DIAGNOSIS — G4733 Obstructive sleep apnea (adult) (pediatric): Secondary | ICD-10-CM

## 2022-12-08 DIAGNOSIS — E1169 Type 2 diabetes mellitus with other specified complication: Secondary | ICD-10-CM

## 2022-12-08 DIAGNOSIS — I10 Essential (primary) hypertension: Secondary | ICD-10-CM | POA: Diagnosis not present

## 2022-12-08 DIAGNOSIS — E785 Hyperlipidemia, unspecified: Secondary | ICD-10-CM

## 2022-12-08 DIAGNOSIS — R110 Nausea: Secondary | ICD-10-CM

## 2022-12-08 DIAGNOSIS — N182 Chronic kidney disease, stage 2 (mild): Secondary | ICD-10-CM

## 2022-12-08 DIAGNOSIS — K760 Fatty (change of) liver, not elsewhere classified: Secondary | ICD-10-CM

## 2022-12-08 DIAGNOSIS — Z79899 Other long term (current) drug therapy: Secondary | ICD-10-CM

## 2022-12-08 DIAGNOSIS — E559 Vitamin D deficiency, unspecified: Secondary | ICD-10-CM

## 2022-12-08 MED ORDER — MOUNJARO 10 MG/0.5ML ~~LOC~~ SOAJ: 10.0000 mg | SUBCUTANEOUS | 0 refills | Status: DC

## 2022-12-08 NOTE — Patient Instructions (Signed)

## 2022-12-09 ENCOUNTER — Encounter: Payer: Self-pay | Admitting: Nurse Practitioner

## 2022-12-09 LAB — CBC WITH DIFFERENTIAL/PLATELET
Absolute Monocytes: 315 cells/uL (ref 200–950)
Basophils Absolute: 68 cells/uL (ref 0–200)
Basophils Relative: 0.9 %
Eosinophils Absolute: 690 cells/uL — ABNORMAL HIGH (ref 15–500)
Eosinophils Relative: 9.2 %
HCT: 44.3 % (ref 35.0–45.0)
Hemoglobin: 14.6 g/dL (ref 11.7–15.5)
Lymphs Abs: 3510 cells/uL (ref 850–3900)
MCH: 27.9 pg (ref 27.0–33.0)
MCHC: 33 g/dL (ref 32.0–36.0)
MCV: 84.7 fL (ref 80.0–100.0)
MPV: 9.6 fL (ref 7.5–12.5)
Monocytes Relative: 4.2 %
Neutro Abs: 2918 cells/uL (ref 1500–7800)
Neutrophils Relative %: 38.9 %
Platelets: 371 10*3/uL (ref 140–400)
RBC: 5.23 10*6/uL — ABNORMAL HIGH (ref 3.80–5.10)
RDW: 13.3 % (ref 11.0–15.0)
Total Lymphocyte: 46.8 %
WBC: 7.5 10*3/uL (ref 3.8–10.8)

## 2022-12-09 LAB — COMPLETE METABOLIC PANEL WITH GFR
AG Ratio: 1.9 (calc) (ref 1.0–2.5)
ALT: 14 U/L (ref 6–29)
AST: 15 U/L (ref 10–35)
Albumin: 4.7 g/dL (ref 3.6–5.1)
Alkaline phosphatase (APISO): 53 U/L (ref 37–153)
BUN: 17 mg/dL (ref 7–25)
CO2: 27 mmol/L (ref 20–32)
Calcium: 10 mg/dL (ref 8.6–10.4)
Chloride: 102 mmol/L (ref 98–110)
Creat: 0.89 mg/dL (ref 0.50–1.03)
Globulin: 2.5 g/dL (calc) (ref 1.9–3.7)
Glucose, Bld: 87 mg/dL (ref 65–99)
Potassium: 4.1 mmol/L (ref 3.5–5.3)
Sodium: 140 mmol/L (ref 135–146)
Total Bilirubin: 0.5 mg/dL (ref 0.2–1.2)
Total Protein: 7.2 g/dL (ref 6.1–8.1)
eGFR: 76 mL/min/{1.73_m2} (ref >=60)

## 2022-12-09 LAB — LIPID PANEL
Cholesterol: 122 mg/dL (ref <200)
HDL: 48 mg/dL — ABNORMAL LOW (ref >=50)
LDL Cholesterol (Calc): 54 mg/dL (calc)
Non-HDL Cholesterol (Calc): 74 mg/dL (calc) (ref <130)
Total CHOL/HDL Ratio: 2.5 (calc) (ref <5.0)
Triglycerides: 115 mg/dL (ref <150)

## 2022-12-09 LAB — HEMOGLOBIN A1C W/OUT EAG: Hgb A1c MFr Bld: 5.7 % of total Hgb — ABNORMAL HIGH (ref <5.7)

## 2023-01-19 ENCOUNTER — Other Ambulatory Visit: Payer: Self-pay | Admitting: Medical Genetics

## 2023-01-19 DIAGNOSIS — Z006 Encounter for examination for normal comparison and control in clinical research program: Secondary | ICD-10-CM

## 2023-02-13 ENCOUNTER — Other Ambulatory Visit (HOSPITAL_COMMUNITY)
Admission: RE | Admit: 2023-02-13 | Discharge: 2023-02-13 | Disposition: A | Discharge status: Home / Self Care | Payer: Managed Care, Other (non HMO) | Source: Ambulatory Visit | Attending: Oncology | Admitting: Oncology

## 2023-02-13 DIAGNOSIS — Z006 Encounter for examination for normal comparison and control in clinical research program: Secondary | ICD-10-CM | POA: Insufficient documentation

## 2023-02-14 ENCOUNTER — Other Ambulatory Visit: Payer: Self-pay | Admitting: Nurse Practitioner

## 2023-02-14 DIAGNOSIS — I10 Essential (primary) hypertension: Secondary | ICD-10-CM

## 2023-02-14 DIAGNOSIS — E1122 Type 2 diabetes mellitus with diabetic chronic kidney disease: Secondary | ICD-10-CM

## 2023-02-14 DIAGNOSIS — F320 Major depressive disorder, single episode, mild: Secondary | ICD-10-CM

## 2023-02-20 ENCOUNTER — Other Ambulatory Visit: Payer: Self-pay | Admitting: Nurse Practitioner

## 2023-02-20 DIAGNOSIS — E1122 Type 2 diabetes mellitus with diabetic chronic kidney disease: Secondary | ICD-10-CM

## 2023-02-26 LAB — GENECONNECT MOLECULAR SCREEN: Genetic Analysis Overall Interpretation: NEGATIVE

## 2023-03-01 ENCOUNTER — Other Ambulatory Visit: Payer: Self-pay | Admitting: Nurse Practitioner

## 2023-03-01 DIAGNOSIS — E785 Hyperlipidemia, unspecified: Secondary | ICD-10-CM

## 2023-03-01 DIAGNOSIS — E1122 Type 2 diabetes mellitus with diabetic chronic kidney disease: Secondary | ICD-10-CM

## 2023-03-07 ENCOUNTER — Other Ambulatory Visit: Payer: Self-pay | Admitting: Nurse Practitioner

## 2023-03-07 DIAGNOSIS — K219 Gastro-esophageal reflux disease without esophagitis: Secondary | ICD-10-CM

## 2023-03-19 ENCOUNTER — Other Ambulatory Visit: Payer: Self-pay | Admitting: Nurse Practitioner

## 2023-03-19 ENCOUNTER — Encounter: Payer: Self-pay | Admitting: Nurse Practitioner

## 2023-03-19 DIAGNOSIS — E1169 Type 2 diabetes mellitus with other specified complication: Secondary | ICD-10-CM

## 2023-03-19 DIAGNOSIS — E1122 Type 2 diabetes mellitus with diabetic chronic kidney disease: Secondary | ICD-10-CM

## 2023-03-19 MED ORDER — MOUNJARO 10 MG/0.5ML ~~LOC~~ SOAJ
10.0000 mg | SUBCUTANEOUS | 2 refills | Status: AC
Start: 2023-03-19 — End: ?

## 2023-03-19 MED ORDER — MOUNJARO 10 MG/0.5ML ~~LOC~~ SOAJ: 10.0000 mg | SUBCUTANEOUS | 2 refills | Status: DC

## 2023-03-20 NOTE — Progress Notes (Deleted)
 FOLLOW UP 3 MONTH  Assessment and Plan:    Essential hypertension Continue current medications: lisinopril  40 mg every day , hydrochlorothiazide  12.5 mg every day  Monitor blood pressure at home; call if consistently over 130/80 Continue DASH diet.   Reminder to go to the ER if any CP, SOB, nausea, dizziness, severe HA, changes vision/speech, left arm numbness and tingling and jaw pain. -     CBC with Differential/Platelet -     CMP   Mixed hyperlipidemia associated with Type 2 DM Continue medications: Atorvastatin  20mg  daily, Vascepa  1g 1 caps every day Discussed dietary and exercise modifications Low fat diet -     Lipid panel  Fatty liver Continue diet, exercise, weight loss and limit Tylenol /Alcohol Use -CMP   Type 2 diabetes mellitus with other diabetic kidney complication, without long-term current use of insulin  (HCC) Discussed general issues about diabetes pathophysiology and management., Educational material distributed., Suggested low cholesterol diet., Encouraged aerobic exercise., Discussed foot care., Reminded to get yearly retinal exam.        freestyle libre Continue farxiga  10mg , Metformin  500mg  two tablets BID. Decrease Mounjaro  to 10 mg SQ QW due to nausea on 12.5 mg Follow up 3 months - A1c  Overweight with serious comorbidity - follow up 3 months for progress monitoring - increase veggies, decrease carbs - long discussion about weight loss, diet, and exercise - bring food log- continue weight loss-   Gastroesophageal reflux disease without esophagitis Doing well at this time Continue: omprazole 40mg  daily Diet discussed Monitor for triggers Avoid food with high acid content Avoid excessive cafeine Increase water  intake   CKD stage 2 due to Type 2 Diabetes Mellitus Increase fluids, avoid NSAIDS, monitor sugars, will monitor -CMP  Vitamin D  deficiency Continue supplementation to maintain goal of 70-100 Taking Vitamin D  5,000IU daily Defer  vitamin D  level   Medication management Magnesium   Further disposition pending results if labs check today. Discussed med's effects and SE's.   Over 30 minutes of face to face interview, exam, counseling, chart review, and critical decision making was performed.    HPI 57 y.o. female  presents for 3 month follow up with hypertension, hyperlipidemia, diabetes and vitamin D .  Her blood pressure has been controlled at home, today their BP is    She is currently on lisinopril  40 mg QD and HCTZ 12.5 mg daily BP Readings from Last 3 Encounters:  12/08/22 108/78  08/17/22 95/68  07/18/22 114/80  She does not workout. She denies chest pain, shortness of breath, dizziness.     BMI is There is no height or weight on file to calculate BMI., she is working on diet and exercise. She is currently on Mounjaro  12.5 mg . Highest weight was 270.  Wt Readings from Last 3 Encounters:  12/08/22 166 lb 6.4 oz (75.5 kg)  09/06/22 165 lb (74.8 kg)  08/17/22 165 lb (74.8 kg)    Diabetes with CKD Stage 2 on ACE/ARB:  Lisinopril  40 mg every day  Hyperlipidemia is at goal of less than 70- on lipitor 20mg  and vascepa  She is on bASA Farxiga  10 mg every day  Metformin  500 mg 2 tabs BID Mounjaro  12.5 mg currently but just went back to this dose because it had been out of stock, since increasing back to 12.5 mg is having a lot of nausea Denies foot ulcerations, hyperglycemia, hypoglycemia , increased appetite, nausea, paresthesia of the feet, polydipsia, polyuria, visual disturbances, vomiting and weight loss  Last A1C in  the office was:   Lab Results  Component Value Date   HGBA1C 5.7 (H) 12/08/2022   Drinking lots of water  Lab Results  Component Value Date   EGFR 76 12/08/2022   She is currently on Lipitor 20 mg daily. Vascepa  1 gram capsule every day Her cholesterol is at goal. Lab Results  Component Value Date   CHOL 122 12/08/2022   HDL 48 (L) 12/08/2022   LDLCALC 54 12/08/2022   TRIG 115  12/08/2022   CHOLHDL 2.5 12/08/2022   She had a follow up sleep study with Dr. Neda and was found to have no significant sleep apnea and no longer requires CPAP.  GERD symptoms are well controlled on Omeprazole  40 mg QD and diet modifications  Patient is on Vitamin D  supplement.  Lab Results  Component Value Date   VD25OH 67 07/18/2022       Current Medications:   Current Outpatient Medications (Endocrine & Metabolic):    FARXIGA  10 MG TABS tablet, TAKE 1 TABLET BY MOUTH EVERY DAY   metFORMIN  (GLUCOPHAGE -XR) 500 MG 24 hr tablet, TAKE 2 TABLETS 2 X /DAY WITH MEALS FOR DIABETES / TAKE BY MOUTH   tirzepatide  (MOUNJARO ) 10 MG/0.5ML Pen, Inject 10 mg into the skin once a week.  Current Outpatient Medications (Cardiovascular):    atorvastatin  (LIPITOR) 20 MG tablet, Take  1 tablet  Daily  for Cholesterol   hydrochlorothiazide  (HYDRODIURIL ) 12.5 MG tablet, TAKE 1 TABLET DAILY FOR BLOOD PRESSURE & FLUID RETENTION PARNELL SWELLING   icosapent  Ethyl (VASCEPA ) 1 g capsule, Take 2 capsules 2 x /day with Meals for Triglycerides (Blood Fats) (Patient taking differently: Takes  1 capsules  1x /day  with Meals  for Triglycerides (Blood Fats))   lisinopril  (ZESTRIL ) 40 MG tablet, TAKE 1 TABLET DAILY FOR BLOOD PRESSURE & DIABETIC KIDNEY PROTECTION  Current Outpatient Medications (Respiratory):    Fexofenadine HCl (ALLEGRA PO), Take by mouth.  Current Outpatient Medications (Analgesics):    aspirin EC 81 MG tablet, Take 81 mg by mouth daily.   Current Outpatient Medications (Other):    buPROPion  (WELLBUTRIN  XL) 150 MG 24 hr tablet, TAKE 1 TABLET EVERY MORNING FOR MOOD, FOCUS & CONCENTRATION   Cholecalciferol (VITAMIN D -3) 5000 UNITS TABS, Take by mouth daily.   docusate sodium (COLACE) 50 MG capsule, Take 50 mg by mouth 2 (two) times daily.   Magnesium 500 MG CAPS, Take 1 capsule by mouth daily.   ofloxacin (OCUFLOX) 0.3 % ophthalmic solution,    omeprazole  (PRILOSEC) 40 MG capsule, TAKE ONE  CAPSULE BY MOUTH ONCE A DAY TO PREVENT HEARTBURN AND INDIGESTION  Medical History:  Past Medical History:  Diagnosis Date   Anemia    Colitis 07/11/2012   Colon polyps 07/11/2012   TUBULOVILLOUS ADENOMA   Diabetes mellitus    Exercise-induced asthma    outside asthma    Hernia    Hyperlipidemia    Hypertension    Iron deficiency    Kidney stones    Obesity    OSA on CPAP    Sleep apnea    Vitamin D  deficiency     Allergies:  Allergies  Allergen Reactions   Sulfa  Antibiotics Other (See Comments)    Stomach hurt   Welchol [Colesevelam Hcl] Hives    HIves     Review of Systems:  Review of Systems  Constitutional:  Negative for chills, fever and malaise/fatigue.  HENT:  Negative for congestion, ear pain and sore throat.   Eyes: Negative.   Respiratory:  Negative for cough, shortness of breath and wheezing.   Cardiovascular:  Negative for chest pain, palpitations and leg swelling.  Gastrointestinal:  Positive for nausea (with 12.5 Mounjaro ). Negative for abdominal pain, blood in stool, constipation, diarrhea, heartburn and melena.  Genitourinary: Negative.   Skin: Negative.   Neurological:  Negative for dizziness, sensory change, loss of consciousness and headaches.  Psychiatric/Behavioral:  Negative for depression. The patient is not nervous/anxious and does not have insomnia.     Family history- Review and unchanged  Social history- Review and unchanged  Physical Exam: There were no vitals taken for this visit. Wt Readings from Last 3 Encounters:  12/08/22 166 lb 6.4 oz (75.5 kg)  09/06/22 165 lb (74.8 kg)  08/17/22 165 lb (74.8 kg)    General Appearance:Well nourished well developed, pleasant female, in no apparent distress. Eyes: PERRLA, EOMs, conjunctiva no swelling or erythema ENT/Mouth: Ear canals normal without obstruction, swelling, erythma, discharge.  TMs normal bilaterally.  Oropharynx moist, clear, without exudate, or postoropharyngeal  swelling. Neck: Supple, thyroid  normal,no cervical adenopathy  Respiratory: Respiratory effort normal, Breath sounds clear A&P without rhonchi, wheeze, or rale.  No retractions, no accessory usage. Cardio: RRR with no MRGs. Brisk peripheral pulses without edema.  Abdomen: Soft, + BS Musculoskeletal: Full ROM, 5/5 strength, Normal gait Skin: Warm, dry without rashes, lesions, ecchymosis.  Neuro: Awake and oriented X 3, Cranial nerves intact. Normal muscle tone, no cerebellar symptoms.Sensation intact to feet bilaterally with monofilament Psych: Normal affect, Insight and Judgment appropriate.     Unity Luepke E. Rielle Schlauch ANP-C  [facility]  03/20/2023

## 2023-03-21 ENCOUNTER — Ambulatory Visit: Payer: Managed Care, Other (non HMO) | Admitting: Nurse Practitioner

## 2023-04-03 ENCOUNTER — Ambulatory Visit: Payer: Managed Care, Other (non HMO) | Admitting: Nurse Practitioner

## 2023-04-09 NOTE — Progress Notes (Deleted)
FOLLOW UP 3 MONTH  Assessment and Plan:    Essential hypertension Continue current medications: lisinopril 40 mg every day , hydrochlorothiazide 12.5 mg every day  Monitor blood pressure at home; call if consistently over 130/80 Continue DASH diet.   Reminder to go to the ER if any CP, SOB, nausea, dizziness, severe HA, changes vision/speech, left arm numbness and tingling and jaw pain. -     CBC with Differential/Platelet -     CMP   Mixed hyperlipidemia associated with Type 2 DM Continue medications: Atorvastatin 20mg  daily, Vascepa 1g 1 caps every day Discussed dietary and exercise modifications Low fat diet -     Lipid panel  Fatty liver Continue diet, exercise, weight loss and limit Tylenol/Alcohol Use -CMP   Type 2 diabetes mellitus with other diabetic kidney complication, without long-term current use of insulin (HCC) Discussed general issues about diabetes pathophysiology and management., Educational material distributed., Suggested low cholesterol diet., Encouraged aerobic exercise., Discussed foot care., Reminded to get yearly retinal exam.        freestyle libre Continue farxiga 10mg , Metformin 500mg  two tablets BID. Decrease Mounjaro to 10 mg SQ QW due to nausea on 12.5 mg Follow up 3 months - A1c  Overweight with serious comorbidity - follow up 3 months for progress monitoring - increase veggies, decrease carbs - long discussion about weight loss, diet, and exercise - bring food log- continue weight loss-   Gastroesophageal reflux disease without esophagitis Doing well at this time Continue: omprazole 40mg  daily Diet discussed Monitor for triggers Avoid food with high acid content Avoid excessive cafeine Increase water intake   CKD stage 2 due to Type 2 Diabetes Mellitus Increase fluids, avoid NSAIDS, monitor sugars, will monitor -CMP  Vitamin D deficiency Continue supplementation to maintain goal of 70-100 Taking Vitamin D 5,000IU daily Defer  vitamin D level   Medication management Magnesium   Further disposition pending results if labs check today. Discussed med's effects and SE's.   Over 30 minutes of face to face interview, exam, counseling, chart review, and critical decision making was performed.    HPI 58 y.o. female  presents for 3 month follow up with hypertension, hyperlipidemia, diabetes and vitamin D.  Her blood pressure has been controlled at home, today their BP is    She is currently on lisinopril 40 mg QD and HCTZ 12.5 mg daily BP Readings from Last 3 Encounters:  12/08/22 108/78  08/17/22 95/68  07/18/22 114/80  She does not workout. She denies chest pain, shortness of breath, dizziness.     BMI is There is no height or weight on file to calculate BMI., she is working on diet and exercise. She is currently on Mounjaro 12.5 mg . Highest weight was 270.  Wt Readings from Last 3 Encounters:  12/08/22 166 lb 6.4 oz (75.5 kg)  09/06/22 165 lb (74.8 kg)  08/17/22 165 lb (74.8 kg)    Diabetes with CKD Stage 2 on ACE/ARB:  Lisinopril 40 mg every day  Hyperlipidemia is at goal of less than 70- on lipitor 20mg  and vascepa She is on bASA Farxiga 10 mg every day  Metformin 500 mg 2 tabs BID Mounjaro 12.5 mg currently but just went back to this dose because it had been out of stock, since increasing back to 12.5 mg is having a lot of nausea Denies foot ulcerations, hyperglycemia, hypoglycemia , increased appetite, nausea, paresthesia of the feet, polydipsia, polyuria, visual disturbances, vomiting and weight loss  Last A1C in  the office was:   Lab Results  Component Value Date   HGBA1C 5.7 (H) 12/08/2022   Drinking lots of water Lab Results  Component Value Date   EGFR 76 12/08/2022   She is currently on Lipitor 20 mg daily. Vascepa 1 gram capsule every day Her cholesterol is at goal. Lab Results  Component Value Date   CHOL 122 12/08/2022   HDL 48 (L) 12/08/2022   LDLCALC 54 12/08/2022   TRIG 115  12/08/2022   CHOLHDL 2.5 12/08/2022   She had a follow up sleep study with Dr. Wynona Neat and was found to have no significant sleep apnea and no longer requires CPAP.  GERD symptoms are well controlled on Omeprazole 40 mg QD and diet modifications  Patient is on Vitamin D supplement.  Lab Results  Component Value Date   VD25OH 67 07/18/2022       Current Medications:   Current Outpatient Medications (Endocrine & Metabolic):    FARXIGA 10 MG TABS tablet, TAKE 1 TABLET BY MOUTH EVERY DAY   metFORMIN (GLUCOPHAGE-XR) 500 MG 24 hr tablet, TAKE 2 TABLETS 2 X /DAY WITH MEALS FOR DIABETES / TAKE BY MOUTH   tirzepatide (MOUNJARO) 10 MG/0.5ML Pen, Inject 10 mg into the skin once a week.  Current Outpatient Medications (Cardiovascular):    atorvastatin (LIPITOR) 20 MG tablet, Take  1 tablet  Daily  for Cholesterol   hydrochlorothiazide (HYDRODIURIL) 12.5 MG tablet, TAKE 1 TABLET DAILY FOR BLOOD PRESSURE & FLUID RETENTION Lynnda Child SWELLING   icosapent Ethyl (VASCEPA) 1 g capsule, Take 2 capsules 2 x /day with Meals for Triglycerides (Blood Fats) (Patient taking differently: Takes  1 capsules  1x /day  with Meals  for Triglycerides (Blood Fats))   lisinopril (ZESTRIL) 40 MG tablet, TAKE 1 TABLET DAILY FOR BLOOD PRESSURE & DIABETIC KIDNEY PROTECTION  Current Outpatient Medications (Respiratory):    Fexofenadine HCl (ALLEGRA PO), Take by mouth.  Current Outpatient Medications (Analgesics):    aspirin EC 81 MG tablet, Take 81 mg by mouth daily.   Current Outpatient Medications (Other):    buPROPion (WELLBUTRIN XL) 150 MG 24 hr tablet, TAKE 1 TABLET EVERY MORNING FOR MOOD, FOCUS & CONCENTRATION   Cholecalciferol (VITAMIN D-3) 5000 UNITS TABS, Take by mouth daily.   docusate sodium (COLACE) 50 MG capsule, Take 50 mg by mouth 2 (two) times daily.   Magnesium 500 MG CAPS, Take 1 capsule by mouth daily.   ofloxacin (OCUFLOX) 0.3 % ophthalmic solution,    omeprazole (PRILOSEC) 40 MG capsule, TAKE ONE  CAPSULE BY MOUTH ONCE A DAY TO PREVENT HEARTBURN AND INDIGESTION  Medical History:  Past Medical History:  Diagnosis Date   Anemia    Colitis 07/11/2012   Colon polyps 07/11/2012   TUBULOVILLOUS ADENOMA   Diabetes mellitus    Exercise-induced asthma    outside asthma    Hernia    Hyperlipidemia    Hypertension    Iron deficiency    Kidney stones    Obesity    OSA on CPAP    Sleep apnea    Vitamin D deficiency     Allergies:  Allergies  Allergen Reactions   Sulfa Antibiotics Other (See Comments)    Stomach hurt   Welchol [Colesevelam Hcl] Hives    HIves     Review of Systems:  Review of Systems  Constitutional:  Negative for chills, fever and malaise/fatigue.  HENT:  Negative for congestion, ear pain and sore throat.   Eyes: Negative.   Respiratory:  Negative for cough, shortness of breath and wheezing.   Cardiovascular:  Negative for chest pain, palpitations and leg swelling.  Gastrointestinal:  Positive for nausea (with 12.5 Mounjaro). Negative for abdominal pain, blood in stool, constipation, diarrhea, heartburn and melena.  Genitourinary: Negative.   Skin: Negative.   Neurological:  Negative for dizziness, sensory change, loss of consciousness and headaches.  Psychiatric/Behavioral:  Negative for depression. The patient is not nervous/anxious and does not have insomnia.     Family history- Review and unchanged  Social history- Review and unchanged  Physical Exam: There were no vitals taken for this visit. Wt Readings from Last 3 Encounters:  12/08/22 166 lb 6.4 oz (75.5 kg)  09/06/22 165 lb (74.8 kg)  08/17/22 165 lb (74.8 kg)    General Appearance:Well nourished well developed, pleasant female, in no apparent distress. Eyes: PERRLA, EOMs, conjunctiva no swelling or erythema ENT/Mouth: Ear canals normal without obstruction, swelling, erythma, discharge.  TMs normal bilaterally.  Oropharynx moist, clear, without exudate, or postoropharyngeal  swelling. Neck: Supple, thyroid normal,no cervical adenopathy  Respiratory: Respiratory effort normal, Breath sounds clear A&P without rhonchi, wheeze, or rale.  No retractions, no accessory usage. Cardio: RRR with no MRGs. Brisk peripheral pulses without edema.  Abdomen: Soft, + BS Musculoskeletal: Full ROM, 5/5 strength, Normal gait Skin: Warm, dry without rashes, lesions, ecchymosis.  Neuro: Awake and oriented X 3, Cranial nerves intact. Normal muscle tone, no cerebellar symptoms.Sensation intact to feet bilaterally with monofilament Psych: Normal affect, Insight and Judgment appropriate.     [facility]  04/09/2023

## 2023-04-10 ENCOUNTER — Ambulatory Visit: Payer: Managed Care, Other (non HMO) | Admitting: Nurse Practitioner

## 2023-04-10 DIAGNOSIS — I10 Essential (primary) hypertension: Secondary | ICD-10-CM

## 2023-04-10 DIAGNOSIS — E663 Overweight: Secondary | ICD-10-CM

## 2023-04-10 DIAGNOSIS — E559 Vitamin D deficiency, unspecified: Secondary | ICD-10-CM

## 2023-04-10 DIAGNOSIS — K219 Gastro-esophageal reflux disease without esophagitis: Secondary | ICD-10-CM

## 2023-04-10 DIAGNOSIS — E1169 Type 2 diabetes mellitus with other specified complication: Secondary | ICD-10-CM

## 2023-04-10 DIAGNOSIS — Z79899 Other long term (current) drug therapy: Secondary | ICD-10-CM

## 2023-04-10 DIAGNOSIS — E1122 Type 2 diabetes mellitus with diabetic chronic kidney disease: Secondary | ICD-10-CM

## 2023-04-10 DIAGNOSIS — K760 Fatty (change of) liver, not elsewhere classified: Secondary | ICD-10-CM

## 2023-04-11 ENCOUNTER — Other Ambulatory Visit: Payer: Self-pay | Admitting: Nurse Practitioner

## 2023-04-11 DIAGNOSIS — E1169 Type 2 diabetes mellitus with other specified complication: Secondary | ICD-10-CM

## 2023-05-08 ENCOUNTER — Ambulatory Visit: Payer: Managed Care, Other (non HMO) | Admitting: Nurse Practitioner

## 2023-05-23 ENCOUNTER — Other Ambulatory Visit: Payer: Self-pay

## 2023-05-23 DIAGNOSIS — I10 Essential (primary) hypertension: Secondary | ICD-10-CM

## 2023-05-23 MED ORDER — HYDROCHLOROTHIAZIDE 12.5 MG PO TABS: ORAL_TABLET | ORAL | 0 refills | Status: AC

## 2023-05-26 ENCOUNTER — Other Ambulatory Visit (INDEPENDENT_AMBULATORY_CARE_PROVIDER_SITE_OTHER): Payer: Self-pay

## 2023-05-26 ENCOUNTER — Ambulatory Visit: Admitting: Physician Assistant

## 2023-05-26 ENCOUNTER — Encounter: Payer: Self-pay | Admitting: Physician Assistant

## 2023-05-26 DIAGNOSIS — M25512 Pain in left shoulder: Secondary | ICD-10-CM

## 2023-05-26 DIAGNOSIS — M5010 Cervical disc disorder with radiculopathy, unspecified cervical region: Secondary | ICD-10-CM | POA: Insufficient documentation

## 2023-05-26 MED ORDER — METHYLPREDNISOLONE 4 MG PO TBPK: ORAL_TABLET | ORAL | 0 refills | Status: AC

## 2023-05-26 NOTE — Progress Notes (Signed)
 Office Visit Note   Patient: Jenny Singleton           Date of Birth: 02-Jan-1966           MRN: [id number] Visit Date: 05/26/2023              Requested by: Lucky Cowboy, MD [address] [place],  [place] [address] PCP: Lucky Cowboy, MD   Assessment & Plan: Visit Diagnoses:  1. Left shoulder pain, unspecified chronicity     Plan: Pleasant 58 year old woman comes in today with a 3-day history of left-sided neck tightness radiating down into her scapula.  She also gets some going down her arm with a little bit of weakness.  She was concerned that it might be a shoulder issue based on exam and x-rays I think this is coming from her neck.  She has had neck issues in the past.  I recommend a steroid Dosepak.  I told her about the side effects and how to take this.  She did not have any particular injury she just slept wrong.  I am hopeful this will take care of the problem if not she will contact me could consider physical therapy and/or an MRI  Follow-Up Instructions: Return if symptoms worsen or fail to improve.   Orders:  Orders Placed This Encounter  Procedures   XR Cervical Spine 2 or 3 views   XR Shoulder Left   Meds ordered this encounter  Medications   methylPREDNISolone (MEDROL DOSEPAK) 4 MG TBPK tablet    Sig: Take as directed with food    Dispense:  21 tablet    Refill:  0      Procedures: No procedures performed   Clinical Data: No additional findings.   Subjective: Chief Complaint  Patient presents with   Left Shoulder - Pain   Neck - Pain    HPI Patient is a pleasant 58 year old woman who is complaining of left shoulder and neck pain for 3 days she says she woke up in pain no known injury radiates into the upper arm she described as left-sided neck and scapular pain think she has some weakness in the left arm she is tried heat Biofreeze and lidocaine botched as well as massage she is right-hand dominant  Review of Systems  All  other systems reviewed and are negative.    Objective: Vital Signs: There were no vitals taken for this visit.  Physical Exam Constitutional:      Appearance: Normal appearance.  Pulmonary:     Effort: Pulmonary effort is normal.  Skin:    General: Skin is warm and dry.  Neurological:     Mental Status: She is alert.  Psychiatric:        Mood and Affect: Mood normal.        Behavior: Behavior normal.     Ortho Exam Examination of her neck she has quite a bit of stiffness with flexion and extension turning side-to-side is not too bad.  She has good shoulder range of motion no impingement findings no pain in her shoulder itself.  She has slight weakness in the left grip strength.  She has good biceps and triceps strength good abductors no step-offs on palpation over cervical spine Specialty Comments:  No specialty comments available.  Imaging: XR Cervical Spine 2 or 3 views Result Date: 05/26/2023 Multiple degenerative changes of the cervical spine C3-4 C4-5 no acute fractures  XR Shoulder Left Result Date: 05/26/2023 Graphs of her left shoulder  she does have some early degenerative changes of the [facility] joint glenohumeral joint well-placed no evidence of any significant arthropathy    PMFS History: Patient Active Problem List   Diagnosis Date Noted   Pain in left shoulder 05/26/2023   Cervical disc disorder with radiculopathy, unspecified cervical region 05/26/2023   Type 2 diabetes mellitus with stage 2 chronic kidney disease, without long-term current use of insulin (HCC) 05/07/2019   Hyperlipidemia associated with type 2 diabetes mellitus (HCC) 01/03/2018   Vitamin D deficiency 01/03/2018   Medication management 01/03/2018   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) 09/25/2014   Transaminitis 07/10/2012   Fatty liver 07/10/2012   Iron deficiency anemia 07/10/2012   Morbid obesity (HCC) 09/02/2010   OBSTRUCTIVE SLEEP APNEA 06/12/2007   Exercise induced bronchospasm 06/12/2007    Essential hypertension 12/01/2006   Past Medical History:  Diagnosis Date   Anemia    Colitis 07/11/2012   Colon polyps 07/11/2012   TUBULOVILLOUS ADENOMA   Diabetes mellitus    Exercise-induced asthma    outside asthma    Hernia    Hyperlipidemia    Hypertension    Iron deficiency    Kidney stones    Obesity    OSA on CPAP    Sleep apnea    Vitamin D deficiency     Family History  Adopted: Yes  Problem Relation Age of Onset   Colon cancer Father 6   Colon polyps Neg Hx    Esophageal cancer Neg Hx    Rectal cancer Neg Hx    Stomach cancer Neg Hx     Past Surgical History:  Procedure Laterality Date   ABDOMINOPLASTY     ABLATION     COLONOSCOPY     COLONOSCOPY W/ BIOPSIES  07/11/2012   ESOPHAGOGASTRODUODENOSCOPY  07/11/2012   Normal    HERNIA REPAIR     2017, has had repaired x 3, [facility]   TONSILECTOMY, ADENOIDECTOMY, BILATERAL MYRINGOTOMY AND TUBES     Social History   Occupational History    Employer: [employer]   Tobacco Use   Smoking status: Former    Current packs/day: 0.00    Average packs/day: 1 pack/day for 2.0 years (2.0 ttl pk-yrs)    Types: Cigarettes    Start date: 03/22/1983    Quit date: 03/21/1985    Years since quitting: 38.2   Smokeless tobacco: Never   Tobacco comments:    smoked age [age]  Vaping Use   Vaping status: Never Used  Substance and Sexual Activity   Alcohol use: Yes    Comment: rare   Drug use: No   Sexual activity: Yes    Birth control/protection: None

## 2023-05-30 ENCOUNTER — Other Ambulatory Visit (INDEPENDENT_AMBULATORY_CARE_PROVIDER_SITE_OTHER): Payer: Self-pay

## 2023-05-30 ENCOUNTER — Ambulatory Visit: Admitting: Orthopedic Surgery

## 2023-05-30 DIAGNOSIS — M67441 Ganglion, right hand: Secondary | ICD-10-CM

## 2023-05-30 NOTE — Progress Notes (Signed)
 Jenny Singleton - 58 y.o. female MRN [id number]  Date of birth: 12-Aug-1965  Office Visit Note: Visit Date: 05/30/2023 PCP: Lucky Cowboy, MD Referred by: Lucky Cowboy, MD  Subjective: No chief complaint on file.  HPI: Jenny Singleton is a pleasant 58 y.o. female who presents today for evaluation of a right long finger mucous cyst over the DIP joint that has been present now for greater than 5 years.  She estimates that approximately 5 years prior, she underwent mucous cyst excision at an alternative facility.  She states that over the past year or so, symptoms have recurred from a pain standpoint as well as the mass over the dorsal ulnar aspect of the DIP joint.  She is diabetic, recent A1c was 5.7.    Pertinent ROS were reviewed with the patient and found to be negative unless otherwise specified above in HPI.   Visit Reason: right long finger mucous cyst Duration of symptoms: years Hand dominance: right Occupation: HR director Diabetic: Yes/ 5.7 Smoking: No  Heart/Lung History: none Blood Thinners: none  Prior Testing/EMG: none Injections (Date): none Treatments: previous cyst excision Prior Surgery: surgery 5 years ago  Assessment & Plan: Visit Diagnoses:  1. 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  X-rays were obtained today which show significant arthritis at the DIP joint with associated dorsal osteophyte formation.  I explained the underlying etiology and pathophysiology of the mucous cyst as well as the ongoing arthritis in the DIP joint which is likely led to the cyst recurrence.  We discussed treatment modalities ranging from conservative to surgical.  From a conservative standpoint, we discussed ongoing observation, compression wrapping and activity modification as well as over-the-counter medication.   From a surgical standpoint, we discussed repeat cyst excision with DIP joint debridement versus possible DIP joint fusion to address the underlying arthritis.  We also discussed risks and benefits of the above-mentioned procedures as well as the standard postoperative protocols.  Understand the above, she would like to move forward with right long finger mucous cyst excision with DIP joint debridement.  Risks and benefits of the procedure were discussed, risks including but not limited to infection, bleeding, scarring, stiffness, nerve injury, tendon injury, vascular injury, recurrence of symptoms and need for subsequent operation.  We also discussed forms of anesthesia, local versus sedation.  She is interested in learning more about potential cost associated with local versus sedation from a scheduling standpoint, my office will contact her with specifics.  When she has this information, we can move forward with surgical scheduling.  Patient expressed understanding.     Follow-up: No follow-ups on file.   Meds & Orders: No orders of the defined types were placed in this encounter.   Orders Placed This Encounter  Procedures   XR Hand Complete Right     Procedures: No procedures performed      Clinical History: No specialty comments available.  She reports that she quit smoking about 38 years ago. Her smoking use included cigarettes. She started smoking about 40 years ago. She has a 2 pack-year smoking history. She has never used smokeless tobacco.  Recent Labs    07/18/22 1622 12/08/22 1612  HGBA1C 5.6 5.7*    Objective:   Vital Signs: There were no vitals taken for this visit.  Physical Exam  Gen: Well-appearing,  in no acute distress; non-toxic CV: Regular Rate. Well-perfused. Warm.  Resp: Breathing unlabored on room air; no wheezing. Psych: Fluid speech in conversation; appropriate affect; normal thought process  Ortho Exam Right long finger: - Notable deviation at the DIP  joint, slight radial deviation, osteophyte is present, tenderness associated over the dorsal ulnar aspect of the DIP joint, mass is present approximately 0.5 cm x 0.5 cm with associated tenderness - DIP range of motion 0-25 - PIP range of motion 0-100 - Digit is warm well-perfused, capillary refill is appropriate, sensation intact distally  Imaging: XR Hand Complete Right Result Date: 05/30/2023 X-rays of the right hand, multiple views were obtained X-rays demonstrate significant degenerative changes at the right long finger DIP joint with osteophyte formation and joint space narrowing.  There is also observed degenerative changes seen at the index finger DIP joint, also with joint space narrowing.   Past Medical/Family/Surgical/Social History: Medications & Allergies reviewed per EMR, new medications updated. Patient Active Problem List   Diagnosis Date Noted   Pain in left shoulder 05/26/2023   Cervical disc disorder with radiculopathy, unspecified cervical region 05/26/2023   Type 2 diabetes mellitus with stage 2 chronic kidney disease, without long-term current use of insulin (HCC) 05/07/2019   Hyperlipidemia associated with type 2 diabetes mellitus (HCC) 01/03/2018   Vitamin D deficiency 01/03/2018   Medication management 01/03/2018   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) 09/25/2014   Transaminitis 07/10/2012   Fatty liver 07/10/2012   Iron deficiency anemia 07/10/2012   Morbid obesity (HCC) 09/02/2010   OBSTRUCTIVE SLEEP APNEA 06/12/2007   Exercise induced bronchospasm 06/12/2007   Essential hypertension 12/01/2006   Past Medical History:  Diagnosis Date   Anemia    Colitis 07/11/2012   Colon polyps 07/11/2012   TUBULOVILLOUS ADENOMA   Diabetes mellitus    Exercise-induced asthma    outside asthma    Hernia    Hyperlipidemia    Hypertension    Iron deficiency    Kidney stones    Obesity    OSA on CPAP    Sleep apnea    Vitamin D deficiency    Family History  Adopted:  Yes  Problem Relation Age of Onset   Colon cancer Father 15   Colon polyps Neg Hx    Esophageal cancer Neg Hx    Rectal cancer Neg Hx    Stomach cancer Neg Hx    Past Surgical History:  Procedure Laterality Date   ABDOMINOPLASTY     ABLATION     COLONOSCOPY     COLONOSCOPY W/ BIOPSIES  07/11/2012   ESOPHAGOGASTRODUODENOSCOPY  07/11/2012   Normal    HERNIA REPAIR     2017, has had repaired x 3, [facility]   TONSILECTOMY, ADENOIDECTOMY, BILATERAL MYRINGOTOMY AND TUBES     Social History   Occupational History    Employer: [employer]   Tobacco Use   Smoking status: Former    Current packs/day: 0.00    Average packs/day: 1 pack/day for 2.0 years (2.0 ttl pk-yrs)    Types: Cigarettes    Start date: 03/22/1983    Quit date: 03/21/1985    Years since quitting: 38.2   Smokeless tobacco: Never   Tobacco comments:    smoked age [age]  Vaping Use   Vaping status: Never Used  Substance and Sexual Activity   Alcohol use: Yes    Comment: rare   Drug use: No   Sexual activity: Yes    Birth control/protection:  None    Odeth Bry Fara Boros) Denese Killings, M.D. [facility], Hand Surgery

## 2023-07-17 ENCOUNTER — Encounter: Payer: Managed Care, Other (non HMO) | Admitting: Nurse Practitioner

## 2023-07-18 IMAGING — MG MM DIGITAL SCREENING BILAT W/ TOMO AND CAD
8 series · 8 of 24 positions shown · non-contrast
Comparison: Previous exam(s).

CLINICAL DATA: Screening.

EXAM:
DIGITAL SCREENING BILATERAL MAMMOGRAM WITH TOMOSYNTHESIS AND CAD
TECHNIQUE: Bilateral screening digital craniocaudal and mediolateral oblique
mammograms were obtained. Bilateral screening digital breast
tomosynthesis was performed. The images were evaluated with
computer-aided detection.

[R MLO synth-2D]
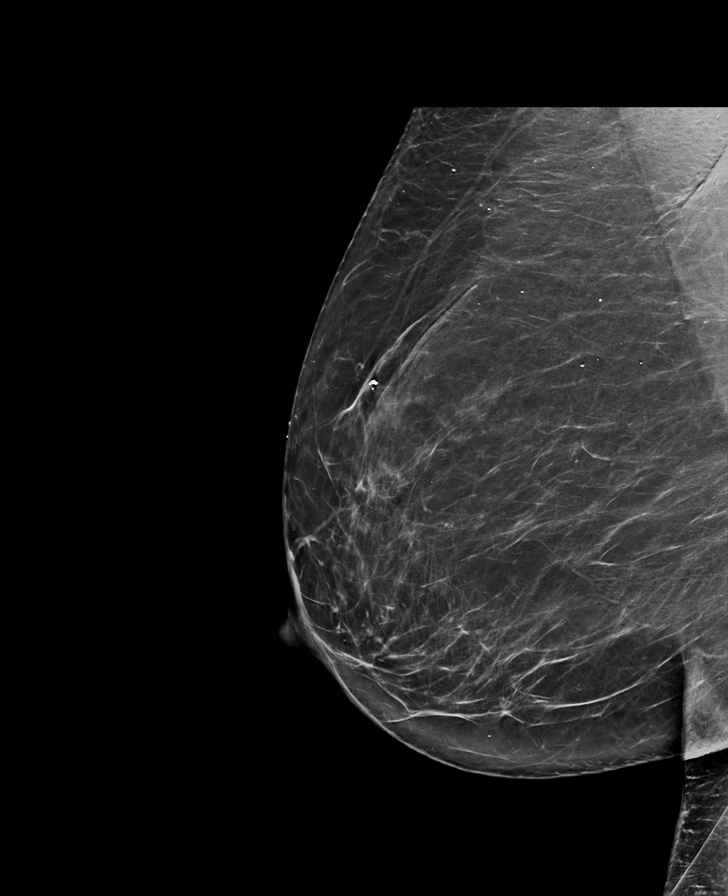

[L MLO synth-2D]
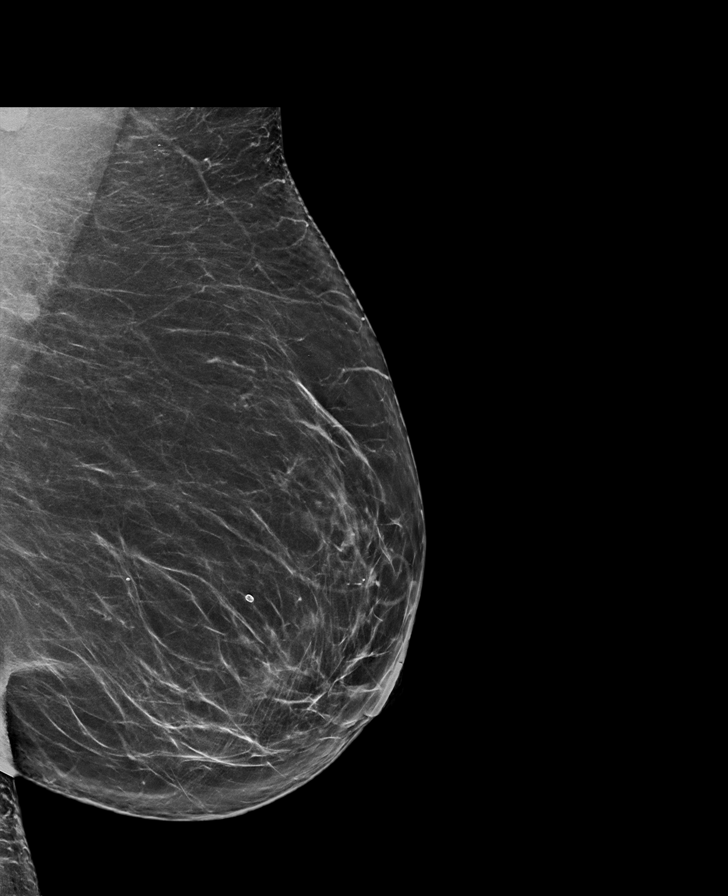

[R CC synth-2D]
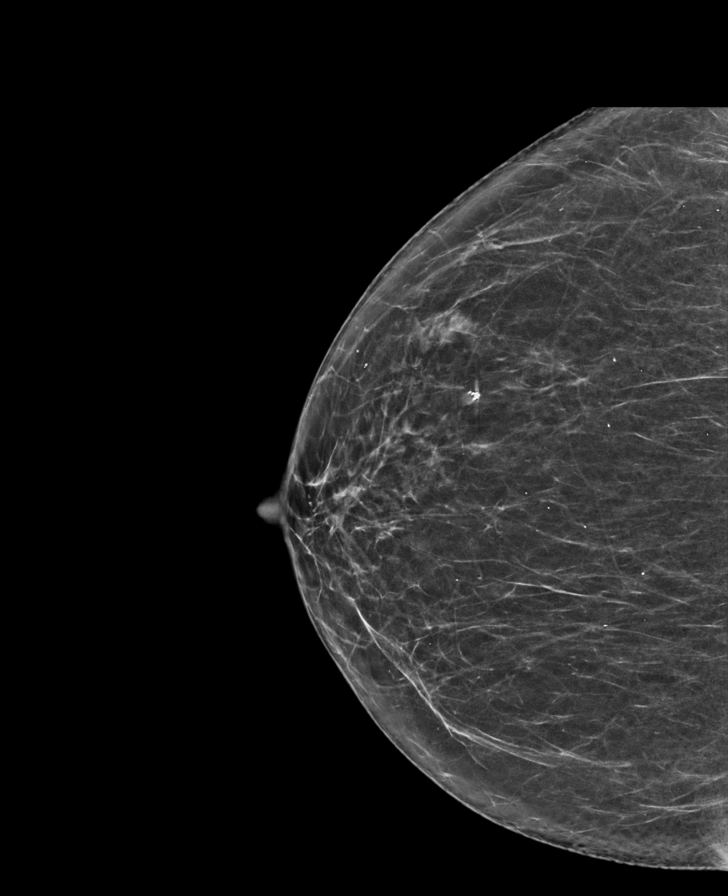

[L CC synth-2D]
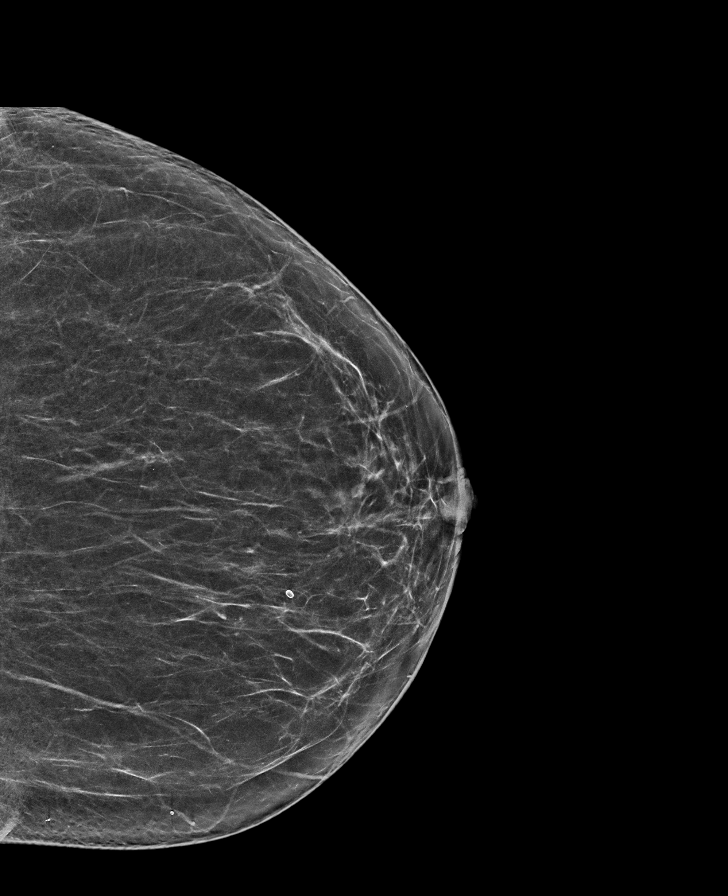

[L MLO tomo · tomo slice 41/81.0]
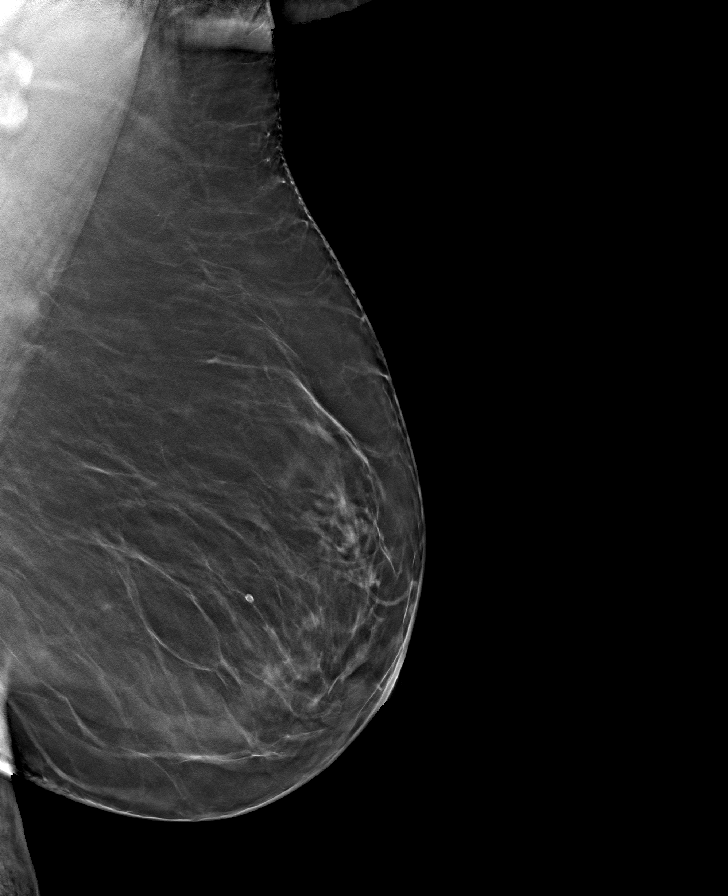

[L CC tomo · tomo slice 35/70.0]
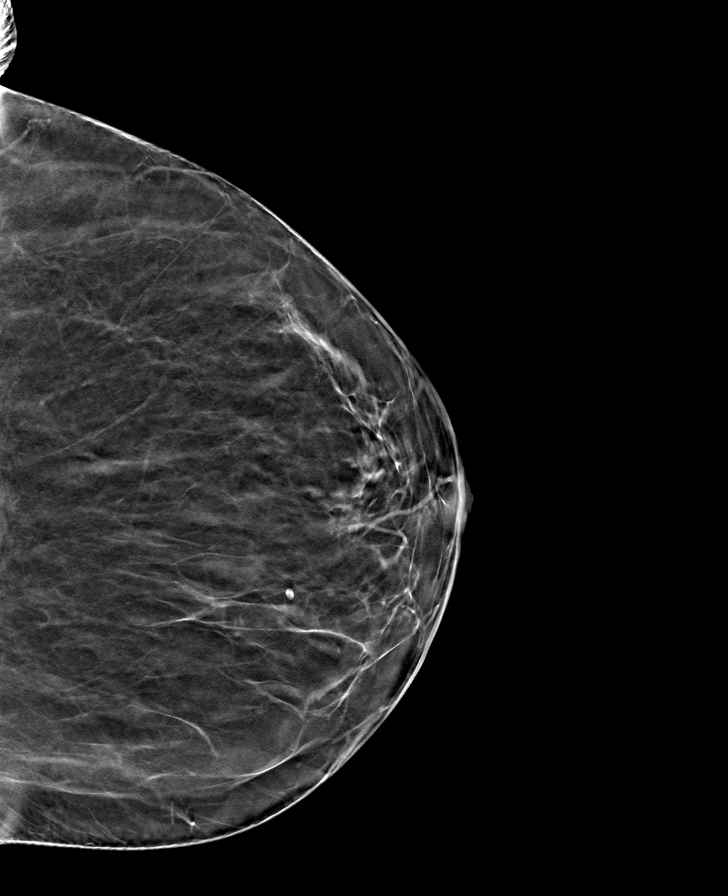

[R MLO tomo · tomo slice 42/83.0]
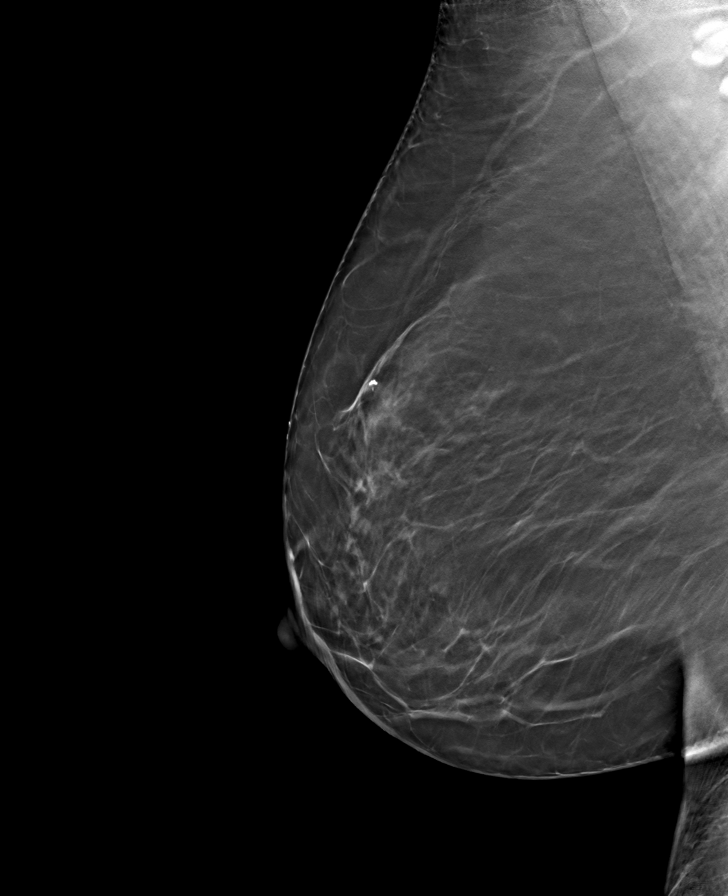

[R CC tomo · tomo slice 34/67.0]
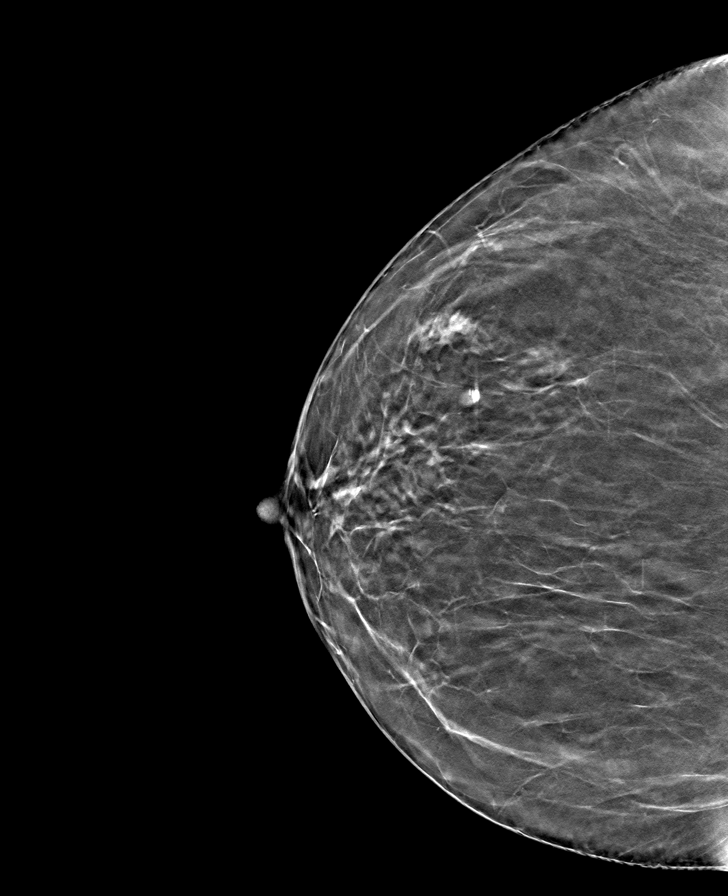

[8 of 24 positions shown; findings below may reference images not displayed]

ACR Breast Density Category b: There are scattered areas of
fibroglandular density.
FINDINGS: There are no findings suspicious for malignancy.
IMPRESSION: No mammographic evidence of malignancy. A result letter of this
screening mammogram will be mailed directly to the patient.

RECOMMENDATION:
Screening mammogram in one year. (Code:51-O-LD2)

BI-RADS CATEGORY  1: Negative.

## 2023-08-23 ENCOUNTER — Other Ambulatory Visit: Payer: Self-pay

## 2023-08-23 DIAGNOSIS — Z1231 Encounter for screening mammogram for malignant neoplasm of breast: Secondary | ICD-10-CM

## 2023-09-06 ENCOUNTER — Ambulatory Visit

## 2023-09-12 ENCOUNTER — Ambulatory Visit
Admission: RE | Admit: 2023-09-12 | Discharge: 2023-09-12 | Disposition: A | Discharge status: Home / Self Care | Source: Ambulatory Visit

## 2023-09-12 DIAGNOSIS — Z1231 Encounter for screening mammogram for malignant neoplasm of breast: Secondary | ICD-10-CM

## 2024-01-22 ENCOUNTER — Encounter: Payer: Self-pay | Admitting: Radiology
